# Patient Record
Sex: Male | Born: 1950
Health system: Southern US, Community
[De-identification: ages and names within clinical notes are randomized; demographics above are authoritative.]

## PROBLEM LIST (undated history)

## (undated) DIAGNOSIS — I10 Essential (primary) hypertension: Secondary | ICD-10-CM

## (undated) DIAGNOSIS — M19049 Primary osteoarthritis, unspecified hand: Secondary | ICD-10-CM

## (undated) DIAGNOSIS — E785 Hyperlipidemia, unspecified: Secondary | ICD-10-CM

## (undated) DIAGNOSIS — Z9889 Other specified postprocedural states: Secondary | ICD-10-CM

## (undated) DIAGNOSIS — T4145XA Adverse effect of unspecified anesthetic, initial encounter: Secondary | ICD-10-CM

## (undated) DIAGNOSIS — H9319 Tinnitus, unspecified ear: Secondary | ICD-10-CM

## (undated) DIAGNOSIS — T8859XA Other complications of anesthesia, initial encounter: Secondary | ICD-10-CM

## (undated) DIAGNOSIS — N529 Male erectile dysfunction, unspecified: Secondary | ICD-10-CM

## (undated) DIAGNOSIS — M6530 Trigger finger, unspecified finger: Secondary | ICD-10-CM

## (undated) DIAGNOSIS — Z87442 Personal history of urinary calculi: Secondary | ICD-10-CM

## (undated) DIAGNOSIS — F988 Other specified behavioral and emotional disorders with onset usually occurring in childhood and adolescence: Secondary | ICD-10-CM

## (undated) HISTORY — DX: Primary osteoarthritis, unspecified hand: M19.049

## (undated) HISTORY — DX: Other specified postprocedural states: Z98.890

## (undated) HISTORY — DX: Essential (primary) hypertension: I10

## (undated) HISTORY — PX: COLONOSCOPY: SHX5424

## (undated) HISTORY — DX: Other specified behavioral and emotional disorders with onset usually occurring in childhood and adolescence: F98.8

## (undated) HISTORY — DX: Tinnitus, unspecified ear: H93.19

## (undated) HISTORY — DX: Hyperlipidemia, unspecified: E78.5

## (undated) HISTORY — DX: Trigger finger, unspecified finger: M65.30

## (undated) HISTORY — DX: Male erectile dysfunction, unspecified: N52.9

---
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
## 1990-11-02 HISTORY — PX: VASECTOMY: SHX75

## 2005-04-25 ENCOUNTER — Emergency Department (HOSPITAL_COMMUNITY): Admission: EM | Admit: 2005-04-25 | Discharge: 2005-04-25 | Payer: Self-pay | Admitting: Emergency Medicine

## 2006-03-24 ENCOUNTER — Encounter: Payer: Self-pay | Admitting: Family Medicine

## 2009-01-18 ENCOUNTER — Ambulatory Visit: Payer: Self-pay | Admitting: Family Medicine

## 2009-01-18 DIAGNOSIS — I10 Essential (primary) hypertension: Secondary | ICD-10-CM

## 2009-01-18 DIAGNOSIS — E785 Hyperlipidemia, unspecified: Secondary | ICD-10-CM

## 2009-01-18 DIAGNOSIS — M6530 Trigger finger, unspecified finger: Secondary | ICD-10-CM

## 2009-01-18 HISTORY — DX: Hyperlipidemia, unspecified: E78.5

## 2009-01-18 HISTORY — DX: Essential (primary) hypertension: I10

## 2009-01-18 HISTORY — DX: Trigger finger, unspecified finger: M65.30

## 2009-01-22 ENCOUNTER — Telehealth: Payer: Self-pay | Admitting: Family Medicine

## 2009-05-23 ENCOUNTER — Telehealth: Payer: Self-pay | Admitting: Family Medicine

## 2009-06-26 ENCOUNTER — Ambulatory Visit: Payer: Self-pay | Admitting: Family Medicine

## 2009-06-26 DIAGNOSIS — H9319 Tinnitus, unspecified ear: Secondary | ICD-10-CM

## 2009-06-26 HISTORY — DX: Tinnitus, unspecified ear: H93.19

## 2010-01-17 ENCOUNTER — Ambulatory Visit: Payer: Self-pay | Admitting: Family Medicine

## 2010-01-17 DIAGNOSIS — F988 Other specified behavioral and emotional disorders with onset usually occurring in childhood and adolescence: Secondary | ICD-10-CM

## 2010-01-17 HISTORY — DX: Other specified behavioral and emotional disorders with onset usually occurring in childhood and adolescence: F98.8

## 2010-01-21 ENCOUNTER — Telehealth: Payer: Self-pay | Admitting: Family Medicine

## 2010-04-17 ENCOUNTER — Ambulatory Visit: Payer: Self-pay | Admitting: Family Medicine

## 2010-04-17 DIAGNOSIS — M19049 Primary osteoarthritis, unspecified hand: Secondary | ICD-10-CM

## 2010-04-17 HISTORY — DX: Primary osteoarthritis, unspecified hand: M19.049

## 2010-10-01 ENCOUNTER — Ambulatory Visit: Payer: Self-pay | Admitting: Family Medicine

## 2010-10-01 DIAGNOSIS — N529 Male erectile dysfunction, unspecified: Secondary | ICD-10-CM | POA: Insufficient documentation

## 2010-10-01 HISTORY — DX: Male erectile dysfunction, unspecified: N52.9

## 2010-11-04 ENCOUNTER — Ambulatory Visit: Admit: 2010-11-04 | Payer: Self-pay | Admitting: Family Medicine

## 2010-11-28 ENCOUNTER — Telehealth: Payer: Self-pay | Admitting: Family Medicine

## 2010-12-02 NOTE — Assessment & Plan Note (Signed)
Summary: consult re: ? ADHD/cjr   Vital Signs:  Patient profile:   60 year old male Temp:     98.3 degrees F oral BP sitting:   142 / 82  (left arm) Cuff size:   regular  Vitals Entered By: Sid Falcon LPN (January 17, 2010 1:31 PM) CC: Consultation ADHD   History of Present Illness: Patient here to discuss possible ADD issues. Since early childhood he recalls difficulty with schoolwork. He relates difficulty giving close attention to details, frequent careless mistakes, difficulty sustaining attention in tasks, difficulty listening, difficulty following through on instructions, difficulty organizing and completing tasks and frequent avoidance of tasks that require sustained mental effort. He frequently forgets things. No significant hyperactivity symptoms.  He has a job in Air[employer] and also with [employer] and increasingly having difficulty with work. He has noticed impairment in multiple settings. Hx of difficulties in school because of similar issues.  Does not does have history of high blood pressure and he has been taking losartan 50 mg though he had recent titrated on 100 mg. Most of his home blood pressures are 140 systolic. No headaches, chest pains, dizziness, or shortness of breath.  Allergies: 1)  ! Univasc (Moexipril Hcl) 2)  ! Tolectin  Past History:  Past Medical History: Last updated: 03/01/2009 Hyperlipidemia Hypertension Kidney Stones, 3 episodes Testosterone dificiency  Social History: Last updated: 01/18/2009 Occupation: Married Alcohol use-yes Smoker, not currently PMH reviewed for relevance, PSH reviewed for relevance  Review of Systems  The patient denies anorexia, fever, weight loss, weight gain, chest pain, syncope, dyspnea on exertion, peripheral edema, prolonged cough, headaches, hemoptysis, abdominal pain, melena, hematochezia, severe indigestion/heartburn, and muscle weakness.    Physical Exam  General:  Well-developed,well-nourished,in no  acute distress; alert,appropriate and cooperative throughout examination Mouth:  Oral mucosa and oropharynx without lesions or exudates.  Teeth in good repair. Neck:  No deformities, masses, or tenderness noted. Lungs:  Normal respiratory effort, chest expands symmetrically. Lungs are clear to auscultation, no crackles or wheezes. Heart:  Normal rate and regular rhythm. S1 and S2 normal without gallop, murmur, click, rub or other extra sounds. Extremities:  No clubbing, cyanosis, edema, or deformity noted with normal full range of motion of all joints.   Cervical Nodes:  No lymphadenopathy noted Psych:  normally interactive, good eye contact, not anxious appearing, and not depressed appearing.     Impression & Recommendations:  Problem # 1:  ADD (ICD-314.00) Assessment New Long discussion with patient regarding options. He meets DSM criteria for adult ADD. We discussed possible benefits as well as possible limitations and risk factors with treatment. We have to be extremely cautious given his prior history of elevated blood pressure though currently controlled. Agree to a trial of generic Adderall with very close followup in one month to reassess.  Problem # 2:  HYPERTENSION (ICD-401.9) Assessment: Unchanged  His updated medication list for this problem includes:    Losartan Potassium 100 Mg Tabs (Losartan potassium) ..... One by mouth once daily  Complete Medication List: 1)  Cialis 20 Mg Tabs (Tadalafil) .... One tab by mouth every other day 2)  Losartan Potassium 100 Mg Tabs (Losartan potassium) .... One by mouth once daily 3)  Adderall Xr 20 Mg Xr24h-cap (Amphetamine-dextroamphetamine) .... One by mouth once daily  Patient Instructions: 1)  Increase losartan to 100 mg daily 2)  Please schedule a follow-up appointment in 1 month.  Prescriptions: ADDERALL XR 20 MG XR24H-CAP (AMPHETAMINE-DEXTROAMPHETAMINE) one by mouth once daily  #30  x 0   Entered and Authorized by:   Evelena Peat MD   Signed by:   Evelena Peat MD on 01/17/2010   Method used:   Print then Give to Patient   RxID:   [id number] CIALIS 20 MG TABS (TADALAFIL) one tab by mouth every other day  #30 x 6   Entered and Authorized by:   Evelena Peat MD   Signed by:   Evelena Peat MD on 01/17/2010   Method used:   [facility]  [place] [address] [facility]       [address]       [place], [place]  [address]       Ph: [phone] or [phone]       Fax: [phone]   RxID:   [id number]

## 2010-12-02 NOTE — Letter (Signed)
Summary: Records from [facility] 2001 - 2007  Records from [facility] 2001 - 2007   Imported By: Maryln Gottron 03/13/2009 11:22:05  _____________________________________________________________________  External Attachment:    Type:   Image     Comment:   External Document

## 2010-12-02 NOTE — Progress Notes (Signed)
Summary: requesting cheaper Adderall  Phone Note Call from Patient Call back at [phone]   Caller: Patient---live call Summary of Call: Adderall is too expensive. Are there any cheaper alternatives? Initial call taken by: Warnell Forester,  January 21, 2010 12:36 PM  Follow-up for Phone Call        We need to make sure he got generic Adderall XR.  If generic Adderall XR is too expensive ANY of the other extended release meds for ADD would be similar cost.  The least expensive option is non- extended release Adderall but this would require at least twice daily dosing. Follow-up by: Evelena Peat MD,  January 21, 2010 3:15 PM  Additional Follow-up for Phone Call Additional follow up Details #1::        Pt informed of above on personally identified VM Additional Follow-up by: Sid Falcon LPN,  January 22, 2010 9:43 AM

## 2010-12-02 NOTE — Assessment & Plan Note (Signed)
Summary: hand pain//ccm   Vital Signs:  Patient profile:   60 year old male Weight:      200 pounds Temp:     98.2 degrees F oral BP sitting:   162 / 92  (left arm) Cuff size:   large  Vitals Entered By: Sid Falcon LPN (October 01, 2010 8:49 AM)  Serial Vital Signs/Assessments:  Time      Position  BP       Pulse  Resp  Temp     By                     148/88                         Evelena Peat MD   History of Present Illness: Here for the following issues.  Progressive right hand pain for several months. Pain mostly DIP, PIP and occasionally MCP joints ring and middle finger. No redness or warmth. Associated stiffness. Has taken Mobic but did not seem to be very effective. No history of generalized arthralgias.  Hypertension poorly controlled home readings with several systolics from 150. Diastolics in the 90s. Takes losartan regularly.  Erectile dysfunction. Takes Cialis but difficulty acquiring secondary to cost  Hypertension History:      He denies headache, chest pain, palpitations, dyspnea with exertion, orthopnea, PND, peripheral edema, visual symptoms, neurologic problems, syncope, and side effects from treatment.        Positive major cardiovascular risk factors include male age 29 years old or older, hyperlipidemia, and hypertension.     Allergies: 1)  ! Univasc (Moexipril Hcl) 2)  ! Tolectin 3)  Meloxicam (Meloxicam)  Past History:  Past Medical History: Last updated: 03/01/2009 Hyperlipidemia Hypertension Kidney Stones, 3 episodes Testosterone dificiency  Past Surgical History: Last updated: 03/01/2009 Appendectomy 1969 vasectomy 1992  Family History: Last updated: 01/18/2009 Family History of Arthritis  grandparent Family History High cholesterol  parent, grandparent Family History Hypertension  grandparent Family History of Sudden Death  grandparent  Social History: Last updated: 01/18/2009 Occupation: Married Alcohol  use-yes Smoker, not currently  Review of Systems  The patient denies anorexia, fever, weight loss, weight gain, chest pain, syncope, dyspnea on exertion, peripheral edema, prolonged cough, headaches, hemoptysis, abdominal pain, melena, hematochezia, severe indigestion/heartburn, and muscle weakness.    Physical Exam  General:  Well-developed,well-nourished,in no acute distress; alert,appropriate and cooperative throughout examination Mouth:  Oral mucosa and oropharynx without lesions or exudates.  Teeth in good repair. Neck:  No deformities, masses, or tenderness noted. Lungs:  Normal respiratory effort, chest expands symmetrically. Lungs are clear to auscultation, no crackles or wheezes. Heart:  Normal rate and regular rhythm. S1 and S2 normal without gallop, murmur, click, rub or other extra sounds. Extremities:  patient has some thickening around the joints DIP and PIP joint third and fourth digits right hand. No redness or warmth. Neurologic:  strength normal in all extremities.   Skin:  no suspicious lesions.   Cervical Nodes:  No lymphadenopathy noted   Impression & Recommendations:  Problem # 1:  HYPERTENSION (ICD-401.9) Assessment Deteriorated changed to losartan HCTZ and reassess one month His updated medication list for this problem includes:    Losartan Potassium-hctz 100-12.5 Mg Tabs (Losartan potassium-hctz) ..... One by mouth once daily  Problem # 2:  OSTEOARTHRITIS, HAND (ICD-715.94)  plain x-rays to assess further The following medications were removed from the medication list:    Meloxicam 15 Mg Tabs (  Meloxicam) ..... One by mouth once daily as needed pain His updated medication list for this problem includes:    Diclofenac Sodium 75 Mg Tbec (Diclofenac sodium) ..... One by mouth two times a day with food as needed pain  Orders: T-Hand Right 3 views (73130TC)  Problem # 3:  IMPOTENCE OF ORGANIC ORIGIN (ICD-607.84) samples Cialis 2.5 mg daily given His updated  medication list for this problem includes:    Cialis 20 Mg Tabs (Tadalafil) ..... One tab by mouth every other day  Complete Medication List: 1)  Cialis 20 Mg Tabs (Tadalafil) .... One tab by mouth every other day 2)  Losartan Potassium-hctz 100-12.5 Mg Tabs (Losartan potassium-hctz) .... One by mouth once daily 3)  Diclofenac Sodium 75 Mg Tbec (Diclofenac sodium) .... One by mouth two times a day with food as needed pain  Hypertension Assessment/Plan:      The patient's hypertensive risk group is category B: At least one risk factor (excluding diabetes) with no target organ damage.  Today's blood pressure is 162/92.    Patient Instructions: 1)  Please schedule a follow-up appointment in 1 month.  Prescriptions: DICLOFENAC SODIUM 75 MG TBEC (DICLOFENAC SODIUM) one by mouth two times a day with food as needed pain  #60 x 0   Entered and Authorized by:   Evelena Peat MD   Signed by:   Evelena Peat MD on 10/01/2010   Method used:   [facility]  [place] [address] [facility]       [address]       [place], [place]  [address]       Ph: [phone] or [phone]       Fax: [phone]   RxID:   [id number] LOSARTAN POTASSIUM-HCTZ 100-12.5 MG TABS (LOSARTAN POTASSIUM-HCTZ) one by mouth once daily  #30 x 5   Entered and Authorized by:   Evelena Peat MD   Signed by:   Evelena Peat MD on 10/01/2010   Method used:   [facility]  [place] [address] [facility]       [address]       [place], [place]  [address]       Ph: [phone] or [phone]       Fax: [phone]   RxID:   [id number]    Orders Added: 1)  T-Hand Right 3 views [73130TC] 2)  Est. Patient Level IV [[address]]

## 2010-12-02 NOTE — Assessment & Plan Note (Signed)
Summary: hand pain//ccm   Vital Signs:  Patient profile:   60 year old male Height:      69 inches Weight:      225 pounds BMI:     33.35 Temp:     98.3 degrees F oral BP sitting:   150 / 90  (left arm) Cuff size:   regular CC: Jon Spencer, to establish here, CPX   History of Present Illness: Patient is a pleasant 60 year old gentleman seen as a new patient.  He is right-hand-dominant and is seen with right hand pain. More specifically, he is having some pain involving the index and ring fingers. He has history of trigger finger and this pain is similar. Sometimes the index and ring fingers have to be manually opened.  No recent injury. He has responded favorably to corticosteroid injections in the past  and would like to proceed similarly at this time.   No wrist pain.    Past History:  Past Medical History:    Hyperlipidemia    Hypertension    Kidney Stones, 3 episodes  Past Surgical History:    Appendectomy 1969  Family History:    Family History of Arthritis  grandparent    Family History High cholesterol  parent, grandparent    Family History Hypertension  grandparent    Family History of Sudden Death  grandparent  Social History:    Occupation:    Married    Alcohol use-yes    Smoker, not currently    Occupation:  employed  Review of Systems       patient denies any numbness or weakness on involving the hand.  Physical Exam  General:  patient alert in no distress Heart:  regular rhythm and rate Msk:  right hand is examined. No visible edema or erythema. He has fairly fluid range of motion all joints of the hand. He does have some tenderness in the palm just proximal to the MCP joint of the ring and index finger. There some mild thickening compatible with trigger finger. No evidence for joint inflammation.   Impression & Recommendations:  Problem # 1:  TRIGGER FINGER (ICD-727.03) Trigger fingers involving ring and index fingers of right hand.  After  going over risks and benefits of corticosteroid injection of tendon sheath of involved digits, patient wished to proceed.  No complications with injection.  Consider orthopedic referral if no better in 2 weeks. Orders: Injection, Tendon / Ligament ([address]) Depo- Medrol 80mg  (J1040)  Patient Instructions: 1)  Be in touch if hand pain is not improved over the next week or 2 following injection. 2)  The medication list was reviewed and reconciled.  All changed / newly prescribed medications were explained.  A complete medication list was provided to the patient / caregiver.      Preventive Care Screening  Last Tetanus Booster:    Date:  11/03/2007    Results:  Td   Colonoscopy:    Date:  11/02/2005    Results:  normal

## 2010-12-02 NOTE — Assessment & Plan Note (Signed)
Summary: SEVERE PAIN IN RT HAND FOR 2-3 MONTHS/CJR   Vital Signs:  Patient profile:   60 year old male Temp:     98.7 degrees F oral BP sitting:   150 / 94  (left arm) Cuff size:   large  Vitals Entered By: Sid Falcon LPN (April 17, 2010 4:14 PM) CC: Right hand pain   History of Present Illness: Patient seen with right hand pain past 2-3 months. Pain especially right index and middle finger mostly PIP joint. No injury. He is ambidextrous. Occasional mild swelling. Ibuprofen helps. No injury.  Pain of moderate severity.  No other arthralgias.  Allergies: 1)  ! Univasc (Moexipril Hcl) 2)  ! Tolectin  Past History:  Past Medical History: Last updated: 03/01/2009 Hyperlipidemia Hypertension Kidney Stones, 3 episodes Testosterone dificiency  Review of Systems  The patient denies fever, weight loss, peripheral edema, and muscle weakness.    Physical Exam  General:  Well-developed,well-nourished,in no acute distress; alert,appropriate and cooperative throughout examination Lungs:  Normal respiratory effort, chest expands symmetrically. Lungs are clear to auscultation, no crackles or wheezes. Heart:  normal rate and regular rhythm.   Extremities:  no warmth erythema or visible swelling of the hand or wrist. He has some mild tenderness of the PIP joints of the second and third digits. Minimal tenderness second digit MCP joint. He has trigger finger involving the second digit.  Mild nodular changes DIP and PIP joints of both hands diffusely.   Impression & Recommendations:  Problem # 1:  OSTEOARTHRITIS, HAND (ICD-715.94) reported prior allergy to Tolectin but patient has been able to take Advil without difficulty His updated medication list for this problem includes:    Meloxicam 15 Mg Tabs (Meloxicam) ..... One by mouth once daily as needed pain  Complete Medication List: 1)  Cialis 20 Mg Tabs (Tadalafil) .... One tab by mouth every other day 2)  Losartan Potassium 100 Mg  Tabs (Losartan potassium) .... One by mouth once daily 3)  Adderall Xr 20 Mg Xr24h-cap (Amphetamine-dextroamphetamine) .... One by mouth once daily 4)  Meloxicam 15 Mg Tabs (Meloxicam) .... One by mouth once daily as needed pain  Patient Instructions: 1)  be in touch if pain is not improving over the next couple of weeks with medication Prescriptions: MELOXICAM 15 MG TABS (MELOXICAM) one by mouth once daily as needed pain  #30 x 3   Entered and Authorized by:   Evelena Peat MD   Signed by:   Evelena Peat MD on 04/17/2010   Method used:   [facility]        [facility]  [place] [address] [facility]       [address]       [place], [place]  [address]       Ph: [phone] or [phone]       Fax: [phone]   RxID:   [id number]

## 2010-12-02 NOTE — Progress Notes (Signed)
Summary: Cialis rx request  Phone Note From Pharmacy   Caller: [facility] Call For: Jon Spencer  Summary of Call: Faxed request for refill on Cialis 20mg , take one tab by mouth  [facility] every other day, #30  Initial call taken by: Sid Falcon LPN,  January 22, 2009 8:30 AM  Follow-up for Phone Call        OK to refill for 1 year Follow-up by: Evelena Peat MD,  January 22, 2009 8:34 AM  Additional Follow-up for Phone Call Additional follow up Details #1::        Rx sent electronically to [facility] Additional Follow-up by: Sid Falcon LPN,  January 22, 2009 12:41 PM    New/Updated Medications: CIALIS 20 MG TABS (TADALAFIL) one tab by mouth every other day   Prescriptions: CIALIS 20 MG TABS (TADALAFIL) one tab by mouth every other day  #30 x 6   Entered by:   Sid Falcon LPN   Authorized by:   Evelena Peat MD   Signed by:   Sid Falcon LPN on 16/08/9603   Method used:   [facility]        [facility]  [place] [address]       [address]       [place], [place]  [address]       Ph: [phone] or [phone]       Fax: [phone]   RxID:   [id number]

## 2010-12-04 NOTE — Progress Notes (Signed)
Summary: wants a zpack, denied  Phone Note Call from Patient Call back at [phone]   Caller: Patient Call For: burchette Summary of Call: pt was exposed to flu this morning, he had breakfast with a friend and gave her a hug.  He wants a zpack called in to [facility] so he doesn't get it Initial call taken by: Alfred Levins, CMA,  November 28, 2010 2:35 PM  Follow-up for Phone Call        If she had flu this (Z-max ) will not help to avoid.  If she had been tested and confirmed with Flu Tamiflu 75 mg two times a day for 5 days would help to prophylax. Follow-up by: Evelena Peat MD,  November 28, 2010 3:30 PM  Additional Follow-up for Phone Call Additional follow up Details #1::        LMTCB  Alfred Levins, CMA  November 28, 2010 4:52 PM    Additional Follow-up for Phone Call Additional follow up Details #2::    Pt informed on personally identified VM Follow-up by: Sid Falcon LPN,  November 28, 2010 5:12 PM

## 2011-01-06 ENCOUNTER — Other Ambulatory Visit: Payer: Self-pay | Admitting: *Deleted

## 2011-01-06 DIAGNOSIS — N529 Male erectile dysfunction, unspecified: Secondary | ICD-10-CM

## 2011-01-06 NOTE — Telephone Encounter (Signed)
VM from pt requesting a change from Cialis 20 mg, one tab every other day back to Levitra. He stated he had been on it before, however I do not see record of it in EMR.   Please send to [facility]

## 2011-01-06 NOTE — Telephone Encounter (Signed)
OK to d/c Cialis and start Levitra 20 mg po every other day prn and refill for 6 months, #6

## 2011-01-07 MED ORDER — VARDENAFIL HCL 20 MG PO TABS: 20.0000 mg | ORAL_TABLET | ORAL | Status: DC | PRN

## 2011-03-20 ENCOUNTER — Encounter: Payer: Self-pay | Admitting: Family Medicine

## 2011-03-20 ENCOUNTER — Ambulatory Visit (INDEPENDENT_AMBULATORY_CARE_PROVIDER_SITE_OTHER): Payer: BC Managed Care – PPO | Admitting: Family Medicine

## 2011-03-20 VITALS — BP 142/78 | Temp 98.7°F | Ht 69.0 in | Wt 200.0 lb

## 2011-03-20 DIAGNOSIS — E349 Endocrine disorder, unspecified: Secondary | ICD-10-CM

## 2011-03-20 DIAGNOSIS — E291 Testicular hypofunction: Secondary | ICD-10-CM

## 2011-03-20 DIAGNOSIS — R5383 Other fatigue: Secondary | ICD-10-CM

## 2011-03-20 DIAGNOSIS — R5381 Other malaise: Secondary | ICD-10-CM

## 2011-03-20 DIAGNOSIS — M65312 Trigger thumb, left thumb: Secondary | ICD-10-CM

## 2011-03-20 DIAGNOSIS — M6530 Trigger finger, unspecified finger: Secondary | ICD-10-CM

## 2011-03-20 NOTE — Progress Notes (Signed)
  Subjective:    Patient ID: Jon Spencer, male    DOB: 10-24-1951, 60 y.o.   MRN: [id number]  HPI Patient sent the following issues. Left thumb pain. 2 weeks ago was doing a lot of gardening. Pain started then. No injury. Pain is at the base of the thumb, volar surface.  Has noticed trigger finger. Similar problem with right hand past improved with corticosteroid injection. No weakness or numbness. No wrist or hand pain. Has taken Aleve without much improvement.  Hypertension treated with losartan Hctz.  Blood pressures been fairly well controlled by home readings. No dizziness. Compliant with therapy.  History of hypo-testosterone.  Previously treated with injection several years ago but lost insurance. He would like to consider going back on injection this time. Also family history of hypothyroidism. He request levels. No constipation. Weight is stable. No cold intolerance.   Review of Systems  Constitutional: Positive for fatigue. Negative for fever, chills, appetite change and unexpected weight change.  Respiratory: Negative for cough and shortness of breath.   Cardiovascular: Negative for chest pain, palpitations and leg swelling.  Gastrointestinal: Negative for abdominal pain.  Neurological: Negative for dizziness, syncope, weakness and headaches.  Hematological: Negative for adenopathy.       Objective:   Physical Exam  Constitutional: He appears well-developed and well-nourished.  HENT:  Head: Normocephalic and atraumatic.  Right Ear: External ear normal.  Left Ear: External ear normal.  Mouth/Throat: Oropharynx is clear and moist.  Cardiovascular: Normal rate, regular rhythm and normal heart sounds.   Musculoskeletal: He exhibits no edema.       Left thumb reveals tender nodule at the base of digit. He is tenderness in this region. No erythema or warmth. No skin rash. Full range of motion interphalangeal joint. Full range of motion wrists.          Assessment & Plan:   #1 left trigger thumb.  No relief with nonsteroidals. Discussed risks and benefits of corticosteroid injection patient consented. Using 25-gauge 5/8 inch needle injected approximately 20 mg Depo-Medrol into region of tender nodule (into tendon sheath parallel to nodule and tendon). Patient tolerated well. Touch base 2 weeks if no better #2 fatigue with history of low testosterone. He'll return for early morning testosterone and TSH levels

## 2011-03-24 ENCOUNTER — Other Ambulatory Visit: Payer: Self-pay | Admitting: Family Medicine

## 2011-03-24 ENCOUNTER — Other Ambulatory Visit (INDEPENDENT_AMBULATORY_CARE_PROVIDER_SITE_OTHER): Payer: BC Managed Care – PPO | Admitting: Family Medicine

## 2011-03-24 DIAGNOSIS — R5383 Other fatigue: Secondary | ICD-10-CM

## 2011-03-24 DIAGNOSIS — R5381 Other malaise: Secondary | ICD-10-CM

## 2011-03-24 NOTE — Telephone Encounter (Signed)
Needs status of a refill that was supposed to have been done last week. Wants to speak with Harriett Sine first.

## 2011-04-01 ENCOUNTER — Telehealth: Payer: Self-pay | Admitting: Family Medicine

## 2011-04-01 NOTE — Telephone Encounter (Signed)
Pt needs blood work results °

## 2011-04-02 LAB — TESTOSTERONE: Testosterone: 155.44 ng/dL — ABNORMAL LOW (ref 350.00–890.00)

## 2011-04-02 LAB — TSH: TSH: 1.1 u[IU]/mL (ref 0.35–5.50)

## 2011-04-02 NOTE — Telephone Encounter (Signed)
I called pt, let him know we are working on getting results, not sure what the lab issue is.  I will bring to [facility] and Nana's attention.  Pt very unhappy with the lab service the date of lab service.  Offered to have him discuss with office manager, he said no.

## 2011-04-02 NOTE — Telephone Encounter (Signed)
I do not see results.  Can we find out where his labs are?

## 2011-04-02 NOTE — Telephone Encounter (Signed)
Per Dr Caryl Never, please have Gwenn call pt and explain error in collection, in computer it appears to not have been collected, however pt states he was at lab and collection was done. Gwenn called pt, explained what happened and apologized for error, Pt is coming back in for stat re-draw today. FYI

## 2011-04-02 NOTE — Telephone Encounter (Signed)
Pt requested labs results.

## 2011-04-03 NOTE — Progress Notes (Signed)
Quick Note:  LMTCB on home VM. I also tried work number, he was not working today ______

## 2011-04-07 NOTE — Progress Notes (Signed)
Quick Note:  Pt informed and he would like to begin the testosterone inj again. ______

## 2011-04-08 ENCOUNTER — Telehealth: Payer: Self-pay | Admitting: *Deleted

## 2011-04-08 MED ORDER — TESTOSTERONE CYPIONATE 200 MG/ML IM SOLN: 200.0000 mg | INTRAMUSCULAR | Status: DC

## 2011-04-08 NOTE — Telephone Encounter (Signed)
Pt informed on work VM med will be called in, please call for nurse visit for injection

## 2011-04-08 NOTE — Progress Notes (Signed)
Quick Note:  Pt informed on work VM, will call in med ______

## 2011-04-10 ENCOUNTER — Encounter: Payer: Self-pay | Admitting: Family Medicine

## 2011-04-10 DIAGNOSIS — E291 Testicular hypofunction: Secondary | ICD-10-CM | POA: Insufficient documentation

## 2011-04-17 ENCOUNTER — Other Ambulatory Visit: Payer: Self-pay | Admitting: Family Medicine

## 2011-05-01 ENCOUNTER — Ambulatory Visit: Payer: BC Managed Care – PPO | Admitting: Family Medicine

## 2011-05-01 DIAGNOSIS — Z8639 Personal history of other endocrine, nutritional and metabolic disease: Secondary | ICD-10-CM

## 2011-05-01 MED ORDER — TESTOSTERONE CYPIONATE 200 MG/ML IM SOLN: 200.0000 mg | INTRAMUSCULAR | Status: DC

## 2011-06-02 ENCOUNTER — Ambulatory Visit: Payer: BC Managed Care – PPO | Admitting: Family Medicine

## 2011-06-05 ENCOUNTER — Ambulatory Visit (INDEPENDENT_AMBULATORY_CARE_PROVIDER_SITE_OTHER): Payer: BC Managed Care – PPO | Admitting: Family Medicine

## 2011-06-05 DIAGNOSIS — Z8639 Personal history of other endocrine, nutritional and metabolic disease: Secondary | ICD-10-CM

## 2011-06-05 DIAGNOSIS — Z862 Personal history of diseases of the blood and blood-forming organs and certain disorders involving the immune mechanism: Secondary | ICD-10-CM

## 2011-06-05 MED ORDER — TESTOSTERONE CYPIONATE 200 MG/ML IM SOLN
200.0000 mg | INTRAMUSCULAR | Status: DC
  Administered 2011-06-05: 200 mg via INTRAMUSCULAR

## 2011-07-07 ENCOUNTER — Ambulatory Visit: Payer: BC Managed Care – PPO | Admitting: Family Medicine

## 2011-07-08 ENCOUNTER — Ambulatory Visit (INDEPENDENT_AMBULATORY_CARE_PROVIDER_SITE_OTHER): Payer: BC Managed Care – PPO | Admitting: Family Medicine

## 2011-07-08 DIAGNOSIS — N529 Male erectile dysfunction, unspecified: Secondary | ICD-10-CM

## 2011-07-08 MED ORDER — TESTOSTERONE CYPIONATE 200 MG/ML IM SOLN
200.0000 mg | INTRAMUSCULAR | Status: DC
  Administered 2011-07-08: 200 mg via INTRAMUSCULAR

## 2011-08-05 ENCOUNTER — Ambulatory Visit (INDEPENDENT_AMBULATORY_CARE_PROVIDER_SITE_OTHER): Payer: BC Managed Care – PPO | Admitting: Family Medicine

## 2011-08-05 DIAGNOSIS — Z8639 Personal history of other endocrine, nutritional and metabolic disease: Secondary | ICD-10-CM

## 2011-08-05 DIAGNOSIS — Z862 Personal history of diseases of the blood and blood-forming organs and certain disorders involving the immune mechanism: Secondary | ICD-10-CM

## 2011-08-05 MED ORDER — TESTOSTERONE CYPIONATE 200 MG/ML IM SOLN
200.0000 mg | INTRAMUSCULAR | Status: DC
  Administered 2011-08-05: 200 mg via INTRAMUSCULAR

## 2011-09-11 ENCOUNTER — Ambulatory Visit: Payer: BC Managed Care – PPO | Admitting: Family Medicine

## 2011-09-15 ENCOUNTER — Ambulatory Visit: Payer: BC Managed Care – PPO | Admitting: *Deleted

## 2011-09-16 ENCOUNTER — Ambulatory Visit: Payer: BC Managed Care – PPO | Admitting: *Deleted

## 2011-09-17 ENCOUNTER — Ambulatory Visit (INDEPENDENT_AMBULATORY_CARE_PROVIDER_SITE_OTHER): Payer: BC Managed Care – PPO | Admitting: *Deleted

## 2011-09-17 DIAGNOSIS — Z8639 Personal history of other endocrine, nutritional and metabolic disease: Secondary | ICD-10-CM

## 2011-09-17 DIAGNOSIS — Z862 Personal history of diseases of the blood and blood-forming organs and certain disorders involving the immune mechanism: Secondary | ICD-10-CM

## 2011-09-17 MED ORDER — TESTOSTERONE CYPIONATE 200 MG/ML IM SOLN
200.0000 mg | INTRAMUSCULAR | Status: DC
  Administered 2011-09-17: 200 mg via INTRAMUSCULAR

## 2011-10-02 ENCOUNTER — Encounter: Payer: Self-pay | Admitting: Family Medicine

## 2011-10-02 ENCOUNTER — Ambulatory Visit (INDEPENDENT_AMBULATORY_CARE_PROVIDER_SITE_OTHER): Payer: BC Managed Care – PPO | Admitting: Family Medicine

## 2011-10-02 VITALS — BP 152/92 | Temp 98.0°F | Wt 198.0 lb

## 2011-10-02 DIAGNOSIS — I10 Essential (primary) hypertension: Secondary | ICD-10-CM

## 2011-10-02 DIAGNOSIS — M65319 Trigger thumb, unspecified thumb: Secondary | ICD-10-CM

## 2011-10-02 DIAGNOSIS — N400 Enlarged prostate without lower urinary tract symptoms: Secondary | ICD-10-CM

## 2011-10-02 DIAGNOSIS — M6530 Trigger finger, unspecified finger: Secondary | ICD-10-CM

## 2011-10-02 DIAGNOSIS — E291 Testicular hypofunction: Secondary | ICD-10-CM

## 2011-10-02 DIAGNOSIS — N529 Male erectile dysfunction, unspecified: Secondary | ICD-10-CM

## 2011-10-02 MED ORDER — TADALAFIL 5 MG PO TABS: 5.0000 mg | ORAL_TABLET | Freq: Every day | ORAL | Status: DC | PRN

## 2011-10-02 NOTE — Patient Instructions (Signed)
Trigger Finger Trigger finger (digital tendinitis and stenosing tenosynovitis) is a common disorder that causes an often painful catching of the fingers or thumb. It occurs as a clicking, snapping or locking of a finger in the palm of the hand. The reason for this is that there is a problem with the tendons which flex the fingers sliding smoothly through their sheaths. The cause of this may be inflammation of the tendon and sheath, or from a thickening or nodule in the tendon. The condition may occur in any finger or a couple fingers at the same time. The cause may be overuse while doing the same activity over and over again with your hands.  Tendons are the tough cords that connect the muscles to bones. Muscles and tendons are part of the system which allows your body to move. When muscles contract in the forearm on the palm side, they pull the tendons toward the elbow and cause the fingers and thumb to bend (flex) toward the palm. These are the flexor tendons. The tendons slide through a slippery smooth membrane (synovium) which is called the tendon sheath. The sheaths have areas of tough fibrous tissues surrounding them which hold the tendons close to the bone. These are called pulleys because they work like a pulley. The first pulley is in the palm of the hand near the crease which runs across your palm. If the area of the tendon thickening is near the pulley, the tendon cannot slide smoothly through the pulley and this causes the trigger finger. The finger may lock with the finger curled or suddenly straighten out with a snap. This is more common in patients with rheumatoid arthritis and diabetes. Left untreated, the condition may get worse to the point where the finger becomes locked in flexion, like making a fist, or less commonly locked with the finger straightened out. DIAGNOSIS  Your caregiver will easily make this diagnosis on examination. TREATMENT   Splinting for 6 to 8 weeks of time may be  helpful. Use the splints as your caregiver suggests.   Heat used for twenty minutes at least four times a day followed by ice packs for twenty minutes unless directed otherwise by your caregiver may be helpful. If you find either heat or cold seems to be making the problem worse, quit using them and ask your caregiver for directions.   Cortisone injections along with splinting may speed up recovery. Several injections may be required. Cortisone may give relief after one injection.   Only take over-the-counter or prescription medicines for pain, discomfort, or fever as directed by your caregiver.   Surgery is another treatment that may be used if conservative treatments using injection and splinting does not work. Surgery can be minor without incisions (a cut does not have to be made) and can be done with a needle through the skin. No stitches are needed and most patients may return to work the same day.   Other surgical choices involve an open procedure where the surgeon opens the hand through a small incision (cut) and cuts the pulley so the tendon can again slide smoothly. Your hand will still work fine. This small operation requires stitches and the recovery will be a little longer and the incisions will need to be protected until completely healed. You may have to limit your activities for up to 6 months.   Occupational or hand therapy may be required if there is stiffness remaining in the finger.  RISKS AND COMPLICATIONS Complications are uncommon but  some problems that may occur are:  Recurrence of the trigger finger. This does not mean that the surgery was not well done. It simply means that you may have formed scar tissue following surgery that causes the problem to reoccur.   Infection which could ruin the results of the surgery and can result in a finger which is frozen and can not move normally.   Nerve injury is possible which could result in permanent numbness of one or more fingers.   CARE AFTER SURGERY  Elevate your hand above your heart and use ice as instructed.   Follow instructions regarding finger motion/exercise.   Keep the surgical wound dry for at least 48 hrs or longer if instructed.   Keep your follow-up appointments.   Return to work and normal activities as instructed.  SEEK IMMEDIATE MEDICAL CARE IF:  Your problems are getting worse or you do not obtain relief from the treatment. Document Released: 08/08/2004 Document Revised: 07/01/2011 Document Reviewed: 04/02/2009 [facility] Patient Information 2012 [place], [place].

## 2011-10-02 NOTE — Progress Notes (Signed)
  Subjective:    Patient ID: Jon Spencer, male    DOB: 09/06/1951, 60 y.o.   MRN: [id number]  HPI  Here to discuss several issues as follows  History of hypogonadism. Recently started testosterone injections. Overall feels greatly improved. Improved libido, improved stamina, increased ability to exercise. He has lost considerable amount of body fat already is exercising more regularly. Overall improved sense of well-being. Feels somewhat less depressed. No side effects.  Needs repeat PSA  History of frequent nocturia and slow urinary stream occasionally.  Has previously taken Cialis 20 mg for erectile dysfunction and as noted less urinary frequency and less nocturia with that. He would like to consider daily Cialis.  History of hypertension treated with losartan HCTZ. Occasionally skips doses. Blood pressures consistently 130 systolic by home readings. No headaches or dizziness.  Left thumb pain.  Trigger thumb which catches frequently. Has some pain metacarpophalangeal joint. No erythema or warmth. Has had previous trigger finger other digits which have improved with steroid injection. Requesting the same today.   Review of Systems  Constitutional: Negative for fatigue and unexpected weight change.  Eyes: Negative for visual disturbance.  Respiratory: Negative for cough, chest tightness and shortness of breath.   Cardiovascular: Negative for chest pain, palpitations and leg swelling.  Gastrointestinal: Negative for abdominal pain.  Genitourinary: Positive for frequency. Negative for hematuria.  Neurological: Negative for dizziness, syncope, weakness, light-headedness and headaches.  Psychiatric/Behavioral: Negative for dysphoric mood.       Objective:   Physical Exam  Constitutional: He is oriented to person, place, and time. He appears well-developed and well-nourished. No distress.  Neck: Neck supple.  Cardiovascular: Normal rate and regular rhythm.   Pulmonary/Chest: Effort  normal and breath sounds normal. No respiratory distress. He has no wheezes. He has no rales.  Musculoskeletal: He exhibits no edema.       Left thumb reveals no visible swelling. He has trigger thumb with tender nodule ventral aspect base of thumb. Full range of motion all joints of thumb.  Lymphadenopathy:    He has no cervical adenopathy.  Neurological: He is alert and oriented to person, place, and time.  Psychiatric: He has a normal mood and affect. His behavior is normal.          Assessment & Plan:  #1 hypogonadism. Continue testosterone injections. Needs followup testosterone level which is ordered. Also check PSA. #2 history of erectile dysfunction and some BPH-type symptoms. Start daily Cialis 5 mg #3 hypertension. Elevated today. Well controlled by home readings. Continue close monitoring #4 trigger thumb, left.  Discussed options. Discussed risk and benefits of corticosteroid injection into the vicinity of nodule and patient consented. Prepped left thumb with Betadine. Using 25-gauge 5/8 inch needle injected 20 mg Depo-Medrol into the tendon sheath parallel to nodule region. Patient tolerated well.

## 2011-10-07 ENCOUNTER — Ambulatory Visit (INDEPENDENT_AMBULATORY_CARE_PROVIDER_SITE_OTHER): Payer: BC Managed Care – PPO | Admitting: Family Medicine

## 2011-10-07 DIAGNOSIS — Z8639 Personal history of other endocrine, nutritional and metabolic disease: Secondary | ICD-10-CM

## 2011-10-07 DIAGNOSIS — Z862 Personal history of diseases of the blood and blood-forming organs and certain disorders involving the immune mechanism: Secondary | ICD-10-CM

## 2011-10-07 MED ORDER — TESTOSTERONE CYPIONATE 200 MG/ML IM SOLN
200.0000 mg | INTRAMUSCULAR | Status: DC
  Administered 2011-10-07: 200 mg via INTRAMUSCULAR

## 2011-10-09 ENCOUNTER — Ambulatory Visit: Payer: BC Managed Care – PPO | Admitting: Family Medicine

## 2011-11-05 ENCOUNTER — Ambulatory Visit (INDEPENDENT_AMBULATORY_CARE_PROVIDER_SITE_OTHER): Payer: BC Managed Care – PPO | Admitting: Family Medicine

## 2011-11-05 DIAGNOSIS — Z862 Personal history of diseases of the blood and blood-forming organs and certain disorders involving the immune mechanism: Secondary | ICD-10-CM

## 2011-11-05 DIAGNOSIS — Z8639 Personal history of other endocrine, nutritional and metabolic disease: Secondary | ICD-10-CM

## 2011-11-05 MED ORDER — TESTOSTERONE CYPIONATE 200 MG/ML IM SOLN
200.0000 mg | INTRAMUSCULAR | Status: DC
  Administered 2011-11-05: 200 mg via INTRAMUSCULAR

## 2011-12-08 ENCOUNTER — Ambulatory Visit: Payer: BC Managed Care – PPO | Admitting: Family Medicine

## 2011-12-15 ENCOUNTER — Ambulatory Visit: Payer: BC Managed Care – PPO | Admitting: Family Medicine

## 2012-01-05 ENCOUNTER — Ambulatory Visit: Payer: BC Managed Care – PPO | Admitting: Family Medicine

## 2012-01-12 ENCOUNTER — Ambulatory Visit (INDEPENDENT_AMBULATORY_CARE_PROVIDER_SITE_OTHER): Payer: BC Managed Care – PPO | Admitting: Family Medicine

## 2012-01-12 DIAGNOSIS — Z862 Personal history of diseases of the blood and blood-forming organs and certain disorders involving the immune mechanism: Secondary | ICD-10-CM

## 2012-01-12 DIAGNOSIS — Z8639 Personal history of other endocrine, nutritional and metabolic disease: Secondary | ICD-10-CM

## 2012-01-12 MED ORDER — TESTOSTERONE CYPIONATE 200 MG/ML IM SOLN
200.0000 mg | INTRAMUSCULAR | Status: DC
  Administered 2012-01-12: 200 mg via INTRAMUSCULAR

## 2012-02-04 ENCOUNTER — Ambulatory Visit (INDEPENDENT_AMBULATORY_CARE_PROVIDER_SITE_OTHER): Payer: BC Managed Care – PPO | Admitting: Family Medicine

## 2012-02-04 DIAGNOSIS — E291 Testicular hypofunction: Secondary | ICD-10-CM

## 2012-02-04 DIAGNOSIS — E349 Endocrine disorder, unspecified: Secondary | ICD-10-CM

## 2012-02-04 MED ORDER — TESTOSTERONE CYPIONATE 200 MG/ML IM SOLN
200.0000 mg | INTRAMUSCULAR | Status: DC
  Administered 2012-02-04: 200 mg via INTRAMUSCULAR

## 2012-03-04 ENCOUNTER — Ambulatory Visit: Payer: BC Managed Care – PPO | Admitting: *Deleted

## 2012-03-10 ENCOUNTER — Ambulatory Visit (INDEPENDENT_AMBULATORY_CARE_PROVIDER_SITE_OTHER): Payer: BC Managed Care – PPO | Admitting: Family Medicine

## 2012-03-10 DIAGNOSIS — E291 Testicular hypofunction: Secondary | ICD-10-CM

## 2012-03-10 DIAGNOSIS — E349 Endocrine disorder, unspecified: Secondary | ICD-10-CM

## 2012-03-10 MED ORDER — TESTOSTERONE CYPIONATE 200 MG/ML IM SOLN
200.0000 mg | Freq: Once | INTRAMUSCULAR | Status: AC
  Administered 2012-03-10: 200 mg via INTRAMUSCULAR

## 2012-04-04 ENCOUNTER — Ambulatory Visit: Payer: BC Managed Care – PPO | Admitting: Family Medicine

## 2012-04-05 ENCOUNTER — Ambulatory Visit: Payer: BC Managed Care – PPO | Admitting: Family Medicine

## 2012-04-12 ENCOUNTER — Ambulatory Visit: Payer: BC Managed Care – PPO | Admitting: Family Medicine

## 2012-04-14 ENCOUNTER — Other Ambulatory Visit: Payer: Self-pay | Admitting: Family Medicine

## 2012-04-15 ENCOUNTER — Ambulatory Visit: Payer: BC Managed Care – PPO | Admitting: Family Medicine

## 2012-05-03 ENCOUNTER — Ambulatory Visit: Payer: BC Managed Care – PPO | Admitting: Family Medicine

## 2012-05-03 ENCOUNTER — Ambulatory Visit (INDEPENDENT_AMBULATORY_CARE_PROVIDER_SITE_OTHER): Payer: BC Managed Care – PPO | Admitting: Family Medicine

## 2012-05-03 ENCOUNTER — Encounter: Payer: Self-pay | Admitting: Family Medicine

## 2012-05-03 VITALS — BP 164/100 | Temp 98.0°F | Wt 198.0 lb

## 2012-05-03 DIAGNOSIS — I10 Essential (primary) hypertension: Secondary | ICD-10-CM

## 2012-05-03 MED ORDER — AMLODIPINE BESYLATE 5 MG PO TABS: 5.0000 mg | ORAL_TABLET | Freq: Every day | ORAL | Status: DC

## 2012-05-03 NOTE — Progress Notes (Signed)
  Subjective:    Patient ID: Jon Spencer, male    DOB: May 19, 1951, 61 y.o.   MRN: [id number]  HPI  Patient was here for testosterone injection. Relates blood pressures been running high. Consistently taking losartan HCTZ. Blood pressures around 152-156 systolic and 92 diastolic. Exercising 3 times a week. No regular alcohol use. No chest pains. No dizziness. Previous cough with ACE inhibitor  Past Medical History  Diagnosis Date  . HYPERLIPIDEMIA 01/18/2009  . ADD 01/17/2010  . TINNITUS 06/26/2009  . HYPERTENSION 01/18/2009  . Impotence of organic origin 10/01/2010  . OSTEOARTHRITIS, HAND 04/17/2010  . TRIGGER FINGER 01/18/2009   Past Surgical History  Procedure Date  . Appendectomy 1969  . Vasectomy 1992    reports that he has never smoked. He does not have any smokeless tobacco history on file. His alcohol and drug histories not on file. family history includes Arthritis in his other; Hyperlipidemia in his other; Hypertension in his other; and Sudden death in his other. Allergies  Allergen Reactions  . Meloxicam     REACTION: Flushed, high temp  . Tolectin (Tolmetin Sodium)     Fever 104, breathing problems  . Univasc (Moexipril Hydrochloride)     coughing      Review of Systems  Constitutional: Negative for fatigue.  Eyes: Negative for visual disturbance.  Respiratory: Negative for cough, chest tightness and shortness of breath.   Cardiovascular: Negative for chest pain, palpitations and leg swelling.  Neurological: Negative for dizziness, syncope, weakness, light-headedness and headaches.       Objective:   Physical Exam  Constitutional: He appears well-developed and well-nourished.  HENT:  Right Ear: External ear normal.  Left Ear: External ear normal.  Mouth/Throat: Oropharynx is clear and moist.  Neck: Neck supple. No thyromegaly present.  Cardiovascular: Normal rate and regular rhythm.   Pulmonary/Chest: Effort normal and breath sounds normal. No respiratory  distress. He has no wheezes. He has no rales.  Musculoskeletal: He exhibits no edema.          Assessment & Plan:  Hypertension. Suboptimally controlled. Add amlodipine 5 mg once daily and continue losartan HCTZ. Continue regular aerobic exercise. Reassess blood pressure one month

## 2012-05-08 ENCOUNTER — Other Ambulatory Visit: Payer: Self-pay | Admitting: Family Medicine

## 2012-06-03 ENCOUNTER — Ambulatory Visit: Payer: BC Managed Care – PPO | Admitting: Family Medicine

## 2012-06-09 ENCOUNTER — Ambulatory Visit: Payer: BC Managed Care – PPO | Admitting: Family Medicine

## 2012-06-17 ENCOUNTER — Ambulatory Visit (INDEPENDENT_AMBULATORY_CARE_PROVIDER_SITE_OTHER): Payer: BC Managed Care – PPO | Admitting: Family Medicine

## 2012-06-17 DIAGNOSIS — E291 Testicular hypofunction: Secondary | ICD-10-CM

## 2012-06-17 DIAGNOSIS — N529 Male erectile dysfunction, unspecified: Secondary | ICD-10-CM

## 2012-06-17 MED ORDER — TESTOSTERONE CYPIONATE 100 MG/ML IM SOLN
100.0000 mg | Freq: Once | INTRAMUSCULAR | Status: AC
  Administered 2012-06-17: 100 mg via INTRAMUSCULAR

## 2012-07-15 ENCOUNTER — Ambulatory Visit: Payer: BC Managed Care – PPO | Admitting: Family Medicine

## 2012-08-10 ENCOUNTER — Ambulatory Visit (INDEPENDENT_AMBULATORY_CARE_PROVIDER_SITE_OTHER): Payer: BC Managed Care – PPO | Admitting: Family Medicine

## 2012-08-10 ENCOUNTER — Telehealth: Payer: Self-pay | Admitting: *Deleted

## 2012-08-10 DIAGNOSIS — E349 Endocrine disorder, unspecified: Secondary | ICD-10-CM

## 2012-08-10 DIAGNOSIS — E291 Testicular hypofunction: Secondary | ICD-10-CM

## 2012-08-10 MED ORDER — TESTOSTERONE CYPIONATE 200 MG/ML IM SOLN
200.0000 mg | INTRAMUSCULAR | Status: DC
  Administered 2012-08-10: 200 mg via INTRAMUSCULAR

## 2012-08-10 MED ORDER — LOSARTAN POTASSIUM 100 MG PO TABS: 100.0000 mg | ORAL_TABLET | Freq: Every day | ORAL | Status: DC

## 2012-08-10 NOTE — Telephone Encounter (Signed)
Yes this problem is occuring mid day, afternoon.  He was on plain Losartan before and does not recall having the problem.  Should he go back on the plain without HCTZ?

## 2012-08-10 NOTE — Telephone Encounter (Signed)
Lets try going back on plain losartan 100 mg daily but will need close monitoring of blood pressure

## 2012-08-10 NOTE — Telephone Encounter (Signed)
This could be related to HCTZ partially but if symptoms occurring throughout the day (eg late day and night) probably not related.

## 2012-08-10 NOTE — Telephone Encounter (Signed)
Pt informed and voiced understanding

## 2012-08-10 NOTE — Telephone Encounter (Signed)
Pt here for Testosterone inj, has a concern re:  Losarten HCTZ.  His BP  Has been running in the 130's over low 80's, so he is happy with that.  He is having a problem with urinary urgency to the point of occasional incontinence.  He is wondering if this could be a result of the HCTZ or if he has another issue to deal with?  I offered OV with Dr Caryl Never and he stated he has a very high deductable and requested I send a note to the doctor.

## 2012-09-06 ENCOUNTER — Ambulatory Visit (INDEPENDENT_AMBULATORY_CARE_PROVIDER_SITE_OTHER): Payer: BC Managed Care – PPO | Admitting: Family Medicine

## 2012-09-06 DIAGNOSIS — E291 Testicular hypofunction: Secondary | ICD-10-CM

## 2012-09-06 DIAGNOSIS — E349 Endocrine disorder, unspecified: Secondary | ICD-10-CM

## 2012-09-06 MED ORDER — TESTOSTERONE CYPIONATE 200 MG/ML IM SOLN
200.0000 mg | INTRAMUSCULAR | Status: DC
  Administered 2012-09-06: 200 mg via INTRAMUSCULAR

## 2012-10-04 ENCOUNTER — Ambulatory Visit (INDEPENDENT_AMBULATORY_CARE_PROVIDER_SITE_OTHER): Payer: BC Managed Care – PPO | Admitting: Family Medicine

## 2012-10-04 DIAGNOSIS — E349 Endocrine disorder, unspecified: Secondary | ICD-10-CM

## 2012-10-04 DIAGNOSIS — E291 Testicular hypofunction: Secondary | ICD-10-CM

## 2012-10-04 MED ORDER — TESTOSTERONE CYPIONATE 200 MG/ML IM SOLN
200.0000 mg | INTRAMUSCULAR | Status: DC
  Administered 2012-10-04: 200 mg via INTRAMUSCULAR

## 2012-11-04 ENCOUNTER — Other Ambulatory Visit: Payer: BC Managed Care – PPO

## 2012-11-11 ENCOUNTER — Encounter: Payer: BC Managed Care – PPO | Admitting: Family Medicine

## 2012-11-30 ENCOUNTER — Encounter: Payer: Self-pay | Admitting: Family Medicine

## 2012-11-30 ENCOUNTER — Ambulatory Visit (INDEPENDENT_AMBULATORY_CARE_PROVIDER_SITE_OTHER): Payer: BC Managed Care – PPO | Admitting: Family Medicine

## 2012-11-30 VITALS — BP 140/74 | Temp 99.1°F

## 2012-11-30 DIAGNOSIS — R21 Rash and other nonspecific skin eruption: Secondary | ICD-10-CM

## 2012-11-30 MED ORDER — TRIAMCINOLONE ACETONIDE 0.1 % EX CREA: TOPICAL_CREAM | Freq: Two times a day (BID) | CUTANEOUS | Status: DC

## 2012-11-30 NOTE — Patient Instructions (Addendum)
Limit bathing time Consider nondrying soap such as Dove. Apply moisturizing lotion or triamcinolone after bathing.

## 2012-11-30 NOTE — Progress Notes (Signed)
  Subjective:    Patient ID: Jon Spencer, male    DOB: October 10, 1951, 62 y.o.   MRN: [id number]  HPI  Pruritic rash anterior chest. Started itching about 3-4 days ago and started scratching. Yesterday noticed some rash. Nonspecific excoriated areas. No vesicles. No pustules. Rash only on chest wall. Generally very dry skin. No alleviating factors.  Patient has history of erectile dysfunction. Recently his tried Cialis 20 mg and has not seen much benefit. He thinks he may have some BPH symptoms. He has urine frequency and sometimes urgency he has noted that when he takes Cialis 20 mg for about 2-3 days does not have as much urine urgency or urgency. He would like to consider 5 mg daily Cialis. Denies slow stream  Review of Systems  Constitutional: Negative for appetite change and unexpected weight change.  Respiratory: Negative for cough and shortness of breath.   Genitourinary: Positive for frequency.  Skin: Positive for rash.       Objective:   Physical Exam  Constitutional: He appears well-developed and well-nourished.  Cardiovascular: Normal rate and regular rhythm.   Pulmonary/Chest: Effort normal and breath sounds normal. No respiratory distress. He has no wheezes. He has no rales.  Skin: Rash noted.       Nonspecific excoriated rash anterior chest. No other areas of rash noted. Generally has very dry skin          Assessment & Plan:  Nonspecific rash. Question related to dry skin. Triamcinolone 0.1% cream twice a day. Consider moisturizing soap such as Dove. Avoid prolonged showers.

## 2012-12-01 ENCOUNTER — Other Ambulatory Visit: Payer: Self-pay | Admitting: Family Medicine

## 2012-12-17 ENCOUNTER — Other Ambulatory Visit: Payer: Self-pay

## 2013-01-02 ENCOUNTER — Other Ambulatory Visit: Payer: Self-pay | Admitting: Family Medicine

## 2013-02-07 ENCOUNTER — Ambulatory Visit (INDEPENDENT_AMBULATORY_CARE_PROVIDER_SITE_OTHER): Payer: BC Managed Care – PPO | Admitting: Family Medicine

## 2013-02-07 DIAGNOSIS — Z8639 Personal history of other endocrine, nutritional and metabolic disease: Secondary | ICD-10-CM

## 2013-02-07 DIAGNOSIS — Z862 Personal history of diseases of the blood and blood-forming organs and certain disorders involving the immune mechanism: Secondary | ICD-10-CM

## 2013-02-07 MED ORDER — TESTOSTERONE CYPIONATE 200 MG/ML IM SOLN
200.0000 mg | INTRAMUSCULAR | Status: DC
  Administered 2013-02-07: 200 mg via INTRAMUSCULAR

## 2013-03-03 ENCOUNTER — Ambulatory Visit (INDEPENDENT_AMBULATORY_CARE_PROVIDER_SITE_OTHER): Payer: BC Managed Care – PPO | Admitting: Family Medicine

## 2013-03-03 DIAGNOSIS — E291 Testicular hypofunction: Secondary | ICD-10-CM

## 2013-03-03 MED ORDER — TESTOSTERONE CYPIONATE 200 MG/ML IM SOLN
100.0000 mg | Freq: Once | INTRAMUSCULAR | Status: AC
  Administered 2013-03-03: 100 mg via INTRAMUSCULAR

## 2013-03-31 ENCOUNTER — Ambulatory Visit (INDEPENDENT_AMBULATORY_CARE_PROVIDER_SITE_OTHER): Payer: BC Managed Care – PPO | Admitting: Family Medicine

## 2013-03-31 DIAGNOSIS — E349 Endocrine disorder, unspecified: Secondary | ICD-10-CM

## 2013-03-31 DIAGNOSIS — E291 Testicular hypofunction: Secondary | ICD-10-CM

## 2013-03-31 MED ORDER — TESTOSTERONE CYPIONATE 200 MG/ML IM SOLN
200.0000 mg | INTRAMUSCULAR | Status: DC
  Administered 2013-03-31: 200 mg via INTRAMUSCULAR

## 2013-05-02 ENCOUNTER — Ambulatory Visit (INDEPENDENT_AMBULATORY_CARE_PROVIDER_SITE_OTHER): Payer: BC Managed Care – PPO | Admitting: Family Medicine

## 2013-05-02 DIAGNOSIS — E291 Testicular hypofunction: Secondary | ICD-10-CM

## 2013-05-02 DIAGNOSIS — E349 Endocrine disorder, unspecified: Secondary | ICD-10-CM

## 2013-05-02 MED ORDER — TESTOSTERONE CYPIONATE 200 MG/ML IM SOLN
200.0000 mg | Freq: Once | INTRAMUSCULAR | Status: AC
  Administered 2013-05-02: 200 mg via INTRAMUSCULAR

## 2013-07-14 ENCOUNTER — Ambulatory Visit (INDEPENDENT_AMBULATORY_CARE_PROVIDER_SITE_OTHER): Payer: BC Managed Care – PPO | Admitting: Family Medicine

## 2013-07-14 ENCOUNTER — Telehealth: Payer: Self-pay

## 2013-07-14 DIAGNOSIS — E291 Testicular hypofunction: Secondary | ICD-10-CM

## 2013-07-14 MED ORDER — TESTOSTERONE CYPIONATE 200 MG/ML IM SOLN
200.0000 mg | Freq: Once | INTRAMUSCULAR | Status: AC
  Administered 2013-07-14: 200 mg via INTRAMUSCULAR

## 2013-07-14 MED ORDER — TESTOSTERONE CYPIONATE 200 MG/ML IM SOLN: INTRAMUSCULAR | Status: DC

## 2013-07-14 NOTE — Telephone Encounter (Signed)
Pt wants a refill on his Testosterone 200mg /ml  Last visit 11/30/12 [facility]

## 2013-07-14 NOTE — Telephone Encounter (Signed)
Faxed rx to pharmacy  

## 2013-07-14 NOTE — Telephone Encounter (Signed)
Refill OK

## 2013-09-06 ENCOUNTER — Ambulatory Visit (INDEPENDENT_AMBULATORY_CARE_PROVIDER_SITE_OTHER): Payer: BC Managed Care – PPO | Admitting: Family Medicine

## 2013-09-06 ENCOUNTER — Encounter: Payer: Self-pay | Admitting: Family Medicine

## 2013-09-06 VITALS — BP 132/80 | HR 73 | Temp 98.1°F | Wt 202.0 lb

## 2013-09-06 DIAGNOSIS — Z0000 Encounter for general adult medical examination without abnormal findings: Secondary | ICD-10-CM

## 2013-09-06 DIAGNOSIS — L570 Actinic keratosis: Secondary | ICD-10-CM

## 2013-09-06 DIAGNOSIS — Z23 Encounter for immunization: Secondary | ICD-10-CM

## 2013-09-06 DIAGNOSIS — E291 Testicular hypofunction: Secondary | ICD-10-CM

## 2013-09-06 MED ORDER — TESTOSTERONE CYPIONATE 200 MG/ML IM SOLN
200.0000 mg | Freq: Once | INTRAMUSCULAR | Status: AC
  Administered 2013-09-06: 200 mg via INTRAMUSCULAR

## 2013-09-06 NOTE — Patient Instructions (Signed)
Check on coverage for shingles vaccine. Let me know if interested in scheduling colonoscopy.  Actinic Keratosis Actinic keratosis is a precancerous growth on the skin. This means it could develop into skin cancer if it is not treated. About 1% of actinic keratoses turn into skin cancer within a year. It is important to have all such growths removed to prevent them from developing into skin cancer. CAUSES  Actinic keratosis is caused by getting too much ultraviolet (UV) radiation from the sun or other UV light sources. RISK FACTORS Factors that increase your chances of getting actinic keratosis include  Having light-colored skin and blue eyes.  Having blonde or red hair.  Spending a lot of time in the sun.  Age. The risk of actinic keratosis increases with age. SYMPTOMS  Actinic keratosis growths look like scaly, rough spots of skin. They can be as small as a pinhead or as big as a quarter. They may itch, hurt, or feel sensitive. Sometimes there is a little tag of pink or gray skin growing off them. In some cases, actinic keratoses are easier felt than seen. They do not go away with the use of moisturizing lotions or creams. Actinic keratoses appear most often on areas of skin that get a lot of sun exposure. These areas include the:  Scalp.  Face.  Ears.  Lips.  Upper back.  Backs of the hands.  Forearms. DIAGNOSIS  Your caregiver can usually tell what is wrong by performing a physical exam. A tissue sample (biopsy) may also be taken and examined under a microscope. TREATMENT  Actinic keratosis can be treated several ways. Most treatments can be done in your caregiver's office. Treatment options may include:  Curettage. A tool is used to gently scrape off the growth.  Cryosurgery. Liquid nitrogen is applied to the growth to freeze it. The growth eventually falls off the skin.  Medicated creams, such as 5-fluorouracil or imiquimod. The medicine destroys the cells in the  growth.  Chemical peels. Chemicals are applied to the growth and the outer layers of skin are peeled off.  Photodynamic therapy. A drug that makes your skin more sensitive to light is applied to the skin. A strong, blue light is aimed at the skin and destroys the growth. PREVENTION  To prevent future sun damage:  Try to avoid the sun between 10:00 a.m. and 4:00 p.m. when it is the strongest.  Use a sunscreen or sunblock with SPF 30 or greater.  Apply sunscreen at least 30 minutes before exposure to the sun.  Always wear protective hats, clothing, and sunglasses with UV protection.  Avoid medicines, herbs, and foods that increase your sensitivity to sunlight.  Avoid tanning beds. HOME CARE INSTRUCTIONS   If your skin was covered with a bandage, change and remove the bandage as directed by your caregiver.  Keep the treated area dry as directed by your caregiver.  Apply any creams as prescribed by your caregiver. Follow the directions carefully.  Check your skin regularly for any changes.  Visit a skin doctor (dermatologist) every year for a skin exam. SEEK MEDICAL CARE IF:   Your skin does not heal and becomes irritated, red, or bleeds.  You notice any changes or new growths on your skin. Document Released: 01/15/2009 Document Revised: 01/11/2012 Document Reviewed: 11/30/2011 [facility] Patient Information 2014 [place], [place].

## 2013-09-06 NOTE — Progress Notes (Signed)
Subjective:    Patient ID: Jon Spencer, male    DOB: 17-Aug-1951, 62 y.o.   MRN: [id number]  HPI Patient here for complete physical. No lab work yet. Chronic problems include history of osteoarthritis, hypogonadism, hypertension, hyperlipidemia, and erectile dysfunction Medications reviewed. Blood pressure controlled with amlodipine and losartan. No recent dizziness. He remains on testosterone injections.  No history of shingles vaccine. Colonoscopy about 10 years ago. Tetanus up-to-date. Needs flu vaccine. Nonsmoker.  Past Medical History  Diagnosis Date  . HYPERLIPIDEMIA 01/18/2009  . ADD 01/17/2010  . TINNITUS 06/26/2009  . HYPERTENSION 01/18/2009  . Impotence of organic origin 10/01/2010  . OSTEOARTHRITIS, HAND 04/17/2010  . TRIGGER FINGER 01/18/2009   Past Surgical History  Procedure Laterality Date  . Appendectomy  1969  . Vasectomy  1992    reports that he has never smoked. He does not have any smokeless tobacco history on file. His alcohol and drug histories are not on file. family history includes Arthritis in his other; Hyperlipidemia in his other; Hypertension in his other; Sudden death in his other. Allergies  Allergen Reactions  . Meloxicam     REACTION: Flushed, high temp  . Tolectin [Tolmetin Sodium]     Fever 104, breathing problems  . Univasc [Moexipril Hydrochloride]     coughing      Review of Systems  Constitutional: Negative for fever, activity change, appetite change, fatigue and unexpected weight change.  HENT: Negative for congestion, ear pain and trouble swallowing.   Eyes: Negative for pain and visual disturbance.  Respiratory: Negative for cough, shortness of breath and wheezing.   Cardiovascular: Negative for chest pain and palpitations.  Gastrointestinal: Negative for nausea, vomiting, abdominal pain, diarrhea, constipation, blood in stool, abdominal distention and rectal pain.  Endocrine: Negative for polydipsia and polyuria.   Genitourinary: Negative for dysuria, hematuria and testicular pain.  Musculoskeletal: Negative for arthralgias and joint swelling.  Skin: Negative for rash.  Neurological: Negative for dizziness, syncope and headaches.  Hematological: Negative for adenopathy.  Psychiatric/Behavioral: Negative for confusion and dysphoric mood.       Objective:   Physical Exam  Constitutional: He is oriented to person, place, and time. He appears well-developed and well-nourished. No distress.  HENT:  Head: Normocephalic and atraumatic.  Right Ear: External ear normal.  Left Ear: External ear normal.  Mouth/Throat: Oropharynx is clear and moist.  Eyes: Conjunctivae and EOM are normal. Pupils are equal, round, and reactive to light.  Neck: Normal range of motion. Neck supple. No thyromegaly present.  Cardiovascular: Normal rate, regular rhythm and normal heart sounds.   No murmur heard. Pulmonary/Chest: No respiratory distress. He has no wheezes. He has no rales.  Abdominal: Soft. Bowel sounds are normal. He exhibits no distension and no mass. There is no tenderness. There is no rebound and no guarding.  Genitourinary:  Patient declined  Musculoskeletal: He exhibits no edema.  Lymphadenopathy:    He has no cervical adenopathy.  Neurological: He is alert and oriented to person, place, and time. He displays normal reflexes. No cranial nerve deficit.  Skin: No rash noted.  Dorsum right hand reveals elongated 1 cm x 0.5 cm slightly raised thick scaly area. No ulceration  Psychiatric: He has a normal mood and affect.          Assessment & Plan:  #1 health maintenance. Consider shingles vaccine. He will check on coverage. Recommend repeat colonoscopy is not yet interested. He declines Hemoccult cards. Flu vaccine given. #2 actinic keratosis dorsum right  hand. Discussed risk and benefits of treatment with liquid nitrogen and patient consents. Treated without difficulty. Reassess one month if this is  not smoothing out Michaelle Copas

## 2013-09-07 ENCOUNTER — Other Ambulatory Visit: Payer: BC Managed Care – PPO

## 2013-09-11 ENCOUNTER — Encounter: Payer: Self-pay | Admitting: Family Medicine

## 2013-09-20 ENCOUNTER — Other Ambulatory Visit: Payer: BC Managed Care – PPO

## 2013-09-26 ENCOUNTER — Other Ambulatory Visit (INDEPENDENT_AMBULATORY_CARE_PROVIDER_SITE_OTHER): Payer: BC Managed Care – PPO

## 2013-09-26 DIAGNOSIS — Z0000 Encounter for general adult medical examination without abnormal findings: Secondary | ICD-10-CM

## 2013-09-26 LAB — CBC WITH DIFFERENTIAL/PLATELET
Basophils Absolute: 0 10*3/uL (ref 0.0–0.1)
Basophils Relative: 0.5 % (ref 0.0–3.0)
Eosinophils Absolute: 0.2 10*3/uL (ref 0.0–0.7)
Eosinophils Relative: 2.1 % (ref 0.0–5.0)
HCT: 46.9 % (ref 39.0–52.0)
Hemoglobin: 15.9 g/dL (ref 13.0–17.0)
Lymphocytes Relative: 31.2 % (ref 12.0–46.0)
Lymphs Abs: 2.5 10*3/uL (ref 0.7–4.0)
MCHC: 33.9 g/dL (ref 30.0–36.0)
MCV: 97.5 fl (ref 78.0–100.0)
Monocytes Absolute: 0.5 10*3/uL (ref 0.1–1.0)
Monocytes Relative: 6.1 % (ref 3.0–12.0)
Neutro Abs: 4.9 10*3/uL (ref 1.4–7.7)
Neutrophils Relative %: 60.1 % (ref 43.0–77.0)
Platelets: 217 10*3/uL (ref 150.0–400.0)
RBC: 4.81 Mil/uL (ref 4.22–5.81)
RDW: 14.3 % (ref 11.5–14.6)
WBC: 8.1 10*3/uL (ref 4.5–10.5)

## 2013-09-26 LAB — POCT URINALYSIS DIPSTICK
Bilirubin, UA: NEGATIVE
Blood, UA: NEGATIVE
Glucose, UA: NEGATIVE
Ketones, UA: NEGATIVE
Leukocytes, UA: NEGATIVE
Nitrite, UA: NEGATIVE
Protein, UA: NEGATIVE
Spec Grav, UA: 1.02
Urobilinogen, UA: 0.2
pH, UA: 6.5

## 2013-09-26 LAB — HEPATIC FUNCTION PANEL
ALT: 42 U/L (ref 0–53)
AST: 24 U/L (ref 0–37)
Albumin: 3.9 g/dL (ref 3.5–5.2)
Alkaline Phosphatase: 54 U/L (ref 39–117)
Bilirubin, Direct: 0.1 mg/dL (ref 0.0–0.3)
Total Bilirubin: 0.6 mg/dL (ref 0.3–1.2)
Total Protein: 6.7 g/dL (ref 6.0–8.3)

## 2013-09-26 LAB — LIPID PANEL
Cholesterol: 212 mg/dL — ABNORMAL HIGH (ref 0–200)
HDL: 43 mg/dL (ref >39.00)
Total CHOL/HDL Ratio: 5
Triglycerides: 87 mg/dL (ref 0.0–149.0)
VLDL: 17.4 mg/dL (ref 0.0–40.0)

## 2013-09-26 LAB — BASIC METABOLIC PANEL
BUN: 19 mg/dL (ref 6–23)
CO2: 29 mEq/L (ref 19–32)
Calcium: 9.1 mg/dL (ref 8.4–10.5)
Chloride: 106 mEq/L (ref 96–112)
Creatinine, Ser: 1.3 mg/dL (ref 0.4–1.5)
GFR: 61.8 mL/min (ref >60.00)
Glucose, Bld: 95 mg/dL (ref 70–99)
Potassium: 4.4 mEq/L (ref 3.5–5.1)
Sodium: 139 mEq/L (ref 135–145)

## 2013-09-26 LAB — LDL CHOLESTEROL, DIRECT: Direct LDL: 162.6 mg/dL

## 2013-09-26 LAB — PSA: PSA: 1.66 ng/mL (ref 0.10–4.00)

## 2013-09-26 LAB — TSH: TSH: 1.16 u[IU]/mL (ref 0.35–5.50)

## 2013-10-04 ENCOUNTER — Ambulatory Visit (INDEPENDENT_AMBULATORY_CARE_PROVIDER_SITE_OTHER): Payer: BC Managed Care – PPO | Admitting: Family Medicine

## 2013-10-04 DIAGNOSIS — E291 Testicular hypofunction: Secondary | ICD-10-CM

## 2013-10-04 MED ORDER — TESTOSTERONE CYPIONATE 200 MG/ML IM SOLN
200.0000 mg | Freq: Once | INTRAMUSCULAR | Status: AC
  Administered 2013-10-04: 200 mg via INTRAMUSCULAR

## 2013-10-25 ENCOUNTER — Ambulatory Visit (INDEPENDENT_AMBULATORY_CARE_PROVIDER_SITE_OTHER): Payer: BC Managed Care – PPO | Admitting: Family Medicine

## 2013-10-25 ENCOUNTER — Encounter: Payer: Self-pay | Admitting: Family Medicine

## 2013-10-25 VITALS — BP 140/82 | HR 78 | Temp 98.3°F | Wt 206.0 lb

## 2013-10-25 DIAGNOSIS — R059 Cough, unspecified: Secondary | ICD-10-CM

## 2013-10-25 DIAGNOSIS — R05 Cough: Secondary | ICD-10-CM

## 2013-10-25 MED ORDER — AZITHROMYCIN 250 MG PO TABS
ORAL_TABLET | ORAL | Status: AC
Start: 2013-10-25 — End: 2013-10-30

## 2013-10-25 NOTE — Progress Notes (Signed)
   Subjective:    Patient ID: Jon Spencer, male    DOB: [birth year], 62 y.o.   MRN: [id number]  HPI Acute visit Onset about one half weeks ago cold-like symptoms. Now presents with mostly dry cough. No fever. He is taken cough drops without relief. No postnasal drip symptoms. Cough has worsened over the past few days. He has had some occasional productive sputum. No wheezing. No pleuritic pain. No hemoptysis. Patient nonsmoker.  Patient has history of BPH. He has recently taken Cialis every other day which seems to be helping those symptoms as well.  Past Medical History  Diagnosis Date  . HYPERLIPIDEMIA 01/18/2009  . ADD 01/17/2010  . TINNITUS 06/26/2009  . HYPERTENSION 01/18/2009  . Impotence of organic origin 10/01/2010  . OSTEOARTHRITIS, HAND 04/17/2010  . TRIGGER FINGER 01/18/2009   Past Surgical History  Procedure Laterality Date  . Appendectomy  1969  . Vasectomy  1992    reports that he has never smoked. He does not have any smokeless tobacco history on file. His alcohol and drug histories are not on file. family history includes Arthritis in his other; Hyperlipidemia in his other; Hypertension in his other; Sudden death in his other. Allergies  Allergen Reactions  . Meloxicam     REACTION: Flushed, high temp  . Tolectin [Tolmetin Sodium]     Fever 104, breathing problems  . Univasc [Moexipril Hydrochloride]     coughing      Review of Systems  Constitutional: Negative for fever and chills.  Respiratory: Positive for cough. Negative for shortness of breath and wheezing.   Gastrointestinal: Negative for nausea and vomiting.  Neurological: Negative for dizziness and syncope.       Objective:   Physical Exam  Constitutional: He appears well-developed and well-nourished.  HENT:  Right Ear: External ear normal.  Left Ear: External ear normal.  Mouth/Throat: Oropharynx is clear and moist.  Neck: Neck supple.  Cardiovascular: Normal rate.   Pulmonary/Chest: Effort  normal and breath sounds normal.  Patient has some faint rales right base. No retractions. Normal respiratory rate. Pulse oximetry 96%          Assessment & Plan:  Cough. Patient has no fever but does have some faint rales right base. Start Zithromax. Followup promptly for any increasing fever or dyspnea. Chest x-ray if unimproved over the next week

## 2013-10-25 NOTE — Progress Notes (Signed)
Pre visit review using our clinic review tool, if applicable. No additional management support is needed unless otherwise documented below in the visit note. 

## 2013-10-25 NOTE — Patient Instructions (Signed)
Follow up promptly for any fever or increased shortness of breath. 

## 2013-11-07 ENCOUNTER — Ambulatory Visit (INDEPENDENT_AMBULATORY_CARE_PROVIDER_SITE_OTHER): Payer: BC Managed Care – PPO | Admitting: Family Medicine

## 2013-11-07 DIAGNOSIS — E291 Testicular hypofunction: Secondary | ICD-10-CM

## 2013-11-07 MED ORDER — TESTOSTERONE CYPIONATE 200 MG/ML IM SOLN
200.0000 mg | Freq: Once | INTRAMUSCULAR | Status: AC
  Administered 2013-11-07: 200 mg via INTRAMUSCULAR

## 2013-11-29 ENCOUNTER — Ambulatory Visit (INDEPENDENT_AMBULATORY_CARE_PROVIDER_SITE_OTHER): Payer: BC Managed Care – PPO | Admitting: Family Medicine

## 2013-11-29 DIAGNOSIS — E291 Testicular hypofunction: Secondary | ICD-10-CM

## 2013-11-29 MED ORDER — TESTOSTERONE CYPIONATE 200 MG/ML IM SOLN
200.0000 mg | Freq: Once | INTRAMUSCULAR | Status: AC
  Administered 2013-11-29: 200 mg via INTRAMUSCULAR

## 2013-12-05 ENCOUNTER — Other Ambulatory Visit: Payer: Self-pay | Admitting: Family Medicine

## 2014-01-04 ENCOUNTER — Other Ambulatory Visit: Payer: Self-pay | Admitting: Family Medicine

## 2014-01-04 ENCOUNTER — Ambulatory Visit (INDEPENDENT_AMBULATORY_CARE_PROVIDER_SITE_OTHER): Payer: BC Managed Care – PPO | Admitting: Family Medicine

## 2014-01-04 DIAGNOSIS — E291 Testicular hypofunction: Secondary | ICD-10-CM

## 2014-01-04 MED ORDER — TESTOSTERONE CYPIONATE 200 MG/ML IM SOLN
200.0000 mg | Freq: Once | INTRAMUSCULAR | Status: AC
  Administered 2014-01-04: 200 mg via INTRAMUSCULAR

## 2014-02-14 ENCOUNTER — Ambulatory Visit (INDEPENDENT_AMBULATORY_CARE_PROVIDER_SITE_OTHER): Payer: BC Managed Care – PPO | Admitting: Family Medicine

## 2014-02-14 DIAGNOSIS — E349 Endocrine disorder, unspecified: Secondary | ICD-10-CM

## 2014-02-14 DIAGNOSIS — E291 Testicular hypofunction: Secondary | ICD-10-CM

## 2014-02-14 MED ORDER — SILDENAFIL CITRATE 50 MG PO TABS: 50.0000 mg | ORAL_TABLET | Freq: Every day | ORAL | Status: DC | PRN

## 2014-02-14 MED ORDER — TESTOSTERONE CYPIONATE 200 MG/ML IM SOLN
200.0000 mg | Freq: Once | INTRAMUSCULAR | Status: AC
  Administered 2014-02-14: 200 mg via INTRAMUSCULAR

## 2014-03-06 ENCOUNTER — Telehealth: Payer: Self-pay

## 2014-03-06 ENCOUNTER — Ambulatory Visit (INDEPENDENT_AMBULATORY_CARE_PROVIDER_SITE_OTHER): Payer: BC Managed Care – PPO | Admitting: Family Medicine

## 2014-03-06 DIAGNOSIS — Z862 Personal history of diseases of the blood and blood-forming organs and certain disorders involving the immune mechanism: Secondary | ICD-10-CM

## 2014-03-06 DIAGNOSIS — Z8639 Personal history of other endocrine, nutritional and metabolic disease: Secondary | ICD-10-CM

## 2014-03-06 MED ORDER — TESTOSTERONE CYPIONATE 200 MG/ML IM SOLN
200.0000 mg | Freq: Once | INTRAMUSCULAR | Status: AC
  Administered 2014-03-06: 200 mg via INTRAMUSCULAR

## 2014-03-06 MED ORDER — SILDENAFIL CITRATE 100 MG PO TABS: 100.0000 mg | ORAL_TABLET | Freq: Every day | ORAL | Status: DC | PRN

## 2014-03-06 NOTE — Telephone Encounter (Signed)
Pt wants to know if he can change the dosage of his viagra from 50 mg to 100 mg?

## 2014-03-06 NOTE — Telephone Encounter (Signed)
Pt aware that RX is ready for pick up. Pt did drop off viagra 50 mg.

## 2014-03-06 NOTE — Telephone Encounter (Signed)
yes

## 2014-03-28 ENCOUNTER — Telehealth: Payer: Self-pay

## 2014-03-28 ENCOUNTER — Ambulatory Visit (INDEPENDENT_AMBULATORY_CARE_PROVIDER_SITE_OTHER): Payer: BC Managed Care – PPO | Admitting: Family Medicine

## 2014-03-28 DIAGNOSIS — E291 Testicular hypofunction: Secondary | ICD-10-CM

## 2014-03-28 DIAGNOSIS — E349 Endocrine disorder, unspecified: Secondary | ICD-10-CM

## 2014-03-28 MED ORDER — TESTOSTERONE CYPIONATE 200 MG/ML IM SOLN
200.0000 mg | Freq: Once | INTRAMUSCULAR | Status: AC
  Administered 2014-03-28: 200 mg via INTRAMUSCULAR

## 2014-03-28 MED ORDER — TESTOSTERONE CYPIONATE 200 MG/ML IM SOLN: INTRAMUSCULAR | Status: DC

## 2014-03-28 NOTE — Telephone Encounter (Signed)
Refill OK

## 2014-03-28 NOTE — Telephone Encounter (Signed)
Refill on Testosterone  Last visit 10/25/13 Last refill 07/14/13 53ml 0 refill  [facility]

## 2014-03-28 NOTE — Telephone Encounter (Signed)
Faxed Rx to pharmacy  

## 2014-03-29 ENCOUNTER — Ambulatory Visit: Payer: BC Managed Care – PPO | Admitting: Family Medicine

## 2014-04-20 ENCOUNTER — Ambulatory Visit (INDEPENDENT_AMBULATORY_CARE_PROVIDER_SITE_OTHER): Payer: BC Managed Care – PPO | Admitting: Family Medicine

## 2014-04-20 DIAGNOSIS — E291 Testicular hypofunction: Secondary | ICD-10-CM

## 2014-04-20 DIAGNOSIS — E349 Endocrine disorder, unspecified: Secondary | ICD-10-CM

## 2014-04-20 MED ORDER — TESTOSTERONE CYPIONATE 200 MG/ML IM SOLN
200.0000 mg | Freq: Once | INTRAMUSCULAR | Status: AC
  Administered 2014-04-20: 200 mg via INTRAMUSCULAR

## 2014-06-08 ENCOUNTER — Ambulatory Visit (INDEPENDENT_AMBULATORY_CARE_PROVIDER_SITE_OTHER): Payer: BC Managed Care – PPO | Admitting: Family Medicine

## 2014-06-08 DIAGNOSIS — E291 Testicular hypofunction: Secondary | ICD-10-CM

## 2014-06-08 MED ORDER — TESTOSTERONE CYPIONATE 200 MG/ML IM SOLN
200.0000 mg | Freq: Once | INTRAMUSCULAR | Status: AC
  Administered 2014-06-08: 200 mg via INTRAMUSCULAR

## 2014-07-04 ENCOUNTER — Ambulatory Visit (INDEPENDENT_AMBULATORY_CARE_PROVIDER_SITE_OTHER): Payer: BC Managed Care – PPO | Admitting: Family Medicine

## 2014-07-04 DIAGNOSIS — E291 Testicular hypofunction: Secondary | ICD-10-CM

## 2014-07-04 MED ORDER — TESTOSTERONE CYPIONATE 200 MG/ML IM SOLN
200.0000 mg | Freq: Once | INTRAMUSCULAR | Status: AC
  Administered 2014-07-04: 200 mg via INTRAMUSCULAR

## 2014-07-30 ENCOUNTER — Telehealth: Payer: Self-pay | Admitting: Family Medicine

## 2014-07-30 NOTE — Telephone Encounter (Signed)
Yes

## 2014-07-30 NOTE — Telephone Encounter (Signed)
Pt would like to come on 07/2914 at 0830 for testosterone injection, please advise if it is okay to schedule.

## 2014-07-30 NOTE — Telephone Encounter (Signed)
done

## 2014-07-31 ENCOUNTER — Ambulatory Visit: Payer: BC Managed Care – PPO | Admitting: Family Medicine

## 2014-07-31 ENCOUNTER — Ambulatory Visit (INDEPENDENT_AMBULATORY_CARE_PROVIDER_SITE_OTHER): Payer: BC Managed Care – PPO | Admitting: Family Medicine

## 2014-07-31 DIAGNOSIS — E291 Testicular hypofunction: Secondary | ICD-10-CM

## 2014-07-31 MED ORDER — TESTOSTERONE CYPIONATE 200 MG/ML IM SOLN
200.0000 mg | Freq: Once | INTRAMUSCULAR | Status: AC
  Administered 2014-07-31: 200 mg via INTRAMUSCULAR

## 2014-08-16 ENCOUNTER — Telehealth: Payer: Self-pay | Admitting: Family Medicine

## 2014-08-16 NOTE — Telephone Encounter (Signed)
Pt states Dr Elease Hashimoto prescribed him mobic in the past. He has hurt his lower back would like the mobic refilled again. Pt visiting in [place] [employer]. [facility]

## 2014-08-16 NOTE — Telephone Encounter (Signed)
Pt informed that Dr. B is not here and that i dont see that the patient has taking the medication in the past.

## 2014-08-17 ENCOUNTER — Other Ambulatory Visit: Payer: Self-pay

## 2014-08-28 ENCOUNTER — Ambulatory Visit (INDEPENDENT_AMBULATORY_CARE_PROVIDER_SITE_OTHER): Payer: BC Managed Care – PPO | Admitting: Family Medicine

## 2014-08-28 DIAGNOSIS — E291 Testicular hypofunction: Secondary | ICD-10-CM

## 2014-08-28 MED ORDER — TESTOSTERONE CYPIONATE 200 MG/ML IM SOLN
200.0000 mg | Freq: Once | INTRAMUSCULAR | Status: AC
Start: 2014-08-28 — End: 2014-08-28
  Administered 2014-08-28: 200 mg via INTRAMUSCULAR

## 2014-09-24 ENCOUNTER — Ambulatory Visit: Payer: BC Managed Care – PPO | Admitting: Family Medicine

## 2014-09-25 ENCOUNTER — Ambulatory Visit (INDEPENDENT_AMBULATORY_CARE_PROVIDER_SITE_OTHER): Payer: BC Managed Care – PPO

## 2014-09-25 ENCOUNTER — Ambulatory Visit (INDEPENDENT_AMBULATORY_CARE_PROVIDER_SITE_OTHER): Payer: BC Managed Care – PPO | Admitting: Family Medicine

## 2014-09-25 DIAGNOSIS — E349 Endocrine disorder, unspecified: Secondary | ICD-10-CM

## 2014-09-25 DIAGNOSIS — Z23 Encounter for immunization: Secondary | ICD-10-CM

## 2014-09-25 DIAGNOSIS — E291 Testicular hypofunction: Secondary | ICD-10-CM

## 2014-09-25 MED ORDER — TADALAFIL 20 MG PO TABS: 20.0000 mg | ORAL_TABLET | ORAL | Status: DC

## 2014-09-25 MED ORDER — SILDENAFIL CITRATE 100 MG PO TABS: 100.0000 mg | ORAL_TABLET | Freq: Every day | ORAL | Status: DC | PRN

## 2014-09-25 MED ORDER — TESTOSTERONE CYPIONATE 200 MG/ML IM SOLN
200.0000 mg | Freq: Once | INTRAMUSCULAR | Status: AC
  Administered 2014-09-25: 200 mg via INTRAMUSCULAR

## 2014-10-15 ENCOUNTER — Other Ambulatory Visit: Payer: Self-pay | Admitting: Family Medicine

## 2014-10-22 ENCOUNTER — Ambulatory Visit (INDEPENDENT_AMBULATORY_CARE_PROVIDER_SITE_OTHER): Payer: BC Managed Care – PPO | Admitting: Family Medicine

## 2014-10-22 DIAGNOSIS — E349 Endocrine disorder, unspecified: Secondary | ICD-10-CM

## 2014-10-22 DIAGNOSIS — E291 Testicular hypofunction: Secondary | ICD-10-CM

## 2014-10-22 MED ORDER — TESTOSTERONE CYPIONATE 200 MG/ML IM SOLN
200.0000 mg | Freq: Once | INTRAMUSCULAR | Status: AC
  Administered 2014-10-22: 200 mg via INTRAMUSCULAR

## 2014-11-16 ENCOUNTER — Ambulatory Visit (INDEPENDENT_AMBULATORY_CARE_PROVIDER_SITE_OTHER): Payer: BLUE CROSS/BLUE SHIELD | Admitting: Family Medicine

## 2014-11-16 DIAGNOSIS — E291 Testicular hypofunction: Secondary | ICD-10-CM

## 2014-11-16 DIAGNOSIS — E349 Endocrine disorder, unspecified: Secondary | ICD-10-CM

## 2014-11-16 MED ORDER — TESTOSTERONE CYPIONATE 200 MG/ML IM SOLN
200.0000 mg | Freq: Once | INTRAMUSCULAR | Status: AC
  Administered 2014-11-16: 200 mg via INTRAMUSCULAR

## 2014-11-28 ENCOUNTER — Encounter: Payer: Self-pay | Admitting: Family Medicine

## 2014-11-28 ENCOUNTER — Ambulatory Visit (INDEPENDENT_AMBULATORY_CARE_PROVIDER_SITE_OTHER): Payer: BLUE CROSS/BLUE SHIELD | Admitting: Family Medicine

## 2014-11-28 VITALS — BP 140/80 | HR 87 | Temp 98.4°F | Wt 204.0 lb

## 2014-11-28 DIAGNOSIS — N528 Other male erectile dysfunction: Secondary | ICD-10-CM

## 2014-11-28 DIAGNOSIS — Z79899 Other long term (current) drug therapy: Secondary | ICD-10-CM

## 2014-11-28 DIAGNOSIS — R350 Frequency of micturition: Secondary | ICD-10-CM

## 2014-11-28 DIAGNOSIS — I10 Essential (primary) hypertension: Secondary | ICD-10-CM

## 2014-11-28 DIAGNOSIS — N529 Male erectile dysfunction, unspecified: Secondary | ICD-10-CM

## 2014-11-28 DIAGNOSIS — E291 Testicular hypofunction: Secondary | ICD-10-CM

## 2014-11-28 LAB — POCT URINALYSIS DIPSTICK
Bilirubin, UA: NEGATIVE
Blood, UA: NEGATIVE
Leukocytes, UA: NEGATIVE
Nitrite, UA: NEGATIVE
Spec Grav, UA: 1.02
Urobilinogen, UA: 0.2
pH, UA: 5.5

## 2014-11-28 MED ORDER — TAMSULOSIN HCL 0.4 MG PO CAPS: 0.4000 mg | ORAL_CAPSULE | Freq: Every day | ORAL | Status: DC

## 2014-11-28 NOTE — Progress Notes (Signed)
Pre visit review using our clinic review tool, if applicable. No additional management support is needed unless otherwise documented below in the visit note. 

## 2014-11-28 NOTE — Progress Notes (Signed)
Subjective:    Patient ID: Jon Spencer, male    DOB: 1951-01-23, 64 y.o.   MRN: [id number]  HPI Patient is seen for multiple issues as follows  Urine frequency especially at night over several months. Occasional slow stream. He does have occasional sense of urgency. Usually gets up about 2 times per night. No burning with urination. No gross hematuria. He has scaled back caffeine use. Previously took Cialis 5 mg daily and noticed symptoms were improved with that though he did not get relief of ED symptoms. Only rarely has slow stream and no significant urine dribbling. He feels that he is able to empty his bladder usually  Erectile dysfunction. Currently takes sildenafil generic. He's tried Cialis previously without success. He does have hypogonadism. He refuses further PSAs stating that he does not want to go through workup for possible false positives. He does feel symptomatically improved following his testosterone injections. He has not had levels in over one year.  Hypertension which is treated with amlodipine and losartan. Blood pressure generally well-controlled.  Past Medical History  Diagnosis Date  . HYPERLIPIDEMIA 01/18/2009  . ADD 01/17/2010  . TINNITUS 06/26/2009  . HYPERTENSION 01/18/2009  . Impotence of organic origin 10/01/2010  . OSTEOARTHRITIS, HAND 04/17/2010  . TRIGGER FINGER 01/18/2009   Past Surgical History  Procedure Laterality Date  . Appendectomy  1969  . Vasectomy  1992    reports that he has never smoked. He does not have any smokeless tobacco history on file. His alcohol and drug histories are not on file. family history includes Arthritis in his other; Hyperlipidemia in his other; Hypertension in his other; Sudden death in his other. Allergies  Allergen Reactions  . Meloxicam     REACTION: Flushed, high temp  . Tolectin [Tolmetin Sodium]     Fever 104, breathing problems  . Univasc [Moexipril Hydrochloride]     coughing      Review of Systems    Constitutional: Positive for fatigue.  Eyes: Negative for visual disturbance.  Respiratory: Negative for cough, chest tightness and shortness of breath.   Cardiovascular: Negative for chest pain, palpitations and leg swelling.  Gastrointestinal: Negative for abdominal pain.  Genitourinary: Positive for urgency and frequency. Negative for hematuria and flank pain.  Neurological: Negative for dizziness, syncope, weakness, light-headedness and headaches.       Objective:   Physical Exam  Constitutional: He appears well-developed and well-nourished.  Cardiovascular: Normal rate and regular rhythm.   Pulmonary/Chest: Effort normal and breath sounds normal. No respiratory distress. He has no wheezes. He has no rales.  Musculoskeletal: He exhibits no edema.  Neurological: He is alert.          Assessment & Plan:  #1 urine frequency. He does have occasional obstructive symptoms and has previously benefited from Cialis 5 mg daily. However, he does not wish to take that because this did not help his erectile dysfunction. He may have component of urine urgency but apparently has some BPH symptoms as well. Trial of Flomax 0.4 mg once daily. Touch base in 2 weeks if not seeing substantial improvement.   urine dipstick unremarkable except for 1+ glucose.. Sending basic metabolic panel for further assessment #2 erectile dysfunction. Continue generic sildenafil #3 hypogonadism. Check CBC and testosterone level. We have strongly advise repeat PSA and patient refuses.  He states that he is concerned about possible false positives. He is aware that but not screening this there is risk of underlying prostate cancer being fueled by  ongoing testosterone therapy

## 2014-11-29 ENCOUNTER — Telehealth: Payer: Self-pay | Admitting: Family Medicine

## 2014-11-29 ENCOUNTER — Ambulatory Visit: Payer: BLUE CROSS/BLUE SHIELD | Admitting: Family Medicine

## 2014-11-29 LAB — BASIC METABOLIC PANEL
BUN: 20 mg/dL (ref 6–23)
CO2: 24 mEq/L (ref 19–32)
Calcium: 9.4 mg/dL (ref 8.4–10.5)
Chloride: 103 mEq/L (ref 96–112)
Creatinine, Ser: 1.17 mg/dL (ref 0.40–1.50)
GFR: 66.45 mL/min (ref >60.00)
Glucose, Bld: 160 mg/dL — ABNORMAL HIGH (ref 70–99)
Potassium: 3.7 mEq/L (ref 3.5–5.1)
Sodium: 139 mEq/L (ref 135–145)

## 2014-11-29 LAB — CBC WITH DIFFERENTIAL/PLATELET
Basophils Absolute: 0 10*3/uL (ref 0.0–0.1)
Basophils Relative: 0.4 % (ref 0.0–3.0)
Eosinophils Absolute: 0.3 10*3/uL (ref 0.0–0.7)
Eosinophils Relative: 2.9 % (ref 0.0–5.0)
HCT: 52.1 % — ABNORMAL HIGH (ref 39.0–52.0)
Hemoglobin: 17.8 g/dL — ABNORMAL HIGH (ref 13.0–17.0)
Lymphocytes Relative: 25.8 % (ref 12.0–46.0)
Lymphs Abs: 2.9 10*3/uL (ref 0.7–4.0)
MCHC: 34.2 g/dL (ref 30.0–36.0)
MCV: 95.6 fl (ref 78.0–100.0)
Monocytes Absolute: 0.7 10*3/uL (ref 0.1–1.0)
Monocytes Relative: 6.4 % (ref 3.0–12.0)
Neutro Abs: 7.2 10*3/uL (ref 1.4–7.7)
Neutrophils Relative %: 64.5 % (ref 43.0–77.0)
Platelets: 240 10*3/uL (ref 150.0–400.0)
RBC: 5.45 Mil/uL (ref 4.22–5.81)
RDW: 14.6 % (ref 11.5–15.5)
WBC: 11.2 10*3/uL — ABNORMAL HIGH (ref 4.0–10.5)

## 2014-11-29 LAB — TESTOSTERONE: Testosterone: 325.38 ng/dL (ref 300.00–890.00)

## 2014-11-29 NOTE — Telephone Encounter (Signed)
emmi emailed °

## 2014-12-03 ENCOUNTER — Telehealth: Payer: Self-pay | Admitting: *Deleted

## 2014-12-03 NOTE — Telephone Encounter (Signed)
Pt is taking losartan, flomax, and sildenafil (REVATIO) 20 MG tablet and he was wondering if that decreases blood pressure.  He admits that he does not check his BP at home but he is asymptomatic.  He just wanted to make it sure it was okay to take all three at the same time.  Also, he would like for Dr Elease Hashimoto to call him back himself.  He has a personal "man" question to ask him and he would not elaborate what it was

## 2014-12-04 NOTE — Telephone Encounter (Signed)
i left message that these meds can be taken together.

## 2014-12-20 ENCOUNTER — Ambulatory Visit (INDEPENDENT_AMBULATORY_CARE_PROVIDER_SITE_OTHER): Payer: BLUE CROSS/BLUE SHIELD | Admitting: Family Medicine

## 2014-12-20 ENCOUNTER — Telehealth: Payer: Self-pay

## 2014-12-20 DIAGNOSIS — E291 Testicular hypofunction: Secondary | ICD-10-CM

## 2014-12-20 MED ORDER — TESTOSTERONE CYPIONATE 200 MG/ML IM SOLN
200.0000 mg | Freq: Once | INTRAMUSCULAR | Status: AC
  Administered 2014-12-20: 200 mg via INTRAMUSCULAR

## 2014-12-20 NOTE — Telephone Encounter (Signed)
Patient wanted to let you know that he will not be taking Flomax he did not like the side effects. Pt would not go into detail about the side effects.

## 2015-01-30 ENCOUNTER — Ambulatory Visit (INDEPENDENT_AMBULATORY_CARE_PROVIDER_SITE_OTHER): Payer: BLUE CROSS/BLUE SHIELD | Admitting: Family Medicine

## 2015-01-30 DIAGNOSIS — E291 Testicular hypofunction: Secondary | ICD-10-CM

## 2015-01-30 MED ORDER — TESTOSTERONE CYPIONATE 200 MG/ML IM SOLN
200.0000 mg | Freq: Once | INTRAMUSCULAR | Status: AC
  Administered 2015-01-30: 200 mg via INTRAMUSCULAR

## 2015-02-04 ENCOUNTER — Telehealth: Payer: Self-pay

## 2015-02-04 MED ORDER — TESTOSTERONE CYPIONATE 200 MG/ML IM SOLN: INTRAMUSCULAR | Status: DC

## 2015-02-04 NOTE — Telephone Encounter (Signed)
Rx sent to pharmacy   

## 2015-02-04 NOTE — Telephone Encounter (Signed)
Testosterone  Last visit 11/28/14 Last refill 03/28/14 26ml 0 refill

## 2015-02-04 NOTE — Addendum Note (Signed)
Addended by: Marcina Millard on: 02/04/2015 01:36 PM   Modules accepted: Orders

## 2015-02-04 NOTE — Telephone Encounter (Signed)
Refill OK

## 2015-02-22 ENCOUNTER — Ambulatory Visit (INDEPENDENT_AMBULATORY_CARE_PROVIDER_SITE_OTHER): Payer: BLUE CROSS/BLUE SHIELD | Admitting: Family Medicine

## 2015-02-22 DIAGNOSIS — E291 Testicular hypofunction: Secondary | ICD-10-CM | POA: Diagnosis not present

## 2015-02-22 MED ORDER — TESTOSTERONE CYPIONATE 100 MG/ML IM SOLN
200.0000 mg | Freq: Once | INTRAMUSCULAR | Status: AC
  Administered 2015-02-22: 200 mg via INTRAMUSCULAR

## 2015-03-18 ENCOUNTER — Other Ambulatory Visit: Payer: Self-pay | Admitting: Family Medicine

## 2015-03-18 ENCOUNTER — Other Ambulatory Visit: Payer: BLUE CROSS/BLUE SHIELD

## 2015-03-25 ENCOUNTER — Ambulatory Visit (INDEPENDENT_AMBULATORY_CARE_PROVIDER_SITE_OTHER): Payer: BLUE CROSS/BLUE SHIELD | Admitting: Family Medicine

## 2015-03-25 DIAGNOSIS — E291 Testicular hypofunction: Secondary | ICD-10-CM

## 2015-03-25 MED ORDER — TESTOSTERONE CYPIONATE 200 MG/ML IM SOLN
200.0000 mg | Freq: Once | INTRAMUSCULAR | Status: AC
  Administered 2015-03-25: 200 mg via INTRAMUSCULAR

## 2015-04-10 ENCOUNTER — Ambulatory Visit: Payer: BLUE CROSS/BLUE SHIELD | Admitting: Family Medicine

## 2015-04-19 ENCOUNTER — Ambulatory Visit (INDEPENDENT_AMBULATORY_CARE_PROVIDER_SITE_OTHER): Payer: BLUE CROSS/BLUE SHIELD | Admitting: Family Medicine

## 2015-04-19 DIAGNOSIS — E291 Testicular hypofunction: Secondary | ICD-10-CM | POA: Diagnosis not present

## 2015-04-19 MED ORDER — TESTOSTERONE CYPIONATE 200 MG/ML IM SOLN
200.0000 mg | Freq: Once | INTRAMUSCULAR | Status: AC
  Administered 2015-04-19: 200 mg via INTRAMUSCULAR

## 2015-04-23 ENCOUNTER — Telehealth: Payer: Self-pay | Admitting: Family Medicine

## 2015-04-23 NOTE — Telephone Encounter (Signed)
Patient Name: Jon Spencer DOB: 08/29/1951 Initial Comment Caller states he got a Tick bite on Saturday. Yesterday the bump got bigger and red. Nurse Assessment Nurse: Vallery Sa, RN, Cathy Date/Time (Eastern Time): 04/23/2015 8:12:35 AM Confirm and document reason for call. If symptomatic, describe symptoms. ---Caller states he had a tick bite to his lower abdomen about 3 days ago and the area has become more inflamed 2 nights ago. No fever. Has the patient traveled out of the country within the last 30 days? ---No Does the patient require triage? ---Yes Related visit to physician within the last 2 weeks? ---No Does the PT have any chronic conditions? (i.e. diabetes, asthma, etc.) ---Yes List chronic conditions. ---High Blood Pressure Guidelines Guideline Title Affirmed Question Affirmed Notes Tick Bite [1] Red or very tender (to touch) area AND [2] started over 24 hours after the bite Final Disposition User See Physician within [place], RN, Tye [place] Comments Attempted to schedule an appointment. Caller states that he will call back later to check on appointments.

## 2015-04-23 NOTE — Telephone Encounter (Signed)
Pt called back spoke with Juliann Pulse. I informed Juliann Pulse the reason of why the patient was calling back. Juliann Pulse informed patient to set appointment. Pt states that he will do what Triage recommended him to do and watch the location of were the tick bite was at, if the area gets worse patient will make appointment.

## 2015-04-23 NOTE — Telephone Encounter (Signed)
Left message for patient to return call.

## 2015-04-24 ENCOUNTER — Encounter: Payer: Self-pay | Admitting: Family Medicine

## 2015-04-24 ENCOUNTER — Ambulatory Visit (INDEPENDENT_AMBULATORY_CARE_PROVIDER_SITE_OTHER): Payer: BLUE CROSS/BLUE SHIELD | Admitting: Family Medicine

## 2015-04-24 VITALS — BP 132/80 | HR 72 | Temp 97.8°F | Wt 201.0 lb

## 2015-04-24 DIAGNOSIS — S30861A Insect bite (nonvenomous) of abdominal wall, initial encounter: Secondary | ICD-10-CM

## 2015-04-24 DIAGNOSIS — W57XXXA Bitten or stung by nonvenomous insect and other nonvenomous arthropods, initial encounter: Secondary | ICD-10-CM

## 2015-04-24 NOTE — Progress Notes (Signed)
Pre visit review using our clinic review tool, if applicable. No additional management support is needed unless otherwise documented below in the visit note. 

## 2015-04-24 NOTE — Patient Instructions (Signed)
Tick Bite Information Ticks are insects that attach themselves to the skin and draw blood for food. There are various types of ticks. Common types include wood ticks and deer ticks. Most ticks live in shrubs and grassy areas. Ticks can climb onto your body when you make contact with leaves or grass where the tick is waiting. The most common places on the body for ticks to attach themselves are the scalp, neck, armpits, waist, and groin. Most tick bites are harmless, but sometimes ticks carry germs that cause diseases. These germs can be spread to a person during the tick's feeding process. The chance of a disease spreading through a tick bite depends on:   The type of tick.  Time of year.   How long the tick is attached.   Geographic location.  HOW CAN YOU PREVENT TICK BITES? Take these steps to help prevent tick bites when you are outdoors:  Wear protective clothing. Long sleeves and long pants are best.   Wear white clothes so you can see ticks more easily.  Tuck your pant legs into your socks.   If walking on a trail, stay in the middle of the trail to avoid brushing against bushes.  Avoid walking through areas with long grass.  Put insect repellent on all exposed skin and along boot tops, pant legs, and sleeve cuffs.   Check clothing, hair, and skin repeatedly and before going inside.   Brush off any ticks that are not attached.  Take a shower or bath as soon as possible after being outdoors.  WHAT IS THE PROPER WAY TO REMOVE A TICK? Ticks should be removed as soon as possible to help prevent diseases caused by tick bites. 1. If latex gloves are available, put them on before trying to remove a tick.  2. Using fine-point tweezers, grasp the tick as close to the skin as possible. You may also use curved forceps or a tick removal tool. Grasp the tick as close to its head as possible. Avoid grasping the tick on its body. 3. Pull gently with steady upward pressure until  the tick lets go. Do not twist the tick or jerk it suddenly. This may break off the tick's head or mouth parts. 4. Do not squeeze or crush the tick's body. This could force disease-carrying fluids from the tick into your body.  5. After the tick is removed, wash the bite area and your hands with soap and water or other disinfectant such as alcohol. 6. Apply a small amount of antiseptic cream or ointment to the bite site.  7. Wash and disinfect any instruments that were used.  Do not try to remove a tick by applying a hot match, petroleum jelly, or fingernail polish to the tick. These methods do not work and may increase the chances of disease being spread from the tick bite.  WHEN SHOULD YOU SEEK MEDICAL CARE? Contact your health care provider if you are unable to remove a tick from your skin or if a part of the tick breaks off and is stuck in the skin.  After a tick bite, you need to be aware of signs and symptoms that could be related to diseases spread by ticks. Contact your health care provider if you develop any of the following in the days or weeks after the tick bite:  Unexplained fever.  Rash. A circular rash that appears days or weeks after the tick bite may indicate the possibility of Lyme disease. The rash may resemble   a target with a bull's-eye and may occur at a different part of your body than the tick bite.  Redness and swelling in the area of the tick bite.   Tender, swollen lymph glands.   Diarrhea.   Weight loss.   Cough.   Fatigue.   Muscle, joint, or bone pain.   Abdominal pain.   Headache.   Lethargy or a change in your level of consciousness.  Difficulty walking or moving your legs.   Numbness in the legs.   Paralysis.  Shortness of breath.   Confusion.   Repeated vomiting.  Document Released: 10/16/2000 Document Revised: 08/09/2013 Document Reviewed: 03/29/2013 Exit[employer] Patient Information 2015 [employer]. This information is  not intended to replace advice given to you by your health care provider. Make sure you discuss any questions you have with your health care provider.  

## 2015-04-24 NOTE — Progress Notes (Signed)
   Subjective:    Patient ID: Jon Spencer, male    DOB: [birth year], 64 y.o.   MRN: [id number]  HPI Tick bite left lower abdomen. He pulled off a small tick- probably a deer tick about 5 days ago. Some nonspecific malaise yesterday, otherwise no symptoms. No definite fever. No headaches. No other rash. No arthralgias other than his chronic joint pains.  Past Medical History  Diagnosis Date  . HYPERLIPIDEMIA 01/18/2009  . ADD 01/17/2010  . TINNITUS 06/26/2009  . HYPERTENSION 01/18/2009  . Impotence of organic origin 10/01/2010  . OSTEOARTHRITIS, HAND 04/17/2010  . TRIGGER FINGER 01/18/2009   Past Surgical History  Procedure Laterality Date  . Appendectomy  1969  . Vasectomy  1992    reports that he has never smoked. He does not have any smokeless tobacco history on file. His alcohol and drug histories are not on file. family history includes Arthritis in his other; Hyperlipidemia in his other; Hypertension in his other; Sudden death in his other. Allergies  Allergen Reactions  . Meloxicam     REACTION: Flushed, high temp  . Tolectin [Tolmetin Sodium]     Fever 104, breathing problems  . Univasc [Moexipril Hydrochloride]     coughing      Review of Systems  Constitutional: Negative for fever and chills.  Neurological: Negative for headaches.  Hematological: Negative for adenopathy.       Objective:   Physical Exam  Constitutional: He appears well-developed and well-nourished.  Cardiovascular: Normal rate and regular rhythm.   Pulmonary/Chest: Effort normal and breath sounds normal. No respiratory distress. He has no wheezes. He has no rales.  Skin:  Left lower abdomen oval shaped area of erythema approximately 1 x 2 cm. No visible retained tick parts. Nonfluctuant. Nontender. Urticarial slightly raised          Assessment & Plan:  Recent tick bite with local allergic reaction. No visible retained tick parts. Continue anti-histamine and topical hydrocortisone. We  reviewed signs and symptoms of tick fevers including Lyme disease and [place] spotted fever. Follow-up promptly for any concerning symptoms

## 2015-04-29 ENCOUNTER — Other Ambulatory Visit: Payer: Self-pay

## 2015-05-17 ENCOUNTER — Ambulatory Visit (INDEPENDENT_AMBULATORY_CARE_PROVIDER_SITE_OTHER): Payer: BLUE CROSS/BLUE SHIELD | Admitting: Family Medicine

## 2015-05-17 DIAGNOSIS — E291 Testicular hypofunction: Secondary | ICD-10-CM

## 2015-05-17 MED ORDER — TESTOSTERONE CYPIONATE 100 MG/ML IM SOLN
200.0000 mg | Freq: Once | INTRAMUSCULAR | Status: AC
  Administered 2015-05-17: 200 mg via INTRAMUSCULAR

## 2015-06-05 ENCOUNTER — Telehealth: Payer: Self-pay

## 2015-06-05 ENCOUNTER — Other Ambulatory Visit: Payer: Self-pay | Admitting: Family Medicine

## 2015-06-05 ENCOUNTER — Ambulatory Visit (INDEPENDENT_AMBULATORY_CARE_PROVIDER_SITE_OTHER): Payer: BLUE CROSS/BLUE SHIELD | Admitting: Family Medicine

## 2015-06-05 DIAGNOSIS — E291 Testicular hypofunction: Secondary | ICD-10-CM

## 2015-06-05 DIAGNOSIS — R5383 Other fatigue: Secondary | ICD-10-CM

## 2015-06-05 MED ORDER — TESTOSTERONE CYPIONATE 200 MG/ML IM SOLN
200.0000 mg | Freq: Once | INTRAMUSCULAR | Status: AC
  Administered 2015-06-05: 200 mg via INTRAMUSCULAR

## 2015-06-05 NOTE — Telephone Encounter (Signed)
Pt aware. Lab is ordered.

## 2015-06-05 NOTE — Telephone Encounter (Signed)
Okay to repeat TSH

## 2015-06-05 NOTE — Telephone Encounter (Signed)
Pt states that he has no energy and he would like to have his TSH checked.

## 2015-06-10 ENCOUNTER — Other Ambulatory Visit (INDEPENDENT_AMBULATORY_CARE_PROVIDER_SITE_OTHER): Payer: BLUE CROSS/BLUE SHIELD

## 2015-06-10 DIAGNOSIS — R5383 Other fatigue: Secondary | ICD-10-CM

## 2015-06-10 LAB — TSH: TSH: 1.14 u[IU]/mL (ref 0.35–4.50)

## 2015-07-10 ENCOUNTER — Ambulatory Visit (INDEPENDENT_AMBULATORY_CARE_PROVIDER_SITE_OTHER): Payer: BLUE CROSS/BLUE SHIELD | Admitting: Family Medicine

## 2015-07-10 DIAGNOSIS — E291 Testicular hypofunction: Secondary | ICD-10-CM

## 2015-07-10 MED ORDER — TESTOSTERONE CYPIONATE 200 MG/ML IM SOLN
200.0000 mg | Freq: Once | INTRAMUSCULAR | Status: AC
  Administered 2015-07-10: 200 mg via INTRAMUSCULAR

## 2015-07-31 ENCOUNTER — Ambulatory Visit (INDEPENDENT_AMBULATORY_CARE_PROVIDER_SITE_OTHER): Payer: BLUE CROSS/BLUE SHIELD | Admitting: Family Medicine

## 2015-07-31 DIAGNOSIS — E291 Testicular hypofunction: Secondary | ICD-10-CM | POA: Diagnosis not present

## 2015-07-31 DIAGNOSIS — E349 Endocrine disorder, unspecified: Secondary | ICD-10-CM

## 2015-07-31 MED ORDER — TESTOSTERONE CYPIONATE 200 MG/ML IM SOLN
100.0000 mg | Freq: Once | INTRAMUSCULAR | Status: AC
  Administered 2015-07-31: 100 mg via INTRAMUSCULAR

## 2015-08-30 ENCOUNTER — Ambulatory Visit (INDEPENDENT_AMBULATORY_CARE_PROVIDER_SITE_OTHER): Payer: BLUE CROSS/BLUE SHIELD | Admitting: Family Medicine

## 2015-08-30 DIAGNOSIS — Z23 Encounter for immunization: Secondary | ICD-10-CM

## 2015-08-30 DIAGNOSIS — E291 Testicular hypofunction: Secondary | ICD-10-CM | POA: Diagnosis not present

## 2015-08-30 DIAGNOSIS — E349 Endocrine disorder, unspecified: Secondary | ICD-10-CM

## 2015-08-30 MED ORDER — TESTOSTERONE CYPIONATE 200 MG/ML IM SOLN
100.0000 mg | Freq: Once | INTRAMUSCULAR | Status: AC
  Administered 2015-08-30: 100 mg via INTRAMUSCULAR

## 2015-09-23 ENCOUNTER — Ambulatory Visit: Payer: BLUE CROSS/BLUE SHIELD | Admitting: *Deleted

## 2015-09-24 ENCOUNTER — Ambulatory Visit (INDEPENDENT_AMBULATORY_CARE_PROVIDER_SITE_OTHER): Payer: BLUE CROSS/BLUE SHIELD | Admitting: *Deleted

## 2015-09-24 DIAGNOSIS — E291 Testicular hypofunction: Secondary | ICD-10-CM

## 2015-09-24 DIAGNOSIS — E349 Endocrine disorder, unspecified: Secondary | ICD-10-CM

## 2015-09-24 MED ORDER — TESTOSTERONE CYPIONATE 200 MG/ML IM SOLN
100.0000 mg | Freq: Once | INTRAMUSCULAR | Status: AC
  Administered 2015-09-24: 100 mg via INTRAMUSCULAR

## 2015-09-24 MED ORDER — TESTOSTERONE CYPIONATE 100 MG/ML IM SOLN
100.0000 mg | Freq: Once | INTRAMUSCULAR | Status: DC
Start: 2015-09-24 — End: 2015-09-24

## 2015-10-25 ENCOUNTER — Ambulatory Visit (INDEPENDENT_AMBULATORY_CARE_PROVIDER_SITE_OTHER): Payer: BLUE CROSS/BLUE SHIELD | Admitting: Family Medicine

## 2015-10-25 DIAGNOSIS — E291 Testicular hypofunction: Secondary | ICD-10-CM

## 2015-10-25 MED ORDER — TESTOSTERONE CYPIONATE 100 MG/ML IM SOLN
100.0000 mg | Freq: Once | INTRAMUSCULAR | Status: AC
  Administered 2015-10-25: 100 mg via INTRAMUSCULAR

## 2015-12-11 ENCOUNTER — Ambulatory Visit (INDEPENDENT_AMBULATORY_CARE_PROVIDER_SITE_OTHER): Payer: BLUE CROSS/BLUE SHIELD | Admitting: Family Medicine

## 2015-12-11 DIAGNOSIS — E291 Testicular hypofunction: Secondary | ICD-10-CM | POA: Diagnosis not present

## 2015-12-11 DIAGNOSIS — E349 Endocrine disorder, unspecified: Secondary | ICD-10-CM

## 2015-12-11 MED ORDER — TESTOSTERONE CYPIONATE 200 MG/ML IM SOLN
100.0000 mg | Freq: Once | INTRAMUSCULAR | Status: AC
  Administered 2015-12-11: 100 mg via INTRAMUSCULAR

## 2015-12-16 ENCOUNTER — Other Ambulatory Visit: Payer: Self-pay | Admitting: Family Medicine

## 2016-01-17 ENCOUNTER — Ambulatory Visit (INDEPENDENT_AMBULATORY_CARE_PROVIDER_SITE_OTHER): Payer: BLUE CROSS/BLUE SHIELD | Admitting: Family Medicine

## 2016-01-17 DIAGNOSIS — E291 Testicular hypofunction: Secondary | ICD-10-CM | POA: Diagnosis not present

## 2016-01-17 MED ORDER — TESTOSTERONE CYPIONATE 200 MG/ML IM SOLN
200.0000 mg | INTRAMUSCULAR | Status: DC
  Administered 2016-01-17: 200 mg via INTRAMUSCULAR

## 2016-02-03 ENCOUNTER — Telehealth: Payer: Self-pay | Admitting: Family Medicine

## 2016-02-03 NOTE — Telephone Encounter (Signed)
Pt would like Dr Elease Hashimoto to call him this afternoon. Refused to elaborate, except it will be a real quick call. Thank you.

## 2016-02-04 ENCOUNTER — Telehealth: Payer: Self-pay

## 2016-02-04 ENCOUNTER — Ambulatory Visit (INDEPENDENT_AMBULATORY_CARE_PROVIDER_SITE_OTHER): Payer: BLUE CROSS/BLUE SHIELD | Admitting: Family Medicine

## 2016-02-04 DIAGNOSIS — E291 Testicular hypofunction: Secondary | ICD-10-CM | POA: Diagnosis not present

## 2016-02-04 MED ORDER — TESTOSTERONE CYPIONATE 200 MG/ML IM SOLN
200.0000 mg | INTRAMUSCULAR | Status: DC
  Administered 2016-02-04: 200 mg via INTRAMUSCULAR

## 2016-02-04 NOTE — Telephone Encounter (Signed)
Spoke with patient. Strained his back lifting his mother who had a stroke recently. He initially was requesting meloxicam. However, in looking over his records he's had previous intolerance to meloxicam. He has tolerated Aleve without difficulty. He is instructed to take Aleve 1-2 tablets every 12 hours as needed.

## 2016-02-04 NOTE — Telephone Encounter (Signed)
Pt came in this morning for his testosterone injection. He would like you to personal call him on his cell phone at [phone]. I tried to get a message to give to you but he was not comfortable relaying me the message.

## 2016-02-07 ENCOUNTER — Telehealth: Payer: Self-pay | Admitting: Family Medicine

## 2016-02-07 DIAGNOSIS — N529 Male erectile dysfunction, unspecified: Secondary | ICD-10-CM

## 2016-02-07 NOTE — Telephone Encounter (Signed)
Pt was seen on 02-04-16 and would like to proceed with going to specialist. Pt did not mention who the specialist is

## 2016-02-07 NOTE — Telephone Encounter (Signed)
Urology referral.  I have put this in. Let pt know.  Make sure this is what specialist he was referring to but I'm fairly certain this was it.

## 2016-02-07 NOTE — Telephone Encounter (Signed)
Pt is aware that referral has been placed.  

## 2016-02-07 NOTE — Telephone Encounter (Signed)
Do you know more information about this referral?

## 2016-02-26 ENCOUNTER — Ambulatory Visit: Payer: BLUE CROSS/BLUE SHIELD | Admitting: Family Medicine

## 2016-03-02 ENCOUNTER — Other Ambulatory Visit: Payer: Self-pay | Admitting: Family Medicine

## 2016-03-02 ENCOUNTER — Ambulatory Visit (INDEPENDENT_AMBULATORY_CARE_PROVIDER_SITE_OTHER): Payer: BLUE CROSS/BLUE SHIELD | Admitting: Family Medicine

## 2016-03-02 DIAGNOSIS — E291 Testicular hypofunction: Secondary | ICD-10-CM

## 2016-03-02 MED ORDER — TESTOSTERONE CYPIONATE 200 MG/ML IM SOLN: INTRAMUSCULAR | Status: DC

## 2016-03-02 MED ORDER — TESTOSTERONE CYPIONATE 200 MG/ML IM SOLN
200.0000 mg | INTRAMUSCULAR | Status: DC
  Administered 2016-03-02: 200 mg via INTRAMUSCULAR

## 2016-03-23 ENCOUNTER — Telehealth: Payer: Self-pay | Admitting: Family Medicine

## 2016-03-23 DIAGNOSIS — N5201 Erectile dysfunction due to arterial insufficiency: Secondary | ICD-10-CM | POA: Diagnosis not present

## 2016-03-23 DIAGNOSIS — N401 Enlarged prostate with lower urinary tract symptoms: Secondary | ICD-10-CM | POA: Diagnosis not present

## 2016-03-23 DIAGNOSIS — Z0000 Encounter for general adult medical examination without abnormal findings: Secondary | ICD-10-CM | POA: Diagnosis not present

## 2016-03-23 DIAGNOSIS — N138 Other obstructive and reflux uropathy: Secondary | ICD-10-CM | POA: Diagnosis not present

## 2016-03-23 DIAGNOSIS — R351 Nocturia: Secondary | ICD-10-CM | POA: Diagnosis not present

## 2016-03-23 NOTE — Telephone Encounter (Signed)
Do you want to see the pt or have him call back with bp readings?

## 2016-03-23 NOTE — Telephone Encounter (Signed)
Set up follow up.  Bring in his BP readings.

## 2016-03-23 NOTE — Telephone Encounter (Signed)
Please schedule pt a follow up appt for blood pressure and make sure he brings in his readings. Thank you.

## 2016-03-23 NOTE — Telephone Encounter (Signed)
Patient Name: Jon Spencer  DOB: 1951-04-10    Initial Comment Caller states he was on 2 bp meds for 2-3 years, and has been working. Quit one a couple of months ago because of how it made him feel. At urologist appt, bp was 200/102. Went home and took the secondary med he had not been taking. Has not taken BP since, feels fine.   Nurse Assessment  Nurse: Raphael Gibney, RN, Vanita Ingles Date/Time (Eastern Time): 03/23/2016 12:51:33 PM  Confirm and document reason for call. If symptomatic, describe symptoms. You must click the next button to save text entered. ---Caller states he was on 2 different BP medication 2-3 years. Stopped taking one of his medication as it made him sleepy. BP 140/80 since then. BP at urologist 200/103 today. Had already taken his losartan. Then he took his other medication. he has no pain, no SOB, dizziness.  Has the patient traveled out of the country within the last 30 days? ---Not Applicable  Does the patient have any new or worsening symptoms? ---Yes  Will a triage be completed? ---Yes  Related visit to physician within the last 2 weeks? ---No  Does the PT have any chronic conditions? (i.e. diabetes, asthma, etc.) ---Yes  List chronic conditions. ---HTN  Is this a behavioral health or substance abuse call? ---No     Guidelines    Guideline Title Affirmed Question Affirmed Notes  High Blood Pressure BP ? 160/100    Final Disposition User   See PCP When Office is Open (within 3 days) Raphael Gibney, RN, Vanita Ingles    Comments  Pt states he does not want to [facility] appt at this time. Pt states he will restart both of his BP medications and monitor his BP and call back in 3-4 days with his reading.   Disagree/Comply: Disagree  Disagree/Comply Reason: Wait and see

## 2016-03-24 NOTE — Telephone Encounter (Signed)
Pt has been scheduled. Will bring in bp readings as well.

## 2016-03-25 ENCOUNTER — Other Ambulatory Visit: Payer: Self-pay | Admitting: Family Medicine

## 2016-03-26 DIAGNOSIS — N5201 Erectile dysfunction due to arterial insufficiency: Secondary | ICD-10-CM | POA: Diagnosis not present

## 2016-04-01 ENCOUNTER — Ambulatory Visit: Payer: BLUE CROSS/BLUE SHIELD | Admitting: Family Medicine

## 2016-04-03 ENCOUNTER — Ambulatory Visit: Payer: BLUE CROSS/BLUE SHIELD | Admitting: Family Medicine

## 2016-04-07 ENCOUNTER — Ambulatory Visit (INDEPENDENT_AMBULATORY_CARE_PROVIDER_SITE_OTHER): Payer: BLUE CROSS/BLUE SHIELD | Admitting: Family Medicine

## 2016-04-07 VITALS — BP 160/100 | HR 77 | Temp 98.5°F | Ht 69.0 in | Wt 193.0 lb

## 2016-04-07 DIAGNOSIS — E291 Testicular hypofunction: Secondary | ICD-10-CM

## 2016-04-07 DIAGNOSIS — I10 Essential (primary) hypertension: Secondary | ICD-10-CM | POA: Diagnosis not present

## 2016-04-07 DIAGNOSIS — R7989 Other specified abnormal findings of blood chemistry: Secondary | ICD-10-CM

## 2016-04-07 LAB — BASIC METABOLIC PANEL
BUN: 21 mg/dL (ref 6–23)
CO2: 28 mEq/L (ref 19–32)
Calcium: 9.7 mg/dL (ref 8.4–10.5)
Chloride: 104 mEq/L (ref 96–112)
Creatinine, Ser: 1.33 mg/dL (ref 0.40–1.50)
GFR: 57.08 mL/min — ABNORMAL LOW (ref >60.00)
Glucose, Bld: 124 mg/dL — ABNORMAL HIGH (ref 70–99)
Potassium: 4.2 mEq/L (ref 3.5–5.1)
Sodium: 141 mEq/L (ref 135–145)

## 2016-04-07 LAB — CBC WITH DIFFERENTIAL/PLATELET
Basophils Absolute: 0 10*3/uL (ref 0.0–0.1)
Basophils Relative: 0.6 % (ref 0.0–3.0)
Eosinophils Absolute: 0.2 10*3/uL (ref 0.0–0.7)
Eosinophils Relative: 2.5 % (ref 0.0–5.0)
HCT: 51.2 % (ref 39.0–52.0)
Hemoglobin: 17.4 g/dL — ABNORMAL HIGH (ref 13.0–17.0)
Lymphocytes Relative: 27 % (ref 12.0–46.0)
Lymphs Abs: 2.1 10*3/uL (ref 0.7–4.0)
MCHC: 34 g/dL (ref 30.0–36.0)
MCV: 96.3 fl (ref 78.0–100.0)
Monocytes Absolute: 0.4 10*3/uL (ref 0.1–1.0)
Monocytes Relative: 5 % (ref 3.0–12.0)
Neutro Abs: 5.2 10*3/uL (ref 1.4–7.7)
Neutrophils Relative %: 64.9 % (ref 43.0–77.0)
Platelets: 216 10*3/uL (ref 150.0–400.0)
RBC: 5.32 Mil/uL (ref 4.22–5.81)
RDW: 13 % (ref 11.5–15.5)
WBC: 7.9 10*3/uL (ref 4.0–10.5)

## 2016-04-07 LAB — PSA: PSA: 1.52 ng/mL (ref 0.10–4.00)

## 2016-04-07 LAB — TESTOSTERONE: Testosterone: 148.17 ng/dL — ABNORMAL LOW (ref 300.00–890.00)

## 2016-04-07 MED ORDER — AMLODIPINE BESYLATE 10 MG PO TABS: 10.0000 mg | ORAL_TABLET | Freq: Every day | ORAL | Status: DC

## 2016-04-07 NOTE — Progress Notes (Signed)
   Subjective:    Patient ID: Jon Spencer, male    DOB: [birth year], 65 y.o.   MRN: [id number]  HPI  Patient here for medical follow-up  Hypertension. Recent poor control. Several readings between 123XX123 and 99991111 systolic with diastolics between 80 and 123XX123. He been taking losartan consistently but had stopped taking his amlodipine until 2 weeks ago. Blood pressures have improved slightly since then. No headaches. No chest pains.  Erectile dysfunction. Recently saw a urologist and had blood pressure 200/102 in their office. Recently started injection therapy for erectile dysfunction.  Low testosterone. Has been taking injections monthly but somewhat inconsistent. Needs follow-up lab with PSA and CBC. Digital exam per urologist couple weeks ago. Reportedly no nodules.  Past Medical History  Diagnosis Date  . HYPERLIPIDEMIA 01/18/2009  . ADD 01/17/2010  . TINNITUS 06/26/2009  . HYPERTENSION 01/18/2009  . Impotence of organic origin 10/01/2010  . OSTEOARTHRITIS, HAND 04/17/2010  . TRIGGER FINGER 01/18/2009   Past Surgical History  Procedure Laterality Date  . Appendectomy  1969  . Vasectomy  1992    reports that he has never smoked. He does not have any smokeless tobacco history on file. His alcohol and drug histories are not on file. family history includes Arthritis in his other; Hyperlipidemia in his other; Hypertension in his other; Sudden death in his other. Allergies  Allergen Reactions  . Meloxicam     REACTION: Flushed, high temp  . Tolectin [Tolmetin Sodium]     Fever 104, breathing problems  . Univasc [Moexipril Hydrochloride]     coughing       Review of Systems  Constitutional: Negative for fatigue.  Eyes: Negative for visual disturbance.  Respiratory: Negative for cough, chest tightness and shortness of breath.   Cardiovascular: Negative for chest pain, palpitations and leg swelling.  Endocrine: Negative for polydipsia and polyuria.  Neurological: Negative for  dizziness, syncope, weakness, light-headedness and headaches.       Objective:   Physical Exam  Constitutional: He appears well-developed and well-nourished.  Neck: Neck supple. No thyromegaly present.  Cardiovascular: Normal rate and regular rhythm.  Exam reveals no gallop.   Pulmonary/Chest: Effort normal and breath sounds normal. No respiratory distress. He has no wheezes. He has no rales.  Musculoskeletal: He exhibits no edema.          Assessment & Plan:  #1 hypertension. Suboptimal control. Increase amlodipine to 10 mg daily. Continue losartan. Reassess in one month. Continue weight loss efforts  #2 hypergonadism. Recheck testosterone level, CBC, and PSA. May need to consider going to every 2 week testosterone injection.  Eulas Post MD [facility]

## 2016-04-07 NOTE — Progress Notes (Signed)
Pre visit review using our clinic review tool, if applicable. No additional management support is needed unless otherwise documented below in the visit note. 

## 2016-04-10 NOTE — Addendum Note (Signed)
Addended by: Elio Forget on: 04/10/2016 01:27 PM   Modules accepted: Orders

## 2016-04-13 ENCOUNTER — Other Ambulatory Visit: Payer: Self-pay | Admitting: Family Medicine

## 2016-04-17 ENCOUNTER — Ambulatory Visit (INDEPENDENT_AMBULATORY_CARE_PROVIDER_SITE_OTHER): Payer: BLUE CROSS/BLUE SHIELD | Admitting: Family Medicine

## 2016-04-17 DIAGNOSIS — E291 Testicular hypofunction: Secondary | ICD-10-CM | POA: Diagnosis not present

## 2016-04-17 MED ORDER — TESTOSTERONE CYPIONATE 200 MG/ML IM SOLN
200.0000 mg | Freq: Once | INTRAMUSCULAR | Status: AC
  Administered 2016-04-17: 200 mg via INTRAMUSCULAR

## 2016-05-06 ENCOUNTER — Encounter: Payer: Self-pay | Admitting: Family Medicine

## 2016-05-06 ENCOUNTER — Ambulatory Visit (INDEPENDENT_AMBULATORY_CARE_PROVIDER_SITE_OTHER): Payer: BLUE CROSS/BLUE SHIELD | Admitting: Family Medicine

## 2016-05-06 VITALS — BP 130/70 | HR 76 | Temp 98.1°F | Ht 69.0 in | Wt 192.4 lb

## 2016-05-06 DIAGNOSIS — E291 Testicular hypofunction: Secondary | ICD-10-CM | POA: Diagnosis not present

## 2016-05-06 DIAGNOSIS — I10 Essential (primary) hypertension: Secondary | ICD-10-CM

## 2016-05-06 DIAGNOSIS — D751 Secondary polycythemia: Secondary | ICD-10-CM

## 2016-05-06 MED ORDER — TESTOSTERONE CYPIONATE 200 MG/ML IM SOLN
200.0000 mg | Freq: Once | INTRAMUSCULAR | Status: AC
  Administered 2016-05-06: 200 mg via INTRAMUSCULAR

## 2016-05-06 MED ORDER — TESTOSTERONE CYPIONATE 200 MG/ML IM SOLN: INTRAMUSCULAR | Status: DC

## 2016-05-06 NOTE — Progress Notes (Signed)
   Subjective:    Patient ID: Jon Spencer, male    DOB: 01/09/1951, 65 y.o.   MRN: [id number]  HPI Medical follow-up  Hypertension. Recent poor control. Increased amlodipine to 10 mg. Patient also remains on losartan. Compliant with therapy. No side effects since increasing dose Patient has had consistent blood pressure readings [id number] systolic and 0000000 diastolic. Only rare lightheadedness when standing up suddenly No headaches. No peripheral edema.  Low testosterone. Recent low levels. We increased his frequency of injections to every 2 weeks. He needs repeat injection today. He's had some problems with fatigue. TSH last August was normal. He states that both of his grandmothers presumably had hypothyroidism.  Mild polycythemia with recent hemoglobin 17 but this was stable compared with last year. Hematocrit was 51. Recent blood sugar 124 but this was nonfasting  Past Medical History  Diagnosis Date  . HYPERLIPIDEMIA 01/18/2009  . ADD 01/17/2010  . TINNITUS 06/26/2009  . HYPERTENSION 01/18/2009  . Impotence of organic origin 10/01/2010  . OSTEOARTHRITIS, HAND 04/17/2010  . TRIGGER FINGER 01/18/2009   Past Surgical History  Procedure Laterality Date  . Appendectomy  1969  . Vasectomy  1992    reports that he has never smoked. He does not have any smokeless tobacco history on file. His alcohol and drug histories are not on file. family history includes Arthritis in his other; Hyperlipidemia in his other; Hypertension in his other; Sudden death in his other. Allergies  Allergen Reactions  . Meloxicam     REACTION: Flushed, high temp  . Tolectin [Tolmetin Sodium]     Fever 104, breathing problems  . Univasc [Moexipril Hydrochloride]     coughing      Review of Systems  Constitutional: Positive for fatigue. Negative for appetite change and unexpected weight change.  Eyes: Negative for visual disturbance.  Respiratory: Negative for cough, chest tightness and shortness of  breath.   Cardiovascular: Negative for chest pain, palpitations and leg swelling.  Endocrine: Negative for polydipsia and polyuria.  Neurological: Negative for dizziness, syncope, weakness, light-headedness and headaches.       Objective:   Physical Exam  Constitutional: He appears well-developed and well-nourished.  Neck: Neck supple. No thyromegaly present.  Cardiovascular: Normal rate and regular rhythm.  Exam reveals no gallop.   No murmur heard. Pulmonary/Chest: Effort normal and breath sounds normal. No respiratory distress. He has no wheezes. He has no rales.  Musculoskeletal: He exhibits no edema.  Psychiatric: He has a normal mood and affect. His behavior is normal.          Assessment & Plan:  #1 hypertension. Improved and now at goal. Continue current medications.   Watch sodium intake. Alcohol and moderation. Regular aerobic exercise   #2 low testosterone. Recent low level. We increased frequency of injections to every 2 weeks. Repeat testosterone level in about one month. Injection given today  #3 mild polycythemia.  Possibly related to testosterone therapy. Nonsmoker. No family history of polycythemia vera. We explained we need to follow his CBC at least once yearly and would have to discontinue testosterone for hematocrit over [facility] MD [facility]

## 2016-05-06 NOTE — Progress Notes (Signed)
Pre visit review using our clinic review tool, if applicable. No additional management support is needed unless otherwise documented below in the visit note. 

## 2016-07-08 ENCOUNTER — Ambulatory Visit (INDEPENDENT_AMBULATORY_CARE_PROVIDER_SITE_OTHER): Payer: BLUE CROSS/BLUE SHIELD | Admitting: Family Medicine

## 2016-07-08 DIAGNOSIS — E291 Testicular hypofunction: Secondary | ICD-10-CM

## 2016-07-08 DIAGNOSIS — E349 Endocrine disorder, unspecified: Secondary | ICD-10-CM

## 2016-07-08 MED ORDER — TESTOSTERONE CYPIONATE 200 MG/ML IM SOLN
200.0000 mg | Freq: Once | INTRAMUSCULAR | Status: AC
  Administered 2016-07-08: 200 mg via INTRAMUSCULAR

## 2016-07-08 NOTE — Progress Notes (Signed)
Pre visit review using our clinic review tool, if applicable. No additional management support is needed unless otherwise documented below in the visit note. 

## 2016-07-10 MED ORDER — SILDENAFIL CITRATE 20 MG PO TABS: ORAL_TABLET | ORAL | 1 refills | Status: DC

## 2016-07-10 NOTE — Addendum Note (Signed)
Addended by: Elio Forget on: 07/10/2016 10:00 AM   Modules accepted: Orders

## 2016-07-13 ENCOUNTER — Telehealth: Payer: Self-pay | Admitting: Family Medicine

## 2016-07-13 MED ORDER — SILDENAFIL CITRATE 20 MG PO TABS: ORAL_TABLET | ORAL | 1 refills | Status: DC

## 2016-07-13 NOTE — Telephone Encounter (Signed)
° °  Pt said the following medicine was sent to the wrong pharmacy    sildenafil (REVATIO) 20 MG tablet    It should be sent to [facility] in [facility]

## 2016-07-13 NOTE — Telephone Encounter (Signed)
Medication sent to pharmacy  

## 2016-07-13 NOTE — Telephone Encounter (Signed)
..  lb

## 2016-07-13 NOTE — Telephone Encounter (Signed)
Medication sent in for patient. 

## 2016-08-07 ENCOUNTER — Ambulatory Visit (INDEPENDENT_AMBULATORY_CARE_PROVIDER_SITE_OTHER): Payer: BLUE CROSS/BLUE SHIELD | Admitting: *Deleted

## 2016-08-07 DIAGNOSIS — E349 Endocrine disorder, unspecified: Secondary | ICD-10-CM

## 2016-08-07 MED ORDER — TESTOSTERONE CYPIONATE 200 MG/ML IM SOLN
200.0000 mg | INTRAMUSCULAR | Status: DC
  Administered 2016-08-07: 200 mg via INTRAMUSCULAR

## 2016-09-01 ENCOUNTER — Ambulatory Visit (INDEPENDENT_AMBULATORY_CARE_PROVIDER_SITE_OTHER): Payer: BLUE CROSS/BLUE SHIELD | Admitting: Family Medicine

## 2016-09-01 DIAGNOSIS — E349 Endocrine disorder, unspecified: Secondary | ICD-10-CM | POA: Diagnosis not present

## 2016-09-01 DIAGNOSIS — R7989 Other specified abnormal findings of blood chemistry: Secondary | ICD-10-CM

## 2016-09-01 MED ORDER — TESTOSTERONE CYPIONATE 200 MG/ML IM SOLN: 200.0000 mg | INTRAMUSCULAR | Status: DC

## 2016-09-15 DIAGNOSIS — N5201 Erectile dysfunction due to arterial insufficiency: Secondary | ICD-10-CM | POA: Diagnosis not present

## 2016-09-21 DIAGNOSIS — N5201 Erectile dysfunction due to arterial insufficiency: Secondary | ICD-10-CM | POA: Diagnosis not present

## 2016-10-08 ENCOUNTER — Ambulatory Visit (INDEPENDENT_AMBULATORY_CARE_PROVIDER_SITE_OTHER): Payer: BLUE CROSS/BLUE SHIELD

## 2016-10-08 DIAGNOSIS — N529 Male erectile dysfunction, unspecified: Secondary | ICD-10-CM | POA: Diagnosis not present

## 2016-10-08 MED ORDER — TESTOSTERONE CYPIONATE 200 MG/ML IM SOLN
200.0000 mg | Freq: Once | INTRAMUSCULAR | Status: AC
  Administered 2016-10-08: 200 mg via INTRAMUSCULAR

## 2016-11-12 ENCOUNTER — Ambulatory Visit (INDEPENDENT_AMBULATORY_CARE_PROVIDER_SITE_OTHER): Payer: BLUE CROSS/BLUE SHIELD

## 2016-11-12 DIAGNOSIS — E349 Endocrine disorder, unspecified: Secondary | ICD-10-CM

## 2016-11-12 DIAGNOSIS — R7989 Other specified abnormal findings of blood chemistry: Secondary | ICD-10-CM

## 2016-11-12 MED ORDER — TESTOSTERONE CYPIONATE 200 MG/ML IM SOLN
200.0000 mg | INTRAMUSCULAR | Status: DC
  Administered 2016-11-12: 200 mg via INTRAMUSCULAR

## 2016-11-12 NOTE — Progress Notes (Signed)
Patient had his Testosterone injection on 11/12/2016 at 8.15aam for low testosterone. Given by Rosealee Albee CMA.

## 2016-12-04 ENCOUNTER — Ambulatory Visit (INDEPENDENT_AMBULATORY_CARE_PROVIDER_SITE_OTHER): Payer: BLUE CROSS/BLUE SHIELD

## 2016-12-04 DIAGNOSIS — N529 Male erectile dysfunction, unspecified: Secondary | ICD-10-CM | POA: Diagnosis not present

## 2016-12-04 MED ORDER — TESTOSTERONE CYPIONATE 200 MG/ML IM SOLN
200.0000 mg | INTRAMUSCULAR | Status: DC
  Administered 2016-12-04 – 2017-02-26 (×2): 200 mg via INTRAMUSCULAR

## 2017-01-06 ENCOUNTER — Ambulatory Visit (INDEPENDENT_AMBULATORY_CARE_PROVIDER_SITE_OTHER): Payer: BLUE CROSS/BLUE SHIELD | Admitting: *Deleted

## 2017-01-06 DIAGNOSIS — N529 Male erectile dysfunction, unspecified: Secondary | ICD-10-CM

## 2017-01-06 DIAGNOSIS — E291 Testicular hypofunction: Secondary | ICD-10-CM | POA: Diagnosis not present

## 2017-01-06 MED ORDER — TESTOSTERONE CYPIONATE 200 MG/ML IM SOLN
200.0000 mg | Freq: Once | INTRAMUSCULAR | Status: AC
  Administered 2017-01-06: 200 mg via INTRAMUSCULAR

## 2017-01-28 ENCOUNTER — Ambulatory Visit (INDEPENDENT_AMBULATORY_CARE_PROVIDER_SITE_OTHER): Payer: BLUE CROSS/BLUE SHIELD

## 2017-01-28 DIAGNOSIS — E291 Testicular hypofunction: Secondary | ICD-10-CM

## 2017-01-28 MED ORDER — TESTOSTERONE CYPIONATE 200 MG/ML IM SOLN
200.0000 mg | INTRAMUSCULAR | Status: DC
  Administered 2017-01-28: 200 mg via INTRAMUSCULAR

## 2017-02-26 ENCOUNTER — Ambulatory Visit (INDEPENDENT_AMBULATORY_CARE_PROVIDER_SITE_OTHER): Payer: BLUE CROSS/BLUE SHIELD | Admitting: *Deleted

## 2017-02-26 DIAGNOSIS — R7989 Other specified abnormal findings of blood chemistry: Secondary | ICD-10-CM | POA: Diagnosis not present

## 2017-02-26 DIAGNOSIS — E291 Testicular hypofunction: Secondary | ICD-10-CM

## 2017-02-26 MED ORDER — TESTOSTERONE CYPIONATE 200 MG/ML IM SOLN: INTRAMUSCULAR | 1 refills | Status: DC

## 2017-02-26 MED ORDER — TESTOSTERONE CYPIONATE 200 MG/ML IM SOLN: 200.0000 mg | Freq: Once | INTRAMUSCULAR | Status: DC

## 2017-02-26 NOTE — Progress Notes (Signed)
Patient seen today for testosterone injection; injection administered to left ventrogluteal IM; patient tolerated well; no s/s of reactions.

## 2017-03-22 ENCOUNTER — Ambulatory Visit (INDEPENDENT_AMBULATORY_CARE_PROVIDER_SITE_OTHER): Payer: BLUE CROSS/BLUE SHIELD | Admitting: *Deleted

## 2017-03-22 DIAGNOSIS — R7989 Other specified abnormal findings of blood chemistry: Secondary | ICD-10-CM | POA: Diagnosis not present

## 2017-03-22 MED ORDER — TESTOSTERONE CYPIONATE 200 MG/ML IM SOLN
200.0000 mg | Freq: Once | INTRAMUSCULAR | Status: AC
  Administered 2017-03-22: 200 mg via INTRAMUSCULAR

## 2017-03-22 NOTE — Progress Notes (Signed)
Patient here for testosterone injection; administered IM to right ventrogluteal; patient tolerated well; no s/s of reactions.

## 2017-04-09 ENCOUNTER — Ambulatory Visit (INDEPENDENT_AMBULATORY_CARE_PROVIDER_SITE_OTHER): Payer: BLUE CROSS/BLUE SHIELD

## 2017-04-09 DIAGNOSIS — R7989 Other specified abnormal findings of blood chemistry: Secondary | ICD-10-CM | POA: Diagnosis not present

## 2017-04-09 MED ORDER — TESTOSTERONE CYPIONATE 200 MG/ML IM SOLN
200.0000 mg | INTRAMUSCULAR | Status: DC
  Administered 2017-04-09 – 2017-06-02 (×2): 200 mg via INTRAMUSCULAR

## 2017-04-11 ENCOUNTER — Other Ambulatory Visit: Payer: Self-pay | Admitting: Family Medicine

## 2017-04-27 ENCOUNTER — Ambulatory Visit (INDEPENDENT_AMBULATORY_CARE_PROVIDER_SITE_OTHER): Payer: BLUE CROSS/BLUE SHIELD | Admitting: Family Medicine

## 2017-04-27 DIAGNOSIS — E349 Endocrine disorder, unspecified: Secondary | ICD-10-CM

## 2017-04-27 MED ORDER — TESTOSTERONE CYPIONATE 200 MG/ML IM SOLN
200.0000 mg | Freq: Once | INTRAMUSCULAR | Status: AC
  Administered 2017-04-27: 200 mg via INTRAMUSCULAR

## 2017-04-28 ENCOUNTER — Telehealth: Payer: Self-pay | Admitting: Family Medicine

## 2017-04-28 NOTE — Telephone Encounter (Signed)
noted 

## 2017-04-28 NOTE — Telephone Encounter (Signed)
FYI  Advised pt he should schedule a cpe since he has not been seen in a while and needs testosterone as well.  Pt did not understand why, and states this is first time anyone has ever called him to make an appointment. Pt states he is very busy.  Will have to check his schedule and call me back later, possibly tomorrow

## 2017-05-04 ENCOUNTER — Ambulatory Visit: Payer: BLUE CROSS/BLUE SHIELD

## 2017-05-07 ENCOUNTER — Telehealth: Payer: Self-pay | Admitting: *Deleted

## 2017-05-07 NOTE — Telephone Encounter (Signed)
Patient is aware 

## 2017-05-07 NOTE — Telephone Encounter (Signed)
Tapering off Methadone is very risky and takes someone who has expertise with that.  Pain management would be optimal but may be difficult because of lack of coverage. I would check first with methadone clinic to see if they would be willing to work with him on tapering

## 2017-05-07 NOTE — Telephone Encounter (Signed)
Left message on machine for patient to return our call 

## 2017-05-07 NOTE — Telephone Encounter (Signed)
Patient is calling because his son is 68 years old and is going to a methadone clinic.  He has been going for a year and a half.  His son does not have any insurance.  Mr Landgrebe has spoken to a pharmacist who has told him that his son should be off of methadone ,because he has been taking it for too long, and on different medication.  Mr Kowalski is looking for guidance as to who he can call for his son to receive the medication he might need.  He would like to know if you know where he can take his son?

## 2017-05-12 ENCOUNTER — Other Ambulatory Visit: Payer: BLUE CROSS/BLUE SHIELD

## 2017-05-14 NOTE — Telephone Encounter (Signed)
Spoke with patient and appointment has been rescheduled

## 2017-05-14 NOTE — Telephone Encounter (Signed)
Pt is not able to come in for CPE and would like to come in on 6/19 and was advised that Dr. Elease Hashimoto will not be in the office so he insist on having it done on Thursday 6/19 he would like to speak with the nurse.  Very nasty.

## 2017-05-18 ENCOUNTER — Ambulatory Visit (INDEPENDENT_AMBULATORY_CARE_PROVIDER_SITE_OTHER): Payer: BLUE CROSS/BLUE SHIELD | Admitting: Family Medicine

## 2017-05-18 ENCOUNTER — Encounter: Payer: Self-pay | Admitting: Family Medicine

## 2017-05-18 VITALS — BP 128/78 | HR 65 | Temp 98.3°F | Ht 69.0 in | Wt 198.0 lb

## 2017-05-18 DIAGNOSIS — Z0000 Encounter for general adult medical examination without abnormal findings: Secondary | ICD-10-CM

## 2017-05-18 DIAGNOSIS — E291 Testicular hypofunction: Secondary | ICD-10-CM

## 2017-05-18 DIAGNOSIS — Z125 Encounter for screening for malignant neoplasm of prostate: Secondary | ICD-10-CM

## 2017-05-18 LAB — BASIC METABOLIC PANEL
BUN: 21 mg/dL (ref 6–23)
CO2: 29 mEq/L (ref 19–32)
Calcium: 9.5 mg/dL (ref 8.4–10.5)
Chloride: 104 mEq/L (ref 96–112)
Creatinine, Ser: 1.2 mg/dL (ref 0.40–1.50)
GFR: 64.06 mL/min (ref >60.00)
Glucose, Bld: 94 mg/dL (ref 70–99)
Potassium: 4.2 mEq/L (ref 3.5–5.1)
Sodium: 141 mEq/L (ref 135–145)

## 2017-05-18 LAB — CBC WITH DIFFERENTIAL/PLATELET
Basophils Absolute: 0.1 10*3/uL (ref 0.0–0.1)
Basophils Relative: 0.7 % (ref 0.0–3.0)
Eosinophils Absolute: 0.2 10*3/uL (ref 0.0–0.7)
Eosinophils Relative: 2.1 % (ref 0.0–5.0)
HCT: 50.2 % (ref 39.0–52.0)
Hemoglobin: 17 g/dL (ref 13.0–17.0)
Lymphocytes Relative: 29.2 % (ref 12.0–46.0)
Lymphs Abs: 2.5 10*3/uL (ref 0.7–4.0)
MCHC: 33.9 g/dL (ref 30.0–36.0)
MCV: 98.5 fl (ref 78.0–100.0)
Monocytes Absolute: 0.5 10*3/uL (ref 0.1–1.0)
Monocytes Relative: 6 % (ref 3.0–12.0)
Neutro Abs: 5.2 10*3/uL (ref 1.4–7.7)
Neutrophils Relative %: 62 % (ref 43.0–77.0)
Platelets: 236 10*3/uL (ref 150.0–400.0)
RBC: 5.09 Mil/uL (ref 4.22–5.81)
RDW: 14.1 % (ref 11.5–15.5)
WBC: 8.4 10*3/uL (ref 4.0–10.5)

## 2017-05-18 LAB — LIPID PANEL
Cholesterol: 226 mg/dL — ABNORMAL HIGH (ref 0–200)
HDL: 44.5 mg/dL (ref >39.00)
LDL Cholesterol: 155 mg/dL — ABNORMAL HIGH (ref 0–99)
NonHDL: 181.92
Total CHOL/HDL Ratio: 5
Triglycerides: 134 mg/dL (ref 0.0–149.0)
VLDL: 26.8 mg/dL (ref 0.0–40.0)

## 2017-05-18 LAB — HEPATIC FUNCTION PANEL
ALT: 22 U/L (ref 0–53)
AST: 15 U/L (ref 0–37)
Albumin: 4.4 g/dL (ref 3.5–5.2)
Alkaline Phosphatase: 65 U/L (ref 39–117)
Bilirubin, Direct: 0.2 mg/dL (ref 0.0–0.3)
Total Bilirubin: 0.8 mg/dL (ref 0.2–1.2)
Total Protein: 6.7 g/dL (ref 6.0–8.3)

## 2017-05-18 LAB — TSH: TSH: 1.29 u[IU]/mL (ref 0.35–4.50)

## 2017-05-18 LAB — TESTOSTERONE: Testosterone: 177.85 ng/dL — ABNORMAL LOW (ref 300.00–890.00)

## 2017-05-18 LAB — PSA: PSA: 2.33 ng/mL (ref 0.10–4.00)

## 2017-05-18 MED ORDER — TESTOSTERONE CYPIONATE 200 MG/ML IM SOLN: INTRAMUSCULAR | 2 refills | Status: DC

## 2017-05-18 NOTE — Progress Notes (Signed)
Subjective:     Patient ID: Jon Spencer, male   DOB: 10/23/1951, 66 y.o.   MRN: [id number]  HPI   Patient seen for physical exam. His chronic problems include history of hypertension, low testosterone, dyslipidemia. Medications reviewed and compliant with all. He remains on amlodipine and losartan for hypertension. Blood pressures have been stable by home readings. No consistent exercise. Has been very involved with taking care of his 69 year old mother during the past year. He declines pneumonia vaccinations and also any further colon cancer screening. We discussed both colonoscopy and Cologuard and he declines both  Past Medical History:  Diagnosis Date  . ADD 01/17/2010  . HYPERLIPIDEMIA 01/18/2009  . HYPERTENSION 01/18/2009  . Impotence of organic origin 10/01/2010  . OSTEOARTHRITIS, HAND 04/17/2010  . TINNITUS 06/26/2009  . TRIGGER FINGER 01/18/2009   Past Surgical History:  Procedure Laterality Date  . APPENDECTOMY  1969  . VASECTOMY  1992    reports that he has never smoked. He does not have any smokeless tobacco history on file. His alcohol and drug histories are not on file. family history includes Arthritis in his other; Hyperlipidemia in his other; Hypertension in his other; Sudden death in his other. Allergies  Allergen Reactions  . Meloxicam     REACTION: Flushed, high temp  . Tolectin [Tolmetin Sodium]     Fever 104, breathing problems  . Univasc [Moexipril Hydrochloride]     coughing     Review of Systems  Constitutional: Negative for activity change, appetite change, fatigue and fever.  HENT: Negative for congestion, ear pain and trouble swallowing.   Eyes: Negative for pain and visual disturbance.  Respiratory: Negative for cough, shortness of breath and wheezing.   Cardiovascular: Negative for chest pain and palpitations.  Gastrointestinal: Negative for abdominal distention, abdominal pain, blood in stool, constipation, diarrhea, nausea, rectal pain and  vomiting.  Genitourinary: Negative for dysuria, hematuria and testicular pain.  Musculoskeletal: Negative for arthralgias and joint swelling.  Skin: Negative for rash.  Neurological: Negative for dizziness, syncope and headaches.  Hematological: Negative for adenopathy.  Psychiatric/Behavioral: Negative for confusion and dysphoric mood.       Objective:   Physical Exam  Constitutional: He is oriented to person, place, and time. He appears well-developed and well-nourished. No distress.  HENT:  Head: Normocephalic and atraumatic.  Right Ear: External ear normal.  Left Ear: External ear normal.  Mouth/Throat: Oropharynx is clear and moist.  Eyes: Pupils are equal, round, and reactive to light. Conjunctivae and EOM are normal.  Neck: Normal range of motion. Neck supple. No thyromegaly present.  Cardiovascular: Normal rate, regular rhythm and normal heart sounds.   No murmur heard. Pulmonary/Chest: No respiratory distress. He has no wheezes. He has no rales.  Abdominal: Soft. Bowel sounds are normal. He exhibits no distension and no mass. There is no tenderness. There is no rebound and no guarding.  Musculoskeletal: He exhibits no edema.  Lymphadenopathy:    He has no cervical adenopathy.  Neurological: He is alert and oriented to person, place, and time. He displays normal reflexes. No cranial nerve deficit.  Skin:  Thickened hyperkeratotic area dorsum right hand. This is been evaluated by dermatology in the past  Psychiatric: He has a normal mood and affect.       Assessment:     Physical exam.  Pt declines several health maintenance items- Prevnar 13, colonoscopy, Hep C screening.    Plan:     -Obtain screening lab work. He needs PSA,  testosterone level, and CBC because of ongoing testosterone therapy -We discussed further colon cancer screening and he declines -Is encouraged to follow-up with dermatology regarding his right hand lesion  Eulas Post MD [place]  [facility] at [facility]

## 2017-05-19 ENCOUNTER — Encounter: Payer: BLUE CROSS/BLUE SHIELD | Admitting: Family Medicine

## 2017-05-24 ENCOUNTER — Telehealth: Payer: Self-pay | Admitting: Family Medicine

## 2017-05-24 DIAGNOSIS — E291 Testicular hypofunction: Secondary | ICD-10-CM

## 2017-05-24 NOTE — Telephone Encounter (Signed)
Pt needs testosterone to go to [facility] not [facility] in [place]

## 2017-05-25 MED ORDER — TESTOSTERONE CYPIONATE 200 MG/ML IM SOLN: INTRAMUSCULAR | 2 refills | Status: DC

## 2017-05-25 NOTE — Telephone Encounter (Signed)
Rx called in 

## 2017-05-28 ENCOUNTER — Telehealth: Payer: Self-pay | Admitting: Family Medicine

## 2017-05-28 NOTE — Telephone Encounter (Signed)
° ° ° ° ° °  Pt call [facility] and they do not have the below med   testosterone cypionate (DEPOTESTOSTERONE CYPIONATE) 200 MG/ML injection    [facility]

## 2017-05-28 NOTE — Telephone Encounter (Signed)
Spoke with pharmacist and prescription has been called in.  It is too early to fill and so the prescription has not been filled.  Patient is aware.

## 2017-06-02 ENCOUNTER — Ambulatory Visit (INDEPENDENT_AMBULATORY_CARE_PROVIDER_SITE_OTHER): Payer: BLUE CROSS/BLUE SHIELD

## 2017-06-02 DIAGNOSIS — R7989 Other specified abnormal findings of blood chemistry: Secondary | ICD-10-CM

## 2017-06-02 NOTE — Progress Notes (Signed)
After obtaining consent, and per orders of Dr. Elease Hashimoto, injection of Testosteron 200mg  given by Melburn Hake Cox. Patient instructed to remain in clinic for 20 minutes afterwards, and to report any adverse reaction to me immediately.

## 2017-06-20 ENCOUNTER — Other Ambulatory Visit: Payer: Self-pay | Admitting: Family Medicine

## 2017-06-25 ENCOUNTER — Ambulatory Visit (INDEPENDENT_AMBULATORY_CARE_PROVIDER_SITE_OTHER): Payer: BLUE CROSS/BLUE SHIELD | Admitting: *Deleted

## 2017-06-25 DIAGNOSIS — R7989 Other specified abnormal findings of blood chemistry: Secondary | ICD-10-CM

## 2017-06-25 MED ORDER — TESTOSTERONE CYPIONATE 200 MG/ML IM SOLN
200.0000 mg | Freq: Once | INTRAMUSCULAR | Status: AC
  Administered 2017-06-25: 200 mg via INTRAMUSCULAR

## 2017-06-25 NOTE — Progress Notes (Signed)
Pt here for testosterone injection. Patient tolerated injection well. He c/o R lower back pain x2 months. Encouraged him to schedule appt w/ PCP for evaluation. He plans to check his calendar and call to schedule.   Dorrene German, RN

## 2017-07-22 ENCOUNTER — Encounter: Payer: Self-pay | Admitting: Family Medicine

## 2017-07-23 ENCOUNTER — Ambulatory Visit (INDEPENDENT_AMBULATORY_CARE_PROVIDER_SITE_OTHER): Payer: BLUE CROSS/BLUE SHIELD

## 2017-07-23 DIAGNOSIS — R7989 Other specified abnormal findings of blood chemistry: Secondary | ICD-10-CM | POA: Diagnosis not present

## 2017-07-23 MED ORDER — TESTOSTERONE CYPIONATE 200 MG/ML IM SOLN
200.0000 mg | INTRAMUSCULAR | Status: DC
  Administered 2017-07-23: 200 mg via INTRAMUSCULAR

## 2017-08-20 ENCOUNTER — Ambulatory Visit (INDEPENDENT_AMBULATORY_CARE_PROVIDER_SITE_OTHER): Payer: BLUE CROSS/BLUE SHIELD

## 2017-08-20 DIAGNOSIS — R7989 Other specified abnormal findings of blood chemistry: Secondary | ICD-10-CM | POA: Diagnosis not present

## 2017-08-20 MED ORDER — TESTOSTERONE CYPIONATE 200 MG/ML IM SOLN
200.0000 mg | Freq: Once | INTRAMUSCULAR | Status: AC
  Administered 2017-08-20: 200 mg via INTRAMUSCULAR

## 2017-09-17 ENCOUNTER — Ambulatory Visit (INDEPENDENT_AMBULATORY_CARE_PROVIDER_SITE_OTHER): Payer: BLUE CROSS/BLUE SHIELD

## 2017-09-17 DIAGNOSIS — E291 Testicular hypofunction: Secondary | ICD-10-CM | POA: Diagnosis not present

## 2017-09-17 MED ORDER — TESTOSTERONE CYPIONATE 200 MG/ML IM SOLN
200.0000 mg | INTRAMUSCULAR | Status: DC
Start: 2017-09-17 — End: 2017-12-31
  Administered 2017-09-17: 200 mg via INTRAMUSCULAR

## 2017-09-17 NOTE — Progress Notes (Signed)
Agree with injection, as given.  Eulas Post MD [place] [facility]

## 2017-09-24 ENCOUNTER — Other Ambulatory Visit: Payer: Self-pay | Admitting: Family Medicine

## 2017-10-08 ENCOUNTER — Ambulatory Visit (INDEPENDENT_AMBULATORY_CARE_PROVIDER_SITE_OTHER): Payer: BLUE CROSS/BLUE SHIELD

## 2017-10-08 DIAGNOSIS — E291 Testicular hypofunction: Secondary | ICD-10-CM | POA: Diagnosis not present

## 2017-10-08 MED ORDER — TESTOSTERONE CYPIONATE 200 MG/ML IM SOLN
200.0000 mg | INTRAMUSCULAR | Status: DC
  Administered 2017-10-08: 200 mg via INTRAMUSCULAR

## 2017-10-10 ENCOUNTER — Other Ambulatory Visit: Payer: Self-pay | Admitting: Family Medicine

## 2017-10-13 NOTE — Progress Notes (Signed)
Agree with injection as given.    Eulas Post MD [place] [facility] at [facility]

## 2017-10-21 ENCOUNTER — Other Ambulatory Visit: Payer: Self-pay

## 2017-10-21 ENCOUNTER — Ambulatory Visit (INDEPENDENT_AMBULATORY_CARE_PROVIDER_SITE_OTHER): Payer: BLUE CROSS/BLUE SHIELD

## 2017-10-21 DIAGNOSIS — E291 Testicular hypofunction: Secondary | ICD-10-CM

## 2017-10-21 MED ORDER — TESTOSTERONE CYPIONATE 200 MG/ML IM SOLN
200.0000 mg | INTRAMUSCULAR | Status: DC
  Administered 2017-10-21: 200 mg via INTRAMUSCULAR

## 2017-10-21 MED ORDER — TESTOSTERONE CYPIONATE 200 MG/ML IM SOLN: INTRAMUSCULAR | 2 refills | Status: DC

## 2017-12-21 ENCOUNTER — Ambulatory Visit (INDEPENDENT_AMBULATORY_CARE_PROVIDER_SITE_OTHER): Payer: BLUE CROSS/BLUE SHIELD | Admitting: Family Medicine

## 2017-12-21 ENCOUNTER — Encounter: Payer: Self-pay | Admitting: Family Medicine

## 2017-12-21 VITALS — BP 130/70 | HR 60 | Temp 98.3°F | Wt 197.3 lb

## 2017-12-21 DIAGNOSIS — N529 Male erectile dysfunction, unspecified: Secondary | ICD-10-CM

## 2017-12-21 DIAGNOSIS — M79644 Pain in right finger(s): Secondary | ICD-10-CM | POA: Diagnosis not present

## 2017-12-21 DIAGNOSIS — M7918 Myalgia, other site: Secondary | ICD-10-CM | POA: Diagnosis not present

## 2017-12-21 DIAGNOSIS — E291 Testicular hypofunction: Secondary | ICD-10-CM

## 2017-12-21 MED ORDER — TESTOSTERONE CYPIONATE 200 MG/ML IM SOLN: INTRAMUSCULAR | 2 refills | Status: DC

## 2017-12-21 MED ORDER — SILDENAFIL CITRATE 20 MG PO TABS: ORAL_TABLET | ORAL | 1 refills | Status: DC

## 2017-12-21 NOTE — Patient Instructions (Signed)
Piriformis Syndrome Rehab Ask your health care provider which exercises are safe for you. Do exercises exactly as told by your health care provider and adjust them as directed. It is normal to feel mild stretching, pulling, tightness, or discomfort as you do these exercises, but you should stop right away if you feel sudden pain or your pain gets worse.Do not begin these exercises until told by your health care provider. Stretching and range of motion exercises These exercises warm up your muscles and joints and improve the movement and flexibility of your hip and pelvis. These exercises also help to relieve pain, numbness, and tingling. Exercise A: Hip rotators  1. Lie on your back on a firm surface. 2. Pull your left / right knee toward your same shoulder with your left / right hand until your knee is pointing toward the ceiling. Hold your left / right ankle with your other hand. 3. Keeping your knee steady, gently pull your left / right ankle toward your other shoulder until you feel a stretch in your buttocks. 4. Hold this position for __________ seconds. Repeat __________ times. Complete this stretch __________ times a day. Exercise B: Hip extensors 1. Lie on your back on a firm surface. Both of your legs should be straight. 2. Pull your left / right knee to your chest. Hold your leg in this position by holding onto the back of your thigh or the front of your knee. 3. Hold this position for __________ seconds. 4. Slowly return to the starting position. Repeat __________ times. Complete this stretch __________ times a day. Strengthening exercises These exercises build strength and endurance in your hip and thigh muscles. Endurance is the ability to use your muscles for a long time, even after they get tired. Exercise C: Straight leg raises ( hip abductors) 1. Lie on your side with your left / right leg in the top position. Lie so your head, shoulder, knee, and hip line up. Bend your bottom  knee to help you balance. 2. Lift your top leg up 4-6 inches (10-15 cm), keeping your toes pointed straight ahead. 3. Hold this position for __________ seconds. 4. Slowly lower your leg to the starting position. Let your muscles relax completely. Repeat __________ times. Complete this exercise__________ times a day. Exercise D: Hip abductors and rotators, quadruped  1. Get on your hands and knees on a firm, lightly padded surface. Your hands should be directly below your shoulders, and your knees should be directly below your hips. 2. Lift your left / right knee out to the side. Keep your knee bent. Do not twist your body. 3. Hold this position for __________ seconds. 4. Slowly lower your leg. Repeat __________ times. Complete this exercise__________ times a day. Exercise E: Straight leg raises ( hip extensors) 1. Lie on your abdomen on a bed or a firm surface with a pillow under your hips. 2. Squeeze your buttock muscles and lift your left / right thigh off the bed. Do not let your back arch. 3. Hold this position for __________ seconds. 4. Slowly return to the starting position. Let your muscles relax completely before doing another repetition. Repeat __________ times. Complete this exercise__________ times a day. This information is not intended to replace advice given to you by your health care provider. Make sure you discuss any questions you have with your health care provider. Document Released: 10/19/2005 Document Revised: 06/23/2016 Document Reviewed: 10/01/2015 Elsevier Interactive Patient Education  [employer].  Let me know in one  week if right ring finger pain no better.

## 2017-12-21 NOTE — Progress Notes (Signed)
Subjective:     Patient ID: Jon Spencer, male   DOB: [birth year], 67 y.o.   MRN: [id number]  HPI Patient seen for the following issues  Right buttock pain since just before Thanksgiving. He is on testosterone replacement and wondered if his pain may be related to previous injection though he does not recall any specific onset of pain following injection. His pain is very localized right buttock without radiation. This is a location similar to where his wallet would be. He wears his wallet in the left side. Denies any lower extremity numbness or weakness. No urine or stool incontinence. Pain is moderate at times. Sometimes noted with prolonged periods of sitting  He relates remote history of coccyx fracture 1974 while in the [employer]. This pain is different. Taken some Aleve with mild relief  Second issue is acute right ring finger pain which he describes a sharp pain which radiates from the wrist out to the finger. No other finger involvement. Denies trigger finger. No known injury. Onset on Saturday. No color changes. No numbness. No weakness.  History of erectile dysfunction. Requesting refills of sildenafil  Past Medical History:  Diagnosis Date  . ADD 01/17/2010  . HYPERLIPIDEMIA 01/18/2009  . HYPERTENSION 01/18/2009  . Impotence of organic origin 10/01/2010  . OSTEOARTHRITIS, HAND 04/17/2010  . TINNITUS 06/26/2009  . TRIGGER FINGER 01/18/2009   Past Surgical History:  Procedure Laterality Date  . APPENDECTOMY  1969  . VASECTOMY  1992    reports that  has never smoked. he has never used smokeless tobacco. His alcohol and drug histories are not on file. family history includes Arthritis in his other; Hyperlipidemia in his other; Hypertension in his other; Sudden death in his other. Allergies  Allergen Reactions  . Meloxicam     REACTION: Flushed, high temp  . Tolectin [Tolmetin Sodium]     Fever 104, breathing problems  . Univasc [Moexipril Hydrochloride]     coughing      Review of Systems  Constitutional: Negative for appetite change, chills, fever and unexpected weight change.  Respiratory: Negative for shortness of breath.   Cardiovascular: Negative for chest pain.  Gastrointestinal: Negative for abdominal pain.  Genitourinary: Negative for dysuria.  Musculoskeletal: Positive for back pain.  Skin: Negative for rash.  Neurological: Negative for weakness and numbness.       Objective:   Physical Exam  Constitutional: He is oriented to person, place, and time. He appears well-developed and well-nourished.  HENT:  Right Ear: External ear normal.  Left Ear: External ear normal.  Mouth/Throat: Oropharynx is clear and moist.  Eyes: Pupils are equal, round, and reactive to light.  Neck: Neck supple. No thyromegaly present.  Cardiovascular: Normal rate and regular rhythm.  Pulmonary/Chest: Effort normal and breath sounds normal. No respiratory distress. He has no wheezes. He has no rales.  Musculoskeletal: He exhibits no edema.  Straight leg raise negative bilaterally. He has no edema lower extremity. Good distal pulses throughout  Neurological: He is alert and oriented to person, place, and time.  Full strength upper extremities and lower extremities. Symmetric reflexes throughout. Normal sensory function to touch       Assessment:     #1 right buttock pain. Question piriformis syndrome. Nonfocal exam neurologically  #2 acute right ring finger pain. No evidence for trigger finger. Strength is intact. No sensory impairment. Sounds more neuropathic.  #3 erectile dysfunction    Plan:     -Refill sildenafil for as needed use -Handout given on  exercises and stretches for piriformis syndrome and touch base in a few weeks if not improving -Consider nighttime wrist splint for right hand and wrist and touch base in a couple weeks if not improving  Eulas Post MD [place] [facility] at [facility]

## 2017-12-24 ENCOUNTER — Ambulatory Visit: Payer: BLUE CROSS/BLUE SHIELD

## 2017-12-27 ENCOUNTER — Ambulatory Visit: Payer: BLUE CROSS/BLUE SHIELD

## 2017-12-31 ENCOUNTER — Ambulatory Visit (INDEPENDENT_AMBULATORY_CARE_PROVIDER_SITE_OTHER): Payer: BLUE CROSS/BLUE SHIELD | Admitting: *Deleted

## 2017-12-31 DIAGNOSIS — E291 Testicular hypofunction: Secondary | ICD-10-CM | POA: Diagnosis not present

## 2017-12-31 MED ORDER — TESTOSTERONE CYPIONATE 200 MG/ML IM SOLN
200.0000 mg | Freq: Once | INTRAMUSCULAR | Status: AC
  Administered 2017-12-31: 200 mg via INTRAMUSCULAR

## 2017-12-31 NOTE — Progress Notes (Signed)
Per orders of Dr. Burchette, injection of testosterone 200 mg given by Myla Mauriello J Gino Garrabrant. Patient tolerated injection well. 

## 2018-01-11 ENCOUNTER — Telehealth: Payer: Self-pay | Admitting: Family Medicine

## 2018-01-11 ENCOUNTER — Encounter: Payer: Self-pay | Admitting: Family Medicine

## 2018-01-11 MED ORDER — GABAPENTIN 300 MG PO CAPS: 300.0000 mg | ORAL_CAPSULE | Freq: Two times a day (BID) | ORAL | 1 refills | Status: DC

## 2018-01-11 NOTE — Telephone Encounter (Signed)
Left message on machine for patient to return our call.  CRM created 

## 2018-01-11 NOTE — Telephone Encounter (Signed)
Copied from [place] [id number]. Topic: Quick Communication - See Telephone Encounter >> Jan 11, 2018  8:25 AM Lolita Rieger, RMA wrote: CRM for notification. See Telephone encounter for:   01/11/18.pt called and stated that during his visit last week that he was told that a nerve medication could be prescribed if his pain got any worse and pt would like something to be sent in because his pain has gotten so bad that he can not sleep in a bed  Pt pharmacy [facility] pt # [phone]

## 2018-01-11 NOTE — Telephone Encounter (Signed)
Let's start Gabapentin 300 mg qhs.  After 3 days may increase to 300 mg bid if no relief with one at night.   Give some feedback in one week.  This medication may cause some drowsiness.  Gabapentin 300 mg #60 with 1 refill.

## 2018-01-11 NOTE — Telephone Encounter (Signed)
Rx sent 

## 2018-01-11 NOTE — Telephone Encounter (Signed)
Pt verbalizes understanding. Would like Gabapentin called in so he can pick up at 1630 if at all possible.  [id number] [place] [place] - [address] [place]

## 2018-01-11 NOTE — Telephone Encounter (Signed)
Copied from [place] [id number]. Topic: Quick Communication - See Telephone Encounter >> Jan 11, 2018  8:25 AM Lolita Rieger, RMA wrote: CRM for notification. See Telephone encounter for:   01/11/18.pt called and stated that during his visit last week that he was told that a nerve medication could be prescribed if his pain got any worse and pt would like something to be sent in because his pain has gotten so bad that he can not sleep in a bed  Pt pharmacy [facility] pt # [phone]

## 2018-01-11 NOTE — Telephone Encounter (Signed)
This encounter was created in error - please disregard.

## 2018-01-18 ENCOUNTER — Ambulatory Visit: Payer: BLUE CROSS/BLUE SHIELD

## 2018-01-18 ENCOUNTER — Telehealth: Payer: Self-pay | Admitting: Family Medicine

## 2018-01-18 NOTE — Telephone Encounter (Signed)
Noted  

## 2018-01-18 NOTE — Telephone Encounter (Signed)
Copied from [place]. Topic: Quick Communication - See Telephone Encounter >> Jan 18, 2018  8:27 AM Aurelio Brash B wrote: CRM for notification. See Telephone encounter for:   PT called to give Dr Elease Hashimoto an update on his back and hip -  pain knocked down  still has a  lot of tightness in  butt and top legs  and some in hip,  tight muscle feeling,  it gets  painful,  just not as severe as it was   01/18/18.

## 2018-01-19 ENCOUNTER — Ambulatory Visit (INDEPENDENT_AMBULATORY_CARE_PROVIDER_SITE_OTHER): Payer: BLUE CROSS/BLUE SHIELD | Admitting: Family Medicine

## 2018-01-19 DIAGNOSIS — E349 Endocrine disorder, unspecified: Secondary | ICD-10-CM | POA: Diagnosis not present

## 2018-01-19 MED ORDER — TESTOSTERONE CYPIONATE 200 MG/ML IM SOLN
200.0000 mg | Freq: Once | INTRAMUSCULAR | Status: AC
  Administered 2018-01-19: 200 mg via INTRAMUSCULAR

## 2018-01-19 NOTE — Progress Notes (Signed)
Per orders of Dr. Burchette, injection of Testosterone 200 mg given by Merline Perkin ANN. Patient tolerated injection well. 

## 2018-01-24 ENCOUNTER — Other Ambulatory Visit: Payer: Self-pay | Admitting: *Deleted

## 2018-01-24 MED ORDER — AMLODIPINE BESYLATE 10 MG PO TABS: 10.0000 mg | ORAL_TABLET | Freq: Every day | ORAL | 1 refills | Status: DC

## 2018-02-04 ENCOUNTER — Telehealth: Payer: Self-pay | Admitting: *Deleted

## 2018-02-04 MED ORDER — GABAPENTIN 300 MG PO CAPS: 300.0000 mg | ORAL_CAPSULE | Freq: Two times a day (BID) | ORAL | 0 refills | Status: DC

## 2018-02-04 NOTE — Telephone Encounter (Signed)
Ok to refill for 30 days  

## 2018-02-04 NOTE — Telephone Encounter (Signed)
[facility] Rx refill  gabapentin (NEURONTIN) 300 MG capsule  Last refill 01/11/18  Last office visit 12/21/17   Okay to fill?

## 2018-02-17 ENCOUNTER — Ambulatory Visit (INDEPENDENT_AMBULATORY_CARE_PROVIDER_SITE_OTHER): Payer: BLUE CROSS/BLUE SHIELD

## 2018-02-17 DIAGNOSIS — R7989 Other specified abnormal findings of blood chemistry: Secondary | ICD-10-CM | POA: Diagnosis not present

## 2018-02-17 MED ORDER — TESTOSTERONE CYPIONATE 200 MG/ML IM SOLN
200.0000 mg | INTRAMUSCULAR | Status: DC
  Administered 2018-02-17 – 2018-03-11 (×2): 200 mg via INTRAMUSCULAR

## 2018-02-17 NOTE — Progress Notes (Signed)
Per orders of Dr. Elease Hashimoto, injection of Testosterone 200 mg/mL given by Wyvonne Lenz. Patient tolerated injection well. Pt stated has some pain on the right Upper Quad, injection given on the Left Upper Quad.

## 2018-03-11 ENCOUNTER — Ambulatory Visit (INDEPENDENT_AMBULATORY_CARE_PROVIDER_SITE_OTHER): Payer: BLUE CROSS/BLUE SHIELD | Admitting: Family Medicine

## 2018-03-11 DIAGNOSIS — R7989 Other specified abnormal findings of blood chemistry: Secondary | ICD-10-CM

## 2018-03-11 DIAGNOSIS — E349 Endocrine disorder, unspecified: Secondary | ICD-10-CM

## 2018-03-11 MED ORDER — TESTOSTERONE CYPIONATE 200 MG/ML IM SOLN: 200.0000 mg | Freq: Once | INTRAMUSCULAR | Status: DC

## 2018-03-11 NOTE — Progress Notes (Signed)
Per orders of Dr. Burchette, injection of Testosterone 200 mg given by Pearl Berlinger ANN. Patient tolerated injection well. 

## 2018-03-14 ENCOUNTER — Ambulatory Visit (INDEPENDENT_AMBULATORY_CARE_PROVIDER_SITE_OTHER): Payer: BLUE CROSS/BLUE SHIELD | Admitting: Family Medicine

## 2018-03-14 ENCOUNTER — Encounter: Payer: Self-pay | Admitting: Family Medicine

## 2018-03-14 VITALS — BP 160/80 | HR 78 | Temp 98.2°F | Wt 204.6 lb

## 2018-03-14 DIAGNOSIS — M545 Low back pain, unspecified: Secondary | ICD-10-CM

## 2018-03-14 NOTE — Progress Notes (Signed)
  Subjective:     Patient ID: Jon Spencer, male   DOB: Mar 09, 1951, 67 y.o.   MRN: [id number]  HPI Patient seen with some persistent low back pain. Refer to prior note. Previous pain was radiating into the right buttock but current pain is more midline lower sacral and coccyx area. He relates coccyx fracture back in the [employer]. His current pain is worse with prolonged periods of sitting such as driving. Heat helps. He also has pain was slightly worse with bending over. He's not had any pain with bowel movements. No fevers or chills. He's taken some gabapentin recently and states this is helping his sleep. He has been limited in daytime use because of daytime sedation.  Denies any lower extremity weakness or numbness. He had episode Friday night where he was having intercourse with wife when he had sharp pain in his sacrum and coccyx area. This excluded further activities that day  Past Medical History:  Diagnosis Date  . ADD 01/17/2010  . HYPERLIPIDEMIA 01/18/2009  . HYPERTENSION 01/18/2009  . Impotence of organic origin 10/01/2010  . OSTEOARTHRITIS, HAND 04/17/2010  . TINNITUS 06/26/2009  . TRIGGER FINGER 01/18/2009   Past Surgical History:  Procedure Laterality Date  . APPENDECTOMY  1969  . VASECTOMY  1992    reports that he has never smoked. He has never used smokeless tobacco. His alcohol and drug histories are not on file. family history includes Arthritis in his other; Hyperlipidemia in his other; Hypertension in his other; Sudden death in his other. Allergies  Allergen Reactions  . Meloxicam     REACTION: Flushed, high temp  . Tolectin [Tolmetin Sodium]     Fever 104, breathing problems  . Univasc [Moexipril Hydrochloride]     coughing     Review of Systems  Constitutional: Negative for appetite change, chills, fever and unexpected weight change.  Respiratory: Negative for shortness of breath.   Gastrointestinal: Negative for abdominal pain, blood in stool and  constipation.  Genitourinary: Negative for dysuria.  Musculoskeletal: Positive for back pain.  Skin: Negative for rash.       Objective:   Physical Exam  Constitutional: He appears well-developed and well-nourished.  Cardiovascular: Normal rate and regular rhythm.  Pulmonary/Chest: Effort normal and breath sounds normal.  Musculoskeletal: He exhibits no edema.  Lower lumbar and sacral area is examined. No evidence for any pilonidal inflammation or abscess       Assessment:     Patient resents with several month history of some intermittent possibly somewhat progressive sacral and coccygeal pain with reported remote history of coccyx fracture 1974    Plan:     -Given duration of symptoms we've recommended films lumbosacral spine and coccyx and patient agrees. This will be set up for outpatient imaging -consider donut cushion for driving. -continue Gabapentin for night use.  Eulas Post MD [place] [facility] at [facility]

## 2018-03-15 ENCOUNTER — Ambulatory Visit (INDEPENDENT_AMBULATORY_CARE_PROVIDER_SITE_OTHER)
Admission: RE | Admit: 2018-03-15 | Discharge: 2018-03-15 | Disposition: A | Discharge status: Home / Self Care | Payer: BLUE CROSS/BLUE SHIELD | Source: Ambulatory Visit | Attending: Family Medicine | Admitting: Family Medicine

## 2018-03-15 DIAGNOSIS — M545 Low back pain, unspecified: Secondary | ICD-10-CM

## 2018-03-15 DIAGNOSIS — M48061 Spinal stenosis, lumbar region without neurogenic claudication: Secondary | ICD-10-CM | POA: Diagnosis not present

## 2018-03-15 DIAGNOSIS — M533 Sacrococcygeal disorders, not elsewhere classified: Secondary | ICD-10-CM | POA: Diagnosis not present

## 2018-03-16 ENCOUNTER — Telehealth: Payer: Self-pay | Admitting: Family Medicine

## 2018-03-16 DIAGNOSIS — M545 Low back pain, unspecified: Secondary | ICD-10-CM

## 2018-03-16 MED ORDER — HYDROCODONE-ACETAMINOPHEN 5-325 MG PO TABS: 1.0000 | ORAL_TABLET | Freq: Four times a day (QID) | ORAL | 0 refills | Status: DC | PRN

## 2018-03-16 NOTE — Telephone Encounter (Signed)
Patient is aware.  Patient has agreed to referral to sports med.  Referral placed.  Patient would like a prescription for pain.  Patient states that gabapentin "is not touching the pain".   [facility]

## 2018-03-16 NOTE — Telephone Encounter (Signed)
I sent in rx for some Vicodin to take as needed for severe pain.

## 2018-03-16 NOTE — Telephone Encounter (Signed)
Copied from [place] [id number]. Topic: Quick Communication - Other Results >> Mar 16, 2018  7:40 AM Scherrie Gerlach wrote: Pt calling back for results of xray yesterday. Pt wants Dr Elease Hashimoto to know the pain in his buttocks last night when he went to bed was severe and debilitating.

## 2018-03-16 NOTE — Telephone Encounter (Signed)
Sent in rx by computer for some Vicodin

## 2018-03-16 NOTE — Telephone Encounter (Signed)
Pt has fairly prominent scoliosis changes of lumbar spine.  No acute changes involving the coccyx.  Make sure he is still taking the Gabapentin at night.  I would go ahead and put in referral to sports med (if he is willing) to get their input.   Let me know if we need to send in additional pain medication for short term use only.

## 2018-03-21 ENCOUNTER — Other Ambulatory Visit: Payer: Self-pay | Admitting: Family Medicine

## 2018-03-22 ENCOUNTER — Encounter (INDEPENDENT_AMBULATORY_CARE_PROVIDER_SITE_OTHER): Payer: Self-pay | Admitting: Orthopedic Surgery

## 2018-03-22 ENCOUNTER — Ambulatory Visit (INDEPENDENT_AMBULATORY_CARE_PROVIDER_SITE_OTHER): Payer: BLUE CROSS/BLUE SHIELD | Admitting: Orthopedic Surgery

## 2018-03-22 VITALS — Ht 69.0 in | Wt 204.0 lb

## 2018-03-22 DIAGNOSIS — M5416 Radiculopathy, lumbar region: Secondary | ICD-10-CM

## 2018-03-22 MED ORDER — PREDNISONE 10 MG PO TABS: 20.0000 mg | ORAL_TABLET | Freq: Every day | ORAL | 0 refills | Status: DC

## 2018-03-22 NOTE — Progress Notes (Signed)
Office Visit Note   Patient: Jon Spencer           Date of Birth: 06/06/1951           MRN: [id number] Visit Date: 03/22/2018              Requested by: Eulas Post, MD [place], [place] [address] PCP: Eulas Post, MD  Chief Complaint  Patient presents with  . Lower Back - Pain      HPI: Patient is a 67 year old gentleman who has had lower back pain for several months that radiates into the right buttock.  The pain is worse when he tries to lay down at night and he sleeps in a reclining chair.  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  Review of his radiographs on 03/15/2018 shows some mild scoliosis with degenerative disc disease and facet arthritis.  Review of systems positive for hypertension.  Assessment & Plan: Visit Diagnoses:  1. Lumbar radicular pain     Plan: We will start him on prednisone 20 mg with breakfast he will use the Vicodin for breakthrough pain.  We will set him up for an MRI scan to see if a epidural injection would be beneficial.  Follow-Up Instructions: Return in about 3 weeks (around 04/12/2018).   Ortho Exam  Patient is alert, oriented, no adenopathy, well-dressed, normal affect, normal respiratory effort. Examination patient has a normal gait.  He has a negative straight leg raise bilaterally no pain with range of motion of the hip knee or ankle.  He has good motor strength in the right lower extremity no focal defect.  Imaging: No results found. No images are attached to the encounter.  Labs: No results found for: HGBA1C, ESRSEDRATE, CRP, LABURIC, REPTSTATUS, GRAMSTAIN, CULT, LABORGA   Lab Results  Component Value Date   ALBUMIN 4.4 05/18/2017   ALBUMIN 3.9 09/26/2013    Body mass index is 30.13 kg/m.  Orders:  No orders of the defined types were placed in this encounter.  No orders of the defined types were placed in this  encounter.    Procedures: No procedures performed  Clinical Data: No additional findings.  ROS:  All other systems negative, except as noted in the HPI. Review of Systems  Objective: Vital Signs: Ht 5\' 9"  (1.753 m)   Wt 204 lb (92.5 kg)   BMI 30.13 kg/m   Specialty Comments:  No specialty comments available.  PMFS History: Patient Active Problem List   Diagnosis Date Noted  . Polycythemia 05/06/2016  . Hypogonadism male 04/10/2011  . IMPOTENCE OF ORGANIC ORIGIN 10/01/2010  . OSTEOARTHRITIS, HAND 04/17/2010  . ADD 01/17/2010  . TINNITUS 06/26/2009  . HYPERLIPIDEMIA 01/18/2009  . Essential hypertension 01/18/2009  . TRIGGER FINGER 01/18/2009   Past Medical History:  Diagnosis Date  . ADD 01/17/2010  . HYPERLIPIDEMIA 01/18/2009  . HYPERTENSION 01/18/2009  . Impotence of organic origin 10/01/2010  . OSTEOARTHRITIS, HAND 04/17/2010  . TINNITUS 06/26/2009  . TRIGGER FINGER 01/18/2009    Family History  Problem Relation Age of Onset  . Arthritis Other   . Hyperlipidemia Other   . Hypertension Other   . Sudden death Other     Past Surgical History:  Procedure Laterality Date  . APPENDECTOMY  1969  . VASECTOMY  1992   Social History   Occupational History  . Not on file  Tobacco  Use  . Smoking status: Never Smoker  . Smokeless tobacco: Never Used  Substance and Sexual Activity  . Alcohol use: Not on file  . Drug use: Not on file  . Sexual activity: Not on file

## 2018-03-22 NOTE — Addendum Note (Signed)
Addended by: Meridee Score on: 03/22/2018 12:01 PM   Modules accepted: Orders

## 2018-04-07 ENCOUNTER — Other Ambulatory Visit: Payer: BLUE CROSS/BLUE SHIELD

## 2018-04-08 ENCOUNTER — Telehealth (INDEPENDENT_AMBULATORY_CARE_PROVIDER_SITE_OTHER): Payer: Self-pay

## 2018-04-08 ENCOUNTER — Telehealth (INDEPENDENT_AMBULATORY_CARE_PROVIDER_SITE_OTHER): Payer: Self-pay | Admitting: *Deleted

## 2018-04-08 ENCOUNTER — Ambulatory Visit
Admission: RE | Admit: 2018-04-08 | Discharge: 2018-04-08 | Disposition: A | Discharge status: Home / Self Care | Payer: BLUE CROSS/BLUE SHIELD | Source: Ambulatory Visit | Attending: Orthopedic Surgery | Admitting: Orthopedic Surgery

## 2018-04-08 ENCOUNTER — Other Ambulatory Visit (INDEPENDENT_AMBULATORY_CARE_PROVIDER_SITE_OTHER): Payer: Self-pay

## 2018-04-08 DIAGNOSIS — G8929 Other chronic pain: Secondary | ICD-10-CM

## 2018-04-08 DIAGNOSIS — M545 Low back pain: Principal | ICD-10-CM

## 2018-04-08 DIAGNOSIS — M5416 Radiculopathy, lumbar region: Secondary | ICD-10-CM

## 2018-04-08 DIAGNOSIS — M48061 Spinal stenosis, lumbar region without neurogenic claudication: Secondary | ICD-10-CM | POA: Diagnosis not present

## 2018-04-08 NOTE — Telephone Encounter (Signed)
Order in chart per Dr. Sharol Given for urgent neurosurgical eval.

## 2018-04-08 NOTE — Telephone Encounter (Signed)
Received call from [facility] with [facility] advising me has received referral as urgent and Dr. Kathyrn Sheriff is the on call provider today and has scheduled pt for Monday June 10 at 345p but wanted to inform us that pt is irritated because when she called pt he did not know why he was being referred and no one called him about the MRI results, pt is wanting to know why is he being referred and Rollene Fare did not have medical credentials to tell him what was results and asked the pt to call our office to get from [place]. Pt will be calling office to get resultts.

## 2018-04-08 NOTE — Progress Notes (Signed)
I tried call [facility] and was on hold for awhile sent referral as URGENT to [facility] for urgent MD on call to review and call pt to schedule appt. Pending appt

## 2018-04-08 NOTE — Telephone Encounter (Signed)
Call report for pt's MRI L spine. information has been replayed to Dr. Sharol Given who is in the OR today will hold message pending his advisement.

## 2018-04-11 DIAGNOSIS — D497 Neoplasm of unspecified behavior of endocrine glands and other parts of nervous system: Secondary | ICD-10-CM | POA: Diagnosis not present

## 2018-04-11 DIAGNOSIS — Z6829 Body mass index (BMI) 29.0-29.9, adult: Secondary | ICD-10-CM | POA: Diagnosis not present

## 2018-04-11 DIAGNOSIS — I10 Essential (primary) hypertension: Secondary | ICD-10-CM | POA: Diagnosis not present

## 2018-04-11 NOTE — Telephone Encounter (Signed)
Dr. Sharol Given spoke with pt.

## 2018-04-12 ENCOUNTER — Other Ambulatory Visit: Payer: Self-pay | Admitting: Family Medicine

## 2018-04-14 ENCOUNTER — Telehealth: Payer: Self-pay | Admitting: Family Medicine

## 2018-04-14 ENCOUNTER — Ambulatory Visit (INDEPENDENT_AMBULATORY_CARE_PROVIDER_SITE_OTHER): Payer: Medicare Other | Admitting: Orthopedic Surgery

## 2018-04-14 DIAGNOSIS — M545 Low back pain, unspecified: Secondary | ICD-10-CM

## 2018-04-14 NOTE — Telephone Encounter (Signed)
Copied from [place] [id number]. Topic: Quick Communication - See Telephone Encounter >> Apr 14, 2018  1:45 PM Hewitt Shorts wrote: Pt is needing a refill on prednisone but is needing to know who will be the one to refill it either Dr. Elease Hashimoto or Dr. Patrina Levering number [phone]

## 2018-04-14 NOTE — Telephone Encounter (Signed)
Pt requesting refill of Prednisone which was originally prescribed by Dr. Sharol Given.Notified pt that he should contact Dr. Sharol Given for refill of medication. Pt states that he was referred to Dr. Sharol Given by Dr. Elease Hashimoto. Pt states he was then referred to a spine doctor by Dr. Sharol Given and is considering surgery at this time. Pt asking if Dr. Elease Hashimoto would fill the medication since he is his PCP. Pt states he has been taking 2 prednisone in the morning which helps with his back pain. Pt states she only has 4-5 days left of current prescription and wanted to see if he could get the medication filled before the weekend. [facility] [place]

## 2018-04-15 NOTE — Telephone Encounter (Signed)
OK 

## 2018-04-15 NOTE — Telephone Encounter (Signed)
Patient is aware that he should get a prescription of prednisone from Dr Sharol Given.  Patient does not want to go back to the ortho doctor seen for his back pain.  He would like a referral to Dr Ellene Route.  Okay to refer?

## 2018-04-15 NOTE — Telephone Encounter (Signed)
Agree - would get from Dr Sharol Given- since they prescribed,

## 2018-04-15 NOTE — Telephone Encounter (Signed)
Referral placed.

## 2018-04-18 ENCOUNTER — Other Ambulatory Visit (INDEPENDENT_AMBULATORY_CARE_PROVIDER_SITE_OTHER): Payer: Self-pay | Admitting: Orthopedic Surgery

## 2018-04-19 ENCOUNTER — Telehealth: Payer: Self-pay | Admitting: Family Medicine

## 2018-04-19 NOTE — Telephone Encounter (Signed)
Refill

## 2018-04-19 NOTE — Telephone Encounter (Signed)
Pt had requested the referral to Dr Ellene Route- so yes would go ahead with that.

## 2018-04-19 NOTE — Telephone Encounter (Signed)
Copied from [place] [id number]. Topic: Quick Communication - See Telephone Encounter >> Apr 19, 2018 12:39 PM Rutherford Nail, NT wrote: CRM for notification. See Telephone encounter for: 04/19/18. Leah with [facility] calling in regards to the referral that was placed on 04/15/18 with Dr Ellene Route for low back pain. States that the patient was referred to Dr Kathyrn Sheriff and seen on 04/08/18 for the same issue. Would like to know if this referral is needed. Please advise. CB#: [phone]

## 2018-04-20 ENCOUNTER — Telehealth: Payer: Self-pay | Admitting: *Deleted

## 2018-04-20 NOTE — Telephone Encounter (Signed)
Left message on machine for patient.  Patient will have to call the neurology office to switch providers.

## 2018-04-20 NOTE — Telephone Encounter (Signed)
Left detailed message on machine for Leah for patient to see Dr Ellene Route

## 2018-04-22 ENCOUNTER — Ambulatory Visit (INDEPENDENT_AMBULATORY_CARE_PROVIDER_SITE_OTHER): Payer: BLUE CROSS/BLUE SHIELD | Admitting: *Deleted

## 2018-04-22 DIAGNOSIS — E349 Endocrine disorder, unspecified: Secondary | ICD-10-CM | POA: Diagnosis not present

## 2018-04-22 MED ORDER — TESTOSTERONE CYPIONATE 200 MG/ML IM SOLN
200.0000 mg | Freq: Once | INTRAMUSCULAR | Status: AC
  Administered 2018-04-22: 200 mg via INTRAMUSCULAR

## 2018-04-22 NOTE — Progress Notes (Signed)
Per orders of Dr. Elease Hashimoto, injection of testosterone given by Dorrene German. Patient tolerated injection well.

## 2018-04-22 NOTE — Telephone Encounter (Signed)
Spoke with patient and he has called Dr Clarice Pole office

## 2018-04-25 ENCOUNTER — Telehealth (INDEPENDENT_AMBULATORY_CARE_PROVIDER_SITE_OTHER): Payer: Self-pay | Admitting: Orthopedic Surgery

## 2018-04-25 NOTE — Telephone Encounter (Signed)
Patient called and would like a call back from you, he said it was too long of a message to give me to deliver to you. Patients # [phone]

## 2018-04-25 NOTE — Telephone Encounter (Signed)
I called and Jon Spencer and he states that he was refereed to a neurosurgeon for eval of a tumor of the L spine. He did not feel confident in this doctor and wanted to change to Dr. Ellene Route who did his wife's surgery. He was told that he was not allowed to change doctors and must seek treatment at [facility] if he did not want to continue care with the surgeon he was assigned. He is wanting to know if Dr. Sharol Given can help. I advised Dr. Sharol Given is in a conference until Thursday but that I will hold this message and discuss with him and call him back on Thursday.

## 2018-04-28 NOTE — Telephone Encounter (Signed)
Called pt and advised that Dr. Sharol Given [facility]. He can not change how another office practices but understands his frustration. Pt advised that he will call back and let us know if he would like for [place] to proceed with a referral after he has spoken with his wife and gets back into town.

## 2018-04-29 ENCOUNTER — Telehealth (INDEPENDENT_AMBULATORY_CARE_PROVIDER_SITE_OTHER): Payer: Self-pay | Admitting: Orthopedic Surgery

## 2018-04-29 NOTE — Telephone Encounter (Signed)
Patient called for a referral?. Ask if we could assist him he requested a return call from [place]  [phone]

## 2018-04-29 NOTE — Telephone Encounter (Signed)
[facility] referral? Will hold for Dr. Sharol Given to advise.

## 2018-05-02 ENCOUNTER — Other Ambulatory Visit (INDEPENDENT_AMBULATORY_CARE_PROVIDER_SITE_OTHER): Payer: Self-pay

## 2018-05-02 DIAGNOSIS — M5416 Radiculopathy, lumbar region: Secondary | ICD-10-CM

## 2018-05-02 NOTE — Telephone Encounter (Signed)
This pt is needing a semi urgent referral to [facility] neurosurgical group for an ependymoma tumor of the L spine. The pt is very nervous and if we could set this up asap please. Just call the pt and let him know when you have sent the information. Thank you so much!

## 2018-05-03 NOTE — Telephone Encounter (Signed)
I C [facility] and left message with Becky Dr. Kathyrn Sheriff assistant to return my call to see if Nund[facility] can release pt to Dr. Ellene Route. Pending call back from her.

## 2018-05-03 NOTE — Telephone Encounter (Signed)
[place] with [facility] and she stated that pt on June 24th was very upset with her because she could not get pt in with [facility] and she tried to explain to him the reason he was with with nundkumar d/t referral was urgent and they placed him with the doctor on call and 2 days he was seen. Dr. Kathyrn Sheriff has not released him to be seen by another doctor in that facility d/t he was the on call doctor. Jacqlyn Larsen said he got really upset with her so she sent him to her office manager Hailey to talk to him to help him better understand reason. Pt is not taking no for an answer. He is wanting to see elsner. Jacqlyn Larsen states she will send it back to office manager to see if can see elsner and if so and he happens to have surgery and something goes wrong and Kathyrn Sheriff is on call he will have to see that provider. Jacqlyn Larsen is going to call me in the am to let me know what the status is on seeing Els[facility].

## 2018-05-03 NOTE — Telephone Encounter (Signed)
I faxed over the request to [facility] neurosurgery and then I contacted pt and he stated he did not want to go to [facility] that is trying to see if he can work out to go back to [facility] to see [facility] only, he DOES NOT want to see Nundkumar period, and if they can get him scheduled with [facility] then he will see him. If not then he will try to go to [facility] since I have already sent the referral. I told pt I will try to see if I can try to do my part and talk to someone over at [facility] and get him scheduled with elsner. Dr. Kathyrn Sheriff has to release pt to see [facility] first and pt states Kathyrn Sheriff will not do it. I am going to call and see what I can do for this pt.

## 2018-05-06 NOTE — Telephone Encounter (Signed)
Received call back from [facility] with [facility] stating she has spoke with her office manager about trying to get pt transferred from [facility] to [place] and Dr Kathyrn Sheriff will NOT release him to see [facility] and that pt will need to be seen out of their practice.

## 2018-05-06 NOTE — Telephone Encounter (Signed)
IC pt advised him of the situation below and he was not happy states he will contact the medical director with [facility] and also email [facility] but states he does not want to go anywhere outside of [facility] to be seen. Pt is going to get with his wife discuss with her and try to see what needs to be done and maybe call around himself . Pt states will call me back either Mon/Tues on their decision.

## 2018-05-18 ENCOUNTER — Other Ambulatory Visit (INDEPENDENT_AMBULATORY_CARE_PROVIDER_SITE_OTHER): Payer: Self-pay | Admitting: Orthopedic Surgery

## 2018-06-01 DIAGNOSIS — D497 Neoplasm of unspecified behavior of endocrine glands and other parts of nervous system: Secondary | ICD-10-CM | POA: Diagnosis not present

## 2018-06-01 DIAGNOSIS — Z6830 Body mass index (BMI) 30.0-30.9, adult: Secondary | ICD-10-CM | POA: Diagnosis not present

## 2018-06-01 DIAGNOSIS — I10 Essential (primary) hypertension: Secondary | ICD-10-CM | POA: Diagnosis not present

## 2018-06-03 ENCOUNTER — Other Ambulatory Visit: Payer: Self-pay | Admitting: Neurological Surgery

## 2018-06-10 ENCOUNTER — Ambulatory Visit (INDEPENDENT_AMBULATORY_CARE_PROVIDER_SITE_OTHER): Payer: BLUE CROSS/BLUE SHIELD | Admitting: *Deleted

## 2018-06-10 DIAGNOSIS — E349 Endocrine disorder, unspecified: Secondary | ICD-10-CM | POA: Diagnosis not present

## 2018-06-10 MED ORDER — TESTOSTERONE CYPIONATE 200 MG/ML IM SOLN
200.0000 mg | Freq: Once | INTRAMUSCULAR | Status: AC
  Administered 2018-06-10: 200 mg via INTRAMUSCULAR

## 2018-06-10 NOTE — Progress Notes (Signed)
Per orders of Dr. Elease Hashimoto, injection of testosterone 200 mg given by Dorrene German. Patient tolerated injection well.

## 2018-06-14 ENCOUNTER — Telehealth: Payer: Self-pay | Admitting: Family Medicine

## 2018-06-14 NOTE — Telephone Encounter (Signed)
Copied from [place] [id number]. Topic: Inquiry >> Jun 14, 2018  8:24 AM Scherrie Gerlach wrote: Reason for CRM: pt would like Dr Elease Hashimoto to call him personally, not his nurse (per pt requested) Pt states the nurse that gave him his testosterone injection on Friday gave him the injection in his side hip that left him in so much pain he missed work a day and had to take hydrocodone for the pain. (3 days) Pt states he questioned the nurse when she started the injection, because he had never gotten one in that area before. Pt is very upset, and made it very clear he does not want anyone except Dr Elease Hashimoto to call him.

## 2018-06-15 NOTE — Telephone Encounter (Signed)
I spoke with him regarding this on 8-13.  Will discuss with nurse.

## 2018-06-24 ENCOUNTER — Other Ambulatory Visit: Payer: Self-pay | Admitting: Family Medicine

## 2018-06-28 ENCOUNTER — Encounter (HOSPITAL_COMMUNITY): Payer: Self-pay

## 2018-06-28 NOTE — Pre-Procedure Instructions (Signed)
Jon Spencer  06/28/2018      [id number] [place], [place] - [address] [place] [address] Phone: [phone] Fax: [place], [place] [address] [place] [address] Phone: [phone] Fax: [phone]    Your procedure is scheduled on Sept. 9  Report to [facility] Admitting at 5:30 A.M.  Call this number if you have problems the morning of surgery:  [phone]   Remember:  Do not eat or drink after midnight.                       Take these medicines the morning of surgery with A SIP OF WATER :             Amlodipine (norvasc)            Hydrocodone if needed            Prednisone (deltasone)                        7 days prior to surgery STOP taking any Aspirin(unless otherwise instructed by your surgeon), Aleve, Naproxen, Ibuprofen, Motrin, Advil, Goody's, BC's, all herbal medications, fish oil, and all vitamins    Do not wear jewelry.  Do not wear lotions, powders, or perfumes, or deodorant.  Do not shave 48 hours prior to surgery.  Men may shave face and neck.  Do not bring valuables to the hospital.  [facility] is not responsible for any belongings or valuables.  Contacts, dentures or bridgework may not be worn into surgery.  Leave your suitcase in the car.  After surgery it may be brought to your room.  For patients admitted to the hospital, discharge time will be determined by your treatment team.  Patients discharged the day of surgery will not be allowed to drive home.    Special instructions:  [facility]- Preparing For Surgery  Before surgery, you can play an important role. Because skin is not sterile, your skin needs to be as free of germs as possible. You can reduce the number of germs on your skin by washing with CHG (chlorahexidine gluconate) Soap before surgery.  CHG is an antiseptic  cleaner which kills germs and bonds with the skin to continue killing germs even after washing.    Oral Hygiene is also important to reduce your risk of infection.  Remember - BRUSH YOUR TEETH THE MORNING OF SURGERY WITH YOUR REGULAR TOOTHPASTE  Please do not use if you have an allergy to CHG or antibacterial soaps. If your skin becomes reddened/irritated stop using the CHG.  Do not shave (including legs and underarms) for at least 48 hours prior to first CHG shower. It is OK to shave your face.  Please follow these instructions carefully.   1. Shower the NIGHT BEFORE SURGERY and the MORNING OF SURGERY with CHG.   2. If you chose to wash your hair, wash your hair first as usual with your normal shampoo.  3. After you shampoo, rinse your hair and body thoroughly to remove the shampoo.  4. Use CHG as you would any other liquid soap. You can apply CHG directly to the skin and wash gently with a scrungie or a clean washcloth.   5. Apply the CHG Soap to your body ONLY FROM  THE NECK DOWN.  Do not use on open wounds or open sores. Avoid contact with your eyes, ears, mouth and genitals (private parts). Wash Face and genitals (private parts)  with your normal soap.  6. Wash thoroughly, paying special attention to the area where your surgery will be performed.  7. Thoroughly rinse your body with warm water from the neck down.  8. DO NOT shower/wash with your normal soap after using and rinsing off the CHG Soap.  9. Pat yourself dry with a CLEAN TOWEL.  10. Wear CLEAN PAJAMAS to bed the night before surgery, wear comfortable clothes the morning of surgery  11. Place CLEAN SHEETS on your bed the night of your first shower and DO NOT SLEEP WITH PETS.    Day of Surgery:  Do not apply any deodorants/lotions.  Please wear clean clothes to the hospital/surgery center.   Remember to brush your teeth WITH YOUR REGULAR TOOTHPASTE.    Please read over the following fact sheets that you were  given. Coughing and Deep Breathing, MRSA Information and Surgical Site Infection Prevention

## 2018-06-29 ENCOUNTER — Encounter (HOSPITAL_COMMUNITY): Payer: Self-pay

## 2018-06-29 ENCOUNTER — Other Ambulatory Visit: Payer: Self-pay

## 2018-06-29 ENCOUNTER — Encounter (HOSPITAL_COMMUNITY)
Admission: RE | Admit: 2018-06-29 | Discharge: 2018-06-29 | Disposition: A | Discharge status: Home / Self Care | Payer: BLUE CROSS/BLUE SHIELD | Source: Ambulatory Visit | Attending: Neurological Surgery | Admitting: Neurological Surgery

## 2018-06-29 DIAGNOSIS — Z01812 Encounter for preprocedural laboratory examination: Secondary | ICD-10-CM | POA: Diagnosis not present

## 2018-06-29 DIAGNOSIS — Z01810 Encounter for preprocedural cardiovascular examination: Secondary | ICD-10-CM | POA: Diagnosis not present

## 2018-06-29 HISTORY — DX: Other complications of anesthesia, initial encounter: T88.59XA

## 2018-06-29 HISTORY — DX: Adverse effect of unspecified anesthetic, initial encounter: T41.45XA

## 2018-06-29 HISTORY — DX: Personal history of urinary calculi: Z87.442

## 2018-06-29 LAB — TYPE AND SCREEN
ABO/RH(D): O POS
Antibody Screen: NEGATIVE

## 2018-06-29 LAB — BASIC METABOLIC PANEL
Anion gap: 10 (ref 5–15)
BUN: 17 mg/dL (ref 8–23)
CO2: 24 mmol/L (ref 22–32)
Calcium: 9.5 mg/dL (ref 8.9–10.3)
Chloride: 107 mmol/L (ref 98–111)
Creatinine, Ser: 1.23 mg/dL (ref 0.61–1.24)
GFR calc Af Amer: >60 mL/min (ref >60)
GFR calc non Af Amer: 59 mL/min — ABNORMAL LOW (ref >60)
Glucose, Bld: 110 mg/dL — ABNORMAL HIGH (ref 70–99)
Potassium: 3.8 mmol/L (ref 3.5–5.1)
Sodium: 141 mmol/L (ref 135–145)

## 2018-06-29 LAB — SURGICAL PCR SCREEN
MRSA, PCR: NEGATIVE
Staphylococcus aureus: NEGATIVE

## 2018-06-29 LAB — CBC
HCT: 51.5 % (ref 39.0–52.0)
Hemoglobin: 16.8 g/dL (ref 13.0–17.0)
MCH: 32.4 pg (ref 26.0–34.0)
MCHC: 32.6 g/dL (ref 30.0–36.0)
MCV: 99.2 fL (ref 78.0–100.0)
Platelets: 285 10*3/uL (ref 150–400)
RBC: 5.19 MIL/uL (ref 4.22–5.81)
RDW: 13.4 % (ref 11.5–15.5)
WBC: 9.9 10*3/uL (ref 4.0–10.5)

## 2018-06-29 LAB — ABO/RH: ABO/RH(D): O POS

## 2018-06-29 NOTE — Progress Notes (Signed)
PCP:  Dr. [name] @ [facility]  10-12 yrs. Ago had a stress test, normal. Pt. States he doesn't remember why he had it done. ( Dr. Daneen Schick)

## 2018-07-03 DIAGNOSIS — Z9889 Other specified postprocedural states: Secondary | ICD-10-CM

## 2018-07-03 HISTORY — DX: Other specified postprocedural states: Z98.890

## 2018-07-11 NOTE — Anesthesia Preprocedure Evaluation (Addendum)
Anesthesia Evaluation  Patient identified by MRN, date of birth, ID band Patient awake    Reviewed: Allergy & Precautions, NPO status , Patient's Chart, lab work & pertinent test results  History of Anesthesia Complications (+) PROLONGED EMERGENCE  Airway Mallampati: II  TM Distance: >3 FB Neck ROM: Full    Dental  (+) Chipped, Dental Advisory Given   Pulmonary neg pulmonary ROS,    breath sounds clear to auscultation       Cardiovascular hypertension, Pt. on medications (-) angina Rhythm:Regular Rate:Normal     Neuro/Psych PSYCHIATRIC DISORDERS (ADD) Intradural tumor: L3,4 ependymoma with cord compression tinnitus    GI/Hepatic negative GI ROS, Neg liver ROS,   Endo/Other  negative endocrine ROS  Renal/GU negative Renal ROS     Musculoskeletal  (+) Arthritis , Osteoarthritis,    Abdominal   Peds  Hematology negative hematology ROS (+)   Anesthesia Other Findings   Reproductive/Obstetrics                            Anesthesia Physical Anesthesia Plan  ASA: II  Anesthesia Plan: General   Post-op Pain Management:    Induction: Intravenous  PONV Risk Score and Plan: 3 and Ondansetron, Dexamethasone and Diphenhydramine  Airway Management Planned: Oral ETT  Additional Equipment:   Intra-op Plan:   Post-operative Plan: Extubation in OR  Informed Consent: I have reviewed the patients History and Physical, chart, labs and discussed the procedure including the risks, benefits and alternatives for the proposed anesthesia with the patient or authorized representative who has indicated his/her understanding and acceptance.   Dental advisory given  Plan Discussed with: CRNA and Surgeon  Anesthesia Plan Comments: (Plan routine monitors, GETA)       Anesthesia Quick Evaluation

## 2018-07-12 ENCOUNTER — Inpatient Hospital Stay (HOSPITAL_COMMUNITY): Payer: BLUE CROSS/BLUE SHIELD

## 2018-07-12 ENCOUNTER — Other Ambulatory Visit: Payer: Self-pay

## 2018-07-12 ENCOUNTER — Inpatient Hospital Stay (HOSPITAL_COMMUNITY)
Admission: RE | Disposition: A | Discharge status: Home / Self Care | Payer: Self-pay | Source: Home / Self Care | Attending: Neurological Surgery

## 2018-07-12 ENCOUNTER — Inpatient Hospital Stay (HOSPITAL_COMMUNITY)
Admission: RE | Admit: 2018-07-12 | Discharge: 2018-07-13 | DRG: 520 | Disposition: A | Discharge status: Home / Self Care | Payer: BLUE CROSS/BLUE SHIELD | Attending: Neurological Surgery | Admitting: Neurological Surgery

## 2018-07-12 ENCOUNTER — Inpatient Hospital Stay (HOSPITAL_COMMUNITY): Payer: BLUE CROSS/BLUE SHIELD | Admitting: Anesthesiology

## 2018-07-12 ENCOUNTER — Encounter (HOSPITAL_COMMUNITY): Payer: Self-pay

## 2018-07-12 DIAGNOSIS — D434 Neoplasm of uncertain behavior of spinal cord: Secondary | ICD-10-CM | POA: Diagnosis not present

## 2018-07-12 DIAGNOSIS — E785 Hyperlipidemia, unspecified: Secondary | ICD-10-CM | POA: Diagnosis not present

## 2018-07-12 DIAGNOSIS — Z7952 Long term (current) use of systemic steroids: Secondary | ICD-10-CM

## 2018-07-12 DIAGNOSIS — C720 Malignant neoplasm of spinal cord: Secondary | ICD-10-CM | POA: Diagnosis not present

## 2018-07-12 DIAGNOSIS — Z79890 Hormone replacement therapy (postmenopausal): Secondary | ICD-10-CM | POA: Diagnosis not present

## 2018-07-12 DIAGNOSIS — D3610 Benign neoplasm of peripheral nerves and autonomic nervous system, unspecified: Secondary | ICD-10-CM | POA: Diagnosis not present

## 2018-07-12 DIAGNOSIS — I10 Essential (primary) hypertension: Secondary | ICD-10-CM | POA: Diagnosis present

## 2018-07-12 DIAGNOSIS — Z886 Allergy status to analgesic agent status: Secondary | ICD-10-CM

## 2018-07-12 DIAGNOSIS — Z87442 Personal history of urinary calculi: Secondary | ICD-10-CM | POA: Diagnosis not present

## 2018-07-12 DIAGNOSIS — Z79899 Other long term (current) drug therapy: Secondary | ICD-10-CM | POA: Diagnosis not present

## 2018-07-12 DIAGNOSIS — Z888 Allergy status to other drugs, medicaments and biological substances status: Secondary | ICD-10-CM | POA: Diagnosis not present

## 2018-07-12 DIAGNOSIS — D751 Secondary polycythemia: Secondary | ICD-10-CM | POA: Diagnosis not present

## 2018-07-12 DIAGNOSIS — Z419 Encounter for procedure for purposes other than remedying health state, unspecified: Secondary | ICD-10-CM

## 2018-07-12 DIAGNOSIS — Z8249 Family history of ischemic heart disease and other diseases of the circulatory system: Secondary | ICD-10-CM

## 2018-07-12 DIAGNOSIS — D334 Benign neoplasm of spinal cord: Secondary | ICD-10-CM | POA: Diagnosis not present

## 2018-07-12 DIAGNOSIS — D492 Neoplasm of unspecified behavior of bone, soft tissue, and skin: Secondary | ICD-10-CM | POA: Diagnosis not present

## 2018-07-12 HISTORY — PX: LAMINECTOMY: SHX219

## 2018-07-12 SURGERY — LUMBAR LAMINECTOMY FOR TUMOR
Anesthesia: General | Site: Back

## 2018-07-12 MED ORDER — SODIUM CHLORIDE 0.9% FLUSH
3.0000 mL | Freq: Two times a day (BID) | INTRAVENOUS | Status: DC
  Administered 2018-07-12: 3 mL via INTRAVENOUS

## 2018-07-12 MED ORDER — HYDROMORPHONE HCL 1 MG/ML IJ SOLN
0.2500 mg | INTRAMUSCULAR | Status: DC | PRN
  Administered 2018-07-12: 0.5 mg via INTRAVENOUS

## 2018-07-12 MED ORDER — THROMBIN 20000 UNITS EX KIT
PACK | CUTANEOUS | Status: AC
  Filled 2018-07-12: qty 1

## 2018-07-12 MED ORDER — MEPERIDINE HCL 50 MG/ML IJ SOLN: 6.2500 mg | INTRAMUSCULAR | Status: DC | PRN

## 2018-07-12 MED ORDER — LIDOCAINE-EPINEPHRINE 1 %-1:100000 IJ SOLN
INTRAMUSCULAR | Status: DC | PRN
  Administered 2018-07-12: 5 mL

## 2018-07-12 MED ORDER — PROMETHAZINE HCL 25 MG/ML IJ SOLN: 6.2500 mg | INTRAMUSCULAR | Status: DC | PRN

## 2018-07-12 MED ORDER — HYDROCODONE-ACETAMINOPHEN 5-325 MG PO TABS
1.0000 | ORAL_TABLET | Freq: Four times a day (QID) | ORAL | Status: DC | PRN
  Administered 2018-07-12: 2 via ORAL
  Filled 2018-07-12: qty 2

## 2018-07-12 MED ORDER — SODIUM CHLORIDE 0.9 % IV SOLN
INTRAVENOUS | Status: DC | PRN
  Administered 2018-07-12 (×2): 50 ug/min via INTRAVENOUS

## 2018-07-12 MED ORDER — ALUM & MAG HYDROXIDE-SIMETH 200-200-20 MG/5ML PO SUSP: 30.0000 mL | Freq: Four times a day (QID) | ORAL | Status: DC | PRN

## 2018-07-12 MED ORDER — ONDANSETRON HCL 4 MG PO TABS: 4.0000 mg | ORAL_TABLET | Freq: Four times a day (QID) | ORAL | Status: DC | PRN

## 2018-07-12 MED ORDER — FENTANYL CITRATE (PF) 100 MCG/2ML IJ SOLN
INTRAMUSCULAR | Status: DC | PRN
  Administered 2018-07-12 (×3): 50 ug via INTRAVENOUS

## 2018-07-12 MED ORDER — LIDOCAINE 2% (20 MG/ML) 5 ML SYRINGE
INTRAMUSCULAR | Status: AC
  Filled 2018-07-12: qty 5

## 2018-07-12 MED ORDER — HYDROMORPHONE HCL 1 MG/ML IJ SOLN
INTRAMUSCULAR | Status: AC
  Filled 2018-07-12: qty 1

## 2018-07-12 MED ORDER — PROPOFOL 10 MG/ML IV BOLUS
INTRAVENOUS | Status: DC | PRN
  Administered 2018-07-12: 200 mg via INTRAVENOUS

## 2018-07-12 MED ORDER — ONDANSETRON HCL 4 MG/2ML IJ SOLN
INTRAMUSCULAR | Status: DC | PRN
  Administered 2018-07-12: 4 mg via INTRAVENOUS

## 2018-07-12 MED ORDER — PHENOL 1.4 % MT LIQD: 1.0000 | OROMUCOSAL | Status: DC | PRN

## 2018-07-12 MED ORDER — MIDAZOLAM HCL 2 MG/2ML IJ SOLN
INTRAMUSCULAR | Status: AC
  Filled 2018-07-12: qty 2

## 2018-07-12 MED ORDER — MORPHINE SULFATE (PF) 2 MG/ML IV SOLN
2.0000 mg | INTRAVENOUS | Status: DC | PRN
  Filled 2018-07-12: qty 1

## 2018-07-12 MED ORDER — DEXAMETHASONE SODIUM PHOSPHATE 4 MG/ML IJ SOLN
INTRAMUSCULAR | Status: DC | PRN
  Administered 2018-07-12: 10 mg via INTRAVENOUS

## 2018-07-12 MED ORDER — DEXAMETHASONE SODIUM PHOSPHATE 10 MG/ML IJ SOLN
INTRAMUSCULAR | Status: AC
  Filled 2018-07-12: qty 1

## 2018-07-12 MED ORDER — LIDOCAINE 2% (20 MG/ML) 5 ML SYRINGE
INTRAMUSCULAR | Status: DC | PRN
  Administered 2018-07-12: 100 mg via INTRAVENOUS

## 2018-07-12 MED ORDER — FENTANYL CITRATE (PF) 250 MCG/5ML IJ SOLN
INTRAMUSCULAR | Status: AC
  Filled 2018-07-12: qty 5

## 2018-07-12 MED ORDER — CEFAZOLIN SODIUM-DEXTROSE 2-4 GM/100ML-% IV SOLN
2.0000 g | INTRAVENOUS | Status: AC
  Administered 2018-07-12: 2 g via INTRAVENOUS
  Filled 2018-07-12: qty 100

## 2018-07-12 MED ORDER — SODIUM CHLORIDE 0.9 % IV SOLN
250.0000 mL | INTRAVENOUS | Status: DC
  Administered 2018-07-12: 250 mL via INTRAVENOUS

## 2018-07-12 MED ORDER — ACETAMINOPHEN 650 MG RE SUPP: 650.0000 mg | RECTAL | Status: DC | PRN

## 2018-07-12 MED ORDER — THROMBIN 5000 UNITS EX SOLR
CUTANEOUS | Status: AC
  Filled 2018-07-12: qty 5000

## 2018-07-12 MED ORDER — AMLODIPINE BESYLATE 5 MG PO TABS
10.0000 mg | ORAL_TABLET | Freq: Every day | ORAL | Status: DC
  Administered 2018-07-13: 10 mg via ORAL
  Filled 2018-07-12: qty 2

## 2018-07-12 MED ORDER — BUPIVACAINE HCL (PF) 0.25 % IJ SOLN
INTRAMUSCULAR | Status: DC | PRN
  Administered 2018-07-12: 5 mL
  Administered 2018-07-12: 25 mL

## 2018-07-12 MED ORDER — SODIUM CHLORIDE 0.9 % IV SOLN: 250.0000 mL | INTRAVENOUS | Status: DC

## 2018-07-12 MED ORDER — HYDROCODONE-ACETAMINOPHEN 5-325 MG PO TABS
1.0000 | ORAL_TABLET | ORAL | Status: DC | PRN
  Administered 2018-07-12 – 2018-07-13 (×4): 1 via ORAL
  Filled 2018-07-12 (×4): qty 1

## 2018-07-12 MED ORDER — DEXAMETHASONE 4 MG PO TABS
2.0000 mg | ORAL_TABLET | Freq: Every day | ORAL | Status: DC
  Administered 2018-07-12 – 2018-07-13 (×2): 2 mg via ORAL
  Filled 2018-07-12 (×2): qty 1

## 2018-07-12 MED ORDER — HEMOSTATIC AGENTS (NO CHARGE) OPTIME
TOPICAL | Status: DC | PRN
  Administered 2018-07-12: 1 via TOPICAL

## 2018-07-12 MED ORDER — POLYETHYLENE GLYCOL 3350 17 G PO PACK: 17.0000 g | PACK | Freq: Every day | ORAL | Status: DC | PRN

## 2018-07-12 MED ORDER — ONDANSETRON HCL 4 MG/2ML IJ SOLN: 4.0000 mg | Freq: Four times a day (QID) | INTRAMUSCULAR | Status: DC | PRN

## 2018-07-12 MED ORDER — MIDAZOLAM HCL 5 MG/5ML IJ SOLN
INTRAMUSCULAR | Status: DC | PRN
  Administered 2018-07-12: 2 mg via INTRAVENOUS

## 2018-07-12 MED ORDER — PROPOFOL 500 MG/50ML IV EMUL
INTRAVENOUS | Status: DC | PRN
  Administered 2018-07-12: 50 ug/kg/min via INTRAVENOUS

## 2018-07-12 MED ORDER — SODIUM CHLORIDE 0.9 % IV SOLN
INTRAVENOUS | Status: DC | PRN
  Administered 2018-07-12: 09:00:00

## 2018-07-12 MED ORDER — SUCCINYLCHOLINE CHLORIDE 200 MG/10ML IV SOSY
PREFILLED_SYRINGE | INTRAVENOUS | Status: AC
  Filled 2018-07-12: qty 10

## 2018-07-12 MED ORDER — MIDAZOLAM HCL 2 MG/2ML IJ SOLN: 0.5000 mg | Freq: Once | INTRAMUSCULAR | Status: DC | PRN

## 2018-07-12 MED ORDER — BISACODYL 10 MG RE SUPP: 10.0000 mg | Freq: Every day | RECTAL | Status: DC | PRN

## 2018-07-12 MED ORDER — 0.9 % SODIUM CHLORIDE (POUR BTL) OPTIME
TOPICAL | Status: DC | PRN
  Administered 2018-07-12: 1000 mL

## 2018-07-12 MED ORDER — THROMBIN 20000 UNITS EX SOLR
CUTANEOUS | Status: DC | PRN
  Administered 2018-07-12: 09:00:00 via TOPICAL

## 2018-07-12 MED ORDER — ACETAMINOPHEN 325 MG PO TABS: 650.0000 mg | ORAL_TABLET | ORAL | Status: DC | PRN

## 2018-07-12 MED ORDER — SODIUM CHLORIDE 0.9% FLUSH: 3.0000 mL | INTRAVENOUS | Status: DC | PRN

## 2018-07-12 MED ORDER — SUCCINYLCHOLINE CHLORIDE 20 MG/ML IJ SOLN
INTRAMUSCULAR | Status: DC | PRN
  Administered 2018-07-12: 120 mg via INTRAVENOUS

## 2018-07-12 MED ORDER — METHOCARBAMOL 1000 MG/10ML IJ SOLN
500.0000 mg | Freq: Four times a day (QID) | INTRAVENOUS | Status: DC | PRN
  Filled 2018-07-12: qty 5

## 2018-07-12 MED ORDER — ONDANSETRON HCL 4 MG/2ML IJ SOLN
INTRAMUSCULAR | Status: AC
  Filled 2018-07-12: qty 2

## 2018-07-12 MED ORDER — OXYCODONE-ACETAMINOPHEN 5-325 MG PO TABS: 1.0000 | ORAL_TABLET | ORAL | Status: DC | PRN

## 2018-07-12 MED ORDER — MENTHOL 3 MG MT LOZG: 1.0000 | LOZENGE | OROMUCOSAL | Status: DC | PRN

## 2018-07-12 MED ORDER — FLEET ENEMA 7-19 GM/118ML RE ENEM: 1.0000 | ENEMA | Freq: Once | RECTAL | Status: DC | PRN

## 2018-07-12 MED ORDER — BUPIVACAINE HCL (PF) 0.25 % IJ SOLN
INTRAMUSCULAR | Status: AC
  Filled 2018-07-12: qty 30

## 2018-07-12 MED ORDER — LIDOCAINE-EPINEPHRINE 1 %-1:100000 IJ SOLN
INTRAMUSCULAR | Status: AC
  Filled 2018-07-12: qty 1

## 2018-07-12 MED ORDER — THROMBIN 5000 UNITS EX SOLR
OROMUCOSAL | Status: DC | PRN
  Administered 2018-07-12: 09:00:00 via TOPICAL

## 2018-07-12 MED ORDER — LOSARTAN POTASSIUM 50 MG PO TABS
100.0000 mg | ORAL_TABLET | Freq: Every day | ORAL | Status: DC
  Administered 2018-07-12 – 2018-07-13 (×2): 100 mg via ORAL
  Filled 2018-07-12: qty 2

## 2018-07-12 MED ORDER — METHOCARBAMOL 500 MG PO TABS
500.0000 mg | ORAL_TABLET | Freq: Four times a day (QID) | ORAL | Status: DC | PRN
  Administered 2018-07-12 – 2018-07-13 (×4): 500 mg via ORAL
  Filled 2018-07-12 (×4): qty 1

## 2018-07-12 MED ORDER — LACTATED RINGERS IV SOLN
INTRAVENOUS | Status: DC | PRN
  Administered 2018-07-12 (×2): via INTRAVENOUS

## 2018-07-12 MED ORDER — CHLORHEXIDINE GLUCONATE CLOTH 2 % EX PADS: 6.0000 | MEDICATED_PAD | Freq: Once | CUTANEOUS | Status: DC

## 2018-07-12 MED ORDER — PROPOFOL 10 MG/ML IV BOLUS
INTRAVENOUS | Status: AC
  Filled 2018-07-12: qty 20

## 2018-07-12 MED ORDER — DOCUSATE SODIUM 100 MG PO CAPS
100.0000 mg | ORAL_CAPSULE | Freq: Two times a day (BID) | ORAL | Status: DC
  Administered 2018-07-12 – 2018-07-13 (×2): 100 mg via ORAL
  Filled 2018-07-12 (×2): qty 1

## 2018-07-12 MED ORDER — PHENYLEPHRINE 40 MCG/ML (10ML) SYRINGE FOR IV PUSH (FOR BLOOD PRESSURE SUPPORT)
PREFILLED_SYRINGE | INTRAVENOUS | Status: DC | PRN
  Administered 2018-07-12 (×2): 80 ug via INTRAVENOUS

## 2018-07-12 MED ORDER — SENNA 8.6 MG PO TABS
1.0000 | ORAL_TABLET | Freq: Two times a day (BID) | ORAL | Status: DC
  Administered 2018-07-12 – 2018-07-13 (×2): 8.6 mg via ORAL
  Filled 2018-07-12 (×2): qty 1

## 2018-07-12 SURGICAL SUPPLY — 87 items
ADH SKN CLS APL DERMABOND .7 (GAUZE/BANDAGES/DRESSINGS) ×1
AGENT HMST KT MTR STRL THRMB (HEMOSTASIS) ×1
APL SKNCLS STERI-STRIP NONHPOA (GAUZE/BANDAGES/DRESSINGS)
BAG DECANTER FOR FLEXI CONT (MISCELLANEOUS) ×2 IMPLANT
BENZOIN TINCTURE PRP APPL 2/3 (GAUZE/BANDAGES/DRESSINGS) IMPLANT
BLADE CLIPPER SURG (BLADE) ×2 IMPLANT
BLADE SURG 11 STRL SS (BLADE) ×1 IMPLANT
BUR MATCHSTICK NEURO 3.0 LAGG (BURR) ×2 IMPLANT
CANISTER SUCT 3000ML PPV (MISCELLANEOUS) ×2 IMPLANT
CARTRIDGE OIL MAESTRO DRILL (MISCELLANEOUS) ×1 IMPLANT
CATH ROBINSON RED A/P 10FR (CATHETERS) IMPLANT
CLIP VESOCCLUDE MED 6/CT (CLIP) IMPLANT
DECANTER SPIKE VIAL GLASS SM (MISCELLANEOUS) ×2 IMPLANT
DERMABOND ADVANCED (GAUZE/BANDAGES/DRESSINGS) ×1
DERMABOND ADVANCED .7 DNX12 (GAUZE/BANDAGES/DRESSINGS) IMPLANT
DEVICE DISSECT PLASMABLAD 3.0S (MISCELLANEOUS) IMPLANT
DIFFUSER DRILL AIR PNEUMATIC (MISCELLANEOUS) ×1 IMPLANT
DRAPE LAPAROTOMY 100X72 PEDS (DRAPES) IMPLANT
DRAPE LAPAROTOMY 100X72X124 (DRAPES) ×2 IMPLANT
DRAPE MICROSCOPE LEICA (MISCELLANEOUS) ×1 IMPLANT
DRAPE POUCH INSTRU U-SHP 10X18 (DRAPES) ×1 IMPLANT
DURAPREP 26ML APPLICATOR (WOUND CARE) ×2 IMPLANT
ELECT REM PT RETURN 9FT ADLT (ELECTROSURGICAL) ×2
ELECTRODE REM PT RTRN 9FT ADLT (ELECTROSURGICAL) ×1 IMPLANT
FEE INTRAOP MONITOR IMPULS NCS (MISCELLANEOUS) IMPLANT
GAUZE 4X4 16PLY RFD (DISPOSABLE) IMPLANT
GAUZE SPONGE 4X4 12PLY STRL (GAUZE/BANDAGES/DRESSINGS) IMPLANT
GLOVE BIO SURGEON STRL SZ7.5 (GLOVE) IMPLANT
GLOVE BIOGEL PI IND STRL 6.5 (GLOVE) IMPLANT
GLOVE BIOGEL PI IND STRL 7.5 (GLOVE) IMPLANT
GLOVE BIOGEL PI IND STRL 8.5 (GLOVE) ×1 IMPLANT
GLOVE BIOGEL PI INDICATOR 6.5 (GLOVE) ×2
GLOVE BIOGEL PI INDICATOR 7.5 (GLOVE) ×2
GLOVE BIOGEL PI INDICATOR 8.5 (GLOVE) ×1
GLOVE ECLIPSE 7.0 STRL STRAW (GLOVE) ×1 IMPLANT
GLOVE ECLIPSE 7.5 STRL STRAW (GLOVE) ×1 IMPLANT
GLOVE ECLIPSE 8.5 STRL (GLOVE) ×2 IMPLANT
GLOVE EXAM NITRILE LRG STRL (GLOVE) IMPLANT
GLOVE EXAM NITRILE XL STR (GLOVE) IMPLANT
GLOVE EXAM NITRILE XS STR PU (GLOVE) IMPLANT
GLOVE SURG SS PI 6.5 STRL IVOR (GLOVE) ×4 IMPLANT
GOWN STRL REUS W/ TWL LRG LVL3 (GOWN DISPOSABLE) IMPLANT
GOWN STRL REUS W/ TWL XL LVL3 (GOWN DISPOSABLE) ×1 IMPLANT
GOWN STRL REUS W/TWL 2XL LVL3 (GOWN DISPOSABLE) ×2 IMPLANT
GOWN STRL REUS W/TWL LRG LVL3 (GOWN DISPOSABLE) ×8
GOWN STRL REUS W/TWL XL LVL3 (GOWN DISPOSABLE)
HANDLE SUPERFICIAL 5IN (MISCELLANEOUS) ×1 IMPLANT
HEMOSTAT POWDER KIT SURGIFOAM (HEMOSTASIS) ×1 IMPLANT
HEMOSTAT SURGICEL 2X14 (HEMOSTASIS) ×1 IMPLANT
INTRAOP MONITOR FEE IMPULS NCS (MISCELLANEOUS) ×1
INTRAOP MONITOR FEE IMPULSE (MISCELLANEOUS) ×1
KIT BASIN OR (CUSTOM PROCEDURE TRAY) ×2 IMPLANT
KIT TURNOVER KIT B (KITS) ×2 IMPLANT
NDL SPNL 18GX3.5 QUINCKE PK (NEEDLE) IMPLANT
NEEDLE HYPO 22GX1.5 SAFETY (NEEDLE) ×2 IMPLANT
NEEDLE SPNL 18GX3.5 QUINCKE PK (NEEDLE) ×2 IMPLANT
NS IRRIG 1000ML POUR BTL (IV SOLUTION) ×2 IMPLANT
OIL CARTRIDGE MAESTRO DRILL (MISCELLANEOUS)
PACK LAMINECTOMY NEURO (CUSTOM PROCEDURE TRAY) ×1 IMPLANT
PAD ARMBOARD 7.5X6 YLW CONV (MISCELLANEOUS) ×7 IMPLANT
PATTIES SURGICAL .25X.25 (GAUZE/BANDAGES/DRESSINGS) ×1 IMPLANT
PATTIES SURGICAL .5 X1 (DISPOSABLE) ×2 IMPLANT
PATTIES SURGICAL .5 X3 (DISPOSABLE) IMPLANT
PATTIES SURGICAL 1/4 X 3 (GAUZE/BANDAGES/DRESSINGS) IMPLANT
PLASMABLADE 3.0S (MISCELLANEOUS) ×2
PROBE NERVBE PRASS .33 (MISCELLANEOUS) ×1 IMPLANT
RUBBERBAND STERILE (MISCELLANEOUS) ×2 IMPLANT
SPECIMEN JAR SMALL (MISCELLANEOUS) IMPLANT
SPONGE LAP 4X18 RFD (DISPOSABLE) IMPLANT
SPONGE SURGIFOAM ABS GEL 100 (HEMOSTASIS) ×2 IMPLANT
STAPLER SKIN PROX WIDE 3.9 (STAPLE) ×1 IMPLANT
STRIP CLOSURE SKIN 1/2X4 (GAUZE/BANDAGES/DRESSINGS) IMPLANT
SURGIFLO W/THROMBIN 8M KIT (HEMOSTASIS) ×1 IMPLANT
SUT NURALON 4 0 TR CR/8 (SUTURE) ×1 IMPLANT
SUT PDS AB 1 CTX 36 (SUTURE) IMPLANT
SUT PROLENE 6 0 BV (SUTURE) ×5 IMPLANT
SUT SILK 3 0 TIES 17X18 (SUTURE)
SUT SILK 3-0 18XBRD TIE BLK (SUTURE) IMPLANT
SUT VIC AB 1 CT1 18XBRD ANBCTR (SUTURE) ×1 IMPLANT
SUT VIC AB 1 CT1 8-18 (SUTURE) ×2
SUT VIC AB 2-0 CP2 18 (SUTURE) ×2 IMPLANT
SUT VIC AB 3-0 SH 8-18 (SUTURE) ×2 IMPLANT
SYR CONTROL 10ML LL (SYRINGE) ×1 IMPLANT
TOWEL GREEN STERILE (TOWEL DISPOSABLE) ×2 IMPLANT
TOWEL GREEN STERILE FF (TOWEL DISPOSABLE) ×2 IMPLANT
TRAY FOLEY MTR SLVR 16FR STAT (SET/KITS/TRAYS/PACK) ×1 IMPLANT
WATER STERILE IRR 1000ML POUR (IV SOLUTION) ×2 IMPLANT

## 2018-07-12 NOTE — Anesthesia Postprocedure Evaluation (Signed)
Anesthesia Post Note  Patient: Jon Spencer  Procedure(s) Performed: Lumbar three-four Laminectomy for resection of intradural tumor (N/A Back)     Patient location during evaluation: PACU Anesthesia Type: General Level of consciousness: awake and alert, oriented and patient cooperative Pain management: pain level controlled Vital Signs Assessment: post-procedure vital signs reviewed and stable Respiratory status: spontaneous breathing, nonlabored ventilation and respiratory function stable Cardiovascular status: blood pressure returned to baseline and stable Postop Assessment: no apparent nausea or vomiting Anesthetic complications: no    Last Vitals:  Vitals:   07/12/18 1215 07/12/18 1242  BP:  (!) 145/81  Pulse: 77 77  Resp: 15 16  Temp:  36.5 C  SpO2: 95% 98%    Last Pain:  Vitals:   07/12/18 1242  TempSrc: Oral  PainSc:                  Annaston Upham,E. Athina Fahey

## 2018-07-12 NOTE — Anesthesia Procedure Notes (Signed)
Procedure Name: Intubation Date/Time: 07/12/2018 7:46 AM Performed by: Lieutenant Diego, CRNA Pre-anesthesia Checklist: Patient identified, Emergency Drugs available, Suction available and Patient being monitored Patient Re-evaluated:Patient Re-evaluated prior to induction Oxygen Delivery Method: Circle system utilized Preoxygenation: Pre-oxygenation with 100% oxygen Induction Type: IV induction Ventilation: Mask ventilation without difficulty Laryngoscope Size: Miller and 2 Grade View: Grade I Tube type: Oral Tube size: 7.5 mm Number of attempts: 1 Airway Equipment and Method: Stylet and Oral airway Placement Confirmation: ETT inserted through vocal cords under direct vision,  positive ETCO2 and breath sounds checked- equal and bilateral Secured at: 24 cm Tube secured with: Tape Dental Injury: Teeth and Oropharynx as per pre-operative assessment

## 2018-07-12 NOTE — Evaluation (Signed)
Physical Therapy Evaluation Patient Details Name: Jon Spencer MRN: [id number] DOB: 1951/02/23 Today's Date: 07/12/2018   History of Present Illness  Patient is a 67 y/o male admitted with Intradural extra-axial mass at L3-L4. now s/p L3 and L4 laminectomy and decompression and excision of intradural extra-axial mass,.  PMH positive for HTN, HLD, OA.  Clinical Impression  Patient presents with limited mobility due to pain and back precautions.  Currently minguard to S level and wife and daughter in room assisting pt.  Feel will be safe for d/c home without current PT follow up with RW.  Patient may request outpatient PT later per physician preference.     Follow Up Recommendations No PT follow up    Equipment Recommendations  Rolling walker with 5" wheels    Recommendations for Other Services       Precautions / Restrictions Precautions Precautions: Fall;Back Precaution Booklet Issued: Yes (comment)      Mobility  Bed Mobility Overal bed mobility: Needs Assistance Bed Mobility: Sit to Sidelying;Rolling Rolling: Supervision       Sit to sidelying: Supervision General bed mobility comments: cues and assist for technique with log/roll back precautions  Transfers Overall transfer level: Needs assistance Equipment used: Rolling walker (2 wheeled) Transfers: Sit to/from Stand Sit to Stand: Supervision         General transfer comment: heavy UE use from recliner, increased time and pain for stand to sit  Ambulation/Gait Ambulation/Gait assistance: Supervision Gait Distance (Feet): 200 Feet Assistive device: Rolling walker (2 wheeled) Gait Pattern/deviations: Step-through pattern;Decreased stride length     General Gait Details: slow but steady pace, education about walking program at home and frequent breaks from sitting  Stairs            Wheelchair Mobility    Modified Rankin (Stroke Patients Only)       Balance Overall balance assessment: Mild  deficits observed, not formally tested                                           Pertinent Vitals/Pain Pain Assessment: Faces Faces Pain Scale: Hurts even more Pain Location: with stand to sit transition in mid back area Pain Descriptors / Indicators: Grimacing;Operative site guarding Pain Intervention(s): Monitored during session;Repositioned    Home Living Family/patient expects to be discharged to:: Private residence Living Arrangements: Spouse/significant other Available Help at Discharge: Family Type of Home: House Home Access: Stairs to enter   Technical brewer of Steps: 1 Home Layout: Able to live on main level with bedroom/bathroom Home Equipment: Bedside commode;Hospital bed      Prior Function Level of Independence: Independent         Comments: works in Lobby[employer]        Extremity/Trunk Assessment   Upper Extremity Assessment Upper Extremity Assessment: Overall WFL for tasks assessed    Lower Extremity Assessment Lower Extremity Assessment: Overall WFL for tasks assessed       Communication   Communication: No difficulties  Cognition Arousal/Alertness: Awake/alert Behavior During Therapy: WFL for tasks assessed/performed Overall Cognitive Status: Within Functional Limits for tasks assessed                                        General Comments General comments (skin  integrity, edema, etc.): Issued handout and reviewed with pt/wife about precautions; also educated on car transfers and demonstrated    Exercises     Assessment/Plan    PT Assessment Patent does not need any further PT services  PT Problem List         PT Treatment Interventions      PT Goals (Current goals can be found in the Care Plan section)  Acute Rehab PT Goals PT Goal Formulation: All assessment and education complete, DC therapy    Frequency     Barriers to discharge        Co-evaluation                AM-PAC PT "6 Clicks" Daily Activity  Outcome Measure Difficulty turning over in bed (including adjusting bedclothes, sheets and blankets)?: A Little Difficulty moving from lying on back to sitting on the side of the bed? : A Little Difficulty sitting down on and standing up from a chair with arms (e.g., wheelchair, bedside commode, etc,.)?: A Little Help needed moving to and from a bed to chair (including a wheelchair)?: A Little Help needed walking in hospital room?: A Little Help needed climbing 3-5 steps with a railing? : A Little 6 Click Score: 18    End of Session   Activity Tolerance: Patient tolerated treatment well Patient left: in bed;with call bell/phone within reach;with family/visitor present   PT Visit Diagnosis: Difficulty in walking, not elsewhere classified (R26.2)    Time: 6283-6629 PT Time Calculation (min) (ACUTE ONLY): 31 min   Charges:   PT Evaluation $PT Eval Low Complexity: 1 Low PT Treatments $Gait Training: 8-22 mins        Magda Kiel, [place] [phone] 07/12/2018   Reginia Naas 07/12/2018, 5:55 PM

## 2018-07-12 NOTE — Op Note (Signed)
Date of surgery: 07/12/2018 Preoperative diagnosis: Intradural extra-axial mass at L3-L4. Postoperative diagnosis: Same Procedure: L3 and L4 laminectomy and decompression and excision of intradural extra-axial mass, most likely schwannoma operating microscope, neuro monitoring including SSEPs and EMGs Surgeon: [name] First assistant: Consuella Lose MD Indications: Patient is a 67 year old individuals feeling pain and weakness in his buttocks and lower extremities.  He was found to have a large intradural extra-axial mass at the level of L3-L4.  This was felt to be either a schwannoma or ependymoma and he was advised regarding the need for resection.  There is now being undertaken.  Anesthesia: General endotracheal.  Procedure: Patient was brought to the operating room supine on the stretcher.  After the smooth induction of general endotracheal anesthesia a Foley catheter was placed and the patient was then turned prone on flat rolls onto the operating table.  The back was prepped with alcohol DuraPrep and draped in a sterile fashion.  Proper EMG and SSEP monitoring electrodes were placed.  Then a midline incision was created over L3 and L4 after localizing this area with a spinal needle and a radiograph.  Dissection was carried down to expose the spinous process of L3 and L4 and a self-retaining retractor was placed in the wound.  Then a complete laminectomy of L4 was undertaken and a partial laminectomy of L3 was undertaken undercutting substantially the inferior portion of the L3 lamina.  The dura was exposed and hemostasis was carefully obtained.  The dura was widened and again hemostasis in the lateral gutters was obtained meticulously.  Once this was achieved the dura was opened with a midline durotomy using a 15 blade.  A 6-0 Prolene was then used to tent the dura back to either side of the laminectomy defect.  The arachnoid had a small puncture and this released some CSF.  Through the CSF  though we could see the tumor at L3 and L4.  The operating microscope was placed over the field and the rest of the procedure was carried out under microscopic magnification.  At this point Dr. Kathyrn Sheriff assisted with peeling back some of the arachnoid to expose the tumor.  As the tumor was examined there was noted to be what appeared to be a root splayed through the tumor from the cephalad edge to the inferior edge where he gathered.  Stimulation of this route revealed function in the extensor houses longus on the left side.  Even at low occurrence it was apparent that this was intrinsic to the tumor itself.  Further dissection yielded the cephalad corner of the tumor and once this was isolated a suture ligature was passed around the superior portion of the tumor.  This was then cauterized and suture ligature was tightened.  The tumor was then amputated at this region and it was rolled back.  The inferior portion of the root was then cauterized and released.  The tumor was delivered in a single mass.  That was the only singular root that was identified within it.  The area was irrigated copiously with saline.  At the stump there was some bleeding that had occurred and was felt that a portion of the tumor may extend into the suture therefore the proximal portion was cauterized and amputated above the ligature.  After bleeding was well controlled and the bed was completely dried the dura was closed with 6-0 Prolene in a running fashion.  Then the microscope was removed a layer of Gelfoam was laid over the dura  once it was identified that the dura was likely closed and no leakage had occurred to a pressure of 30 cm of water.  Next the lumbodorsal fascia was closed with #1 Vicryl 2-0 Vicryl was used in the subtenons tissues and 3-0 Vicryl was used to close subcuticular skin.  Blood loss for the entire procedure was estimated at less than 200 cc.  25 cc of half percent Marcaine was injected into the paraspinous fascia  closure.  The patient was then returned to recovery room in stable condition.

## 2018-07-12 NOTE — Plan of Care (Signed)
  Problem: Education: Goal: Ability to verbalize activity precautions or restrictions will improve Outcome: Progressing Goal: Knowledge of the prescribed therapeutic regimen will improve Outcome: Progressing Goal: Understanding of discharge needs will improve Outcome: Progressing   

## 2018-07-12 NOTE — Progress Notes (Signed)
Patient ID: Jon Spencer, male   DOB: [birth year], 67 y.o.   MRN: [id number] Vital signs are stable Motor function appears good in the lower extremities Even in the left extensor houses longus Patient notes some lower buttock pain but otherwise has been ambulatory and eating well Bladder function and control appears intact.

## 2018-07-12 NOTE — Transfer of Care (Signed)
Immediate Anesthesia Transfer of Care Note  Patient: Jon Spencer  Procedure(s) Performed: Lumbar three-four Laminectomy for resection of intradural tumor (N/A Back)  Patient Location: PACU  Anesthesia Type:General  Level of Consciousness: sedated  Airway & Oxygen Therapy: Patient Spontanous Breathing and Patient connected to face mask oxygen  Post-op Assessment: Report given to RN and Post -op Vital signs reviewed and stable  Post vital signs: Reviewed and stable  Last Vitals:  Vitals Value Taken Time  BP 135/89 07/12/2018 11:33 AM  Temp    Pulse 78 07/12/2018 11:34 AM  Resp 10 07/12/2018 11:34 AM  SpO2 96 % 07/12/2018 11:34 AM  Vitals shown include unvalidated device data.  Last Pain:  Vitals:   07/12/18 0608  TempSrc: Oral  PainSc: 5       Patients Stated Pain Goal: 3 (52/84/13 2440)  Complications: No apparent anesthesia complications

## 2018-07-12 NOTE — H&P (Addendum)
Jon Spencer is an 67 y.o. male.   Chief Complaint: Bilateral lower extremity pain and hip pain HPI: Mr. Jon Spencer is a 67 year old individual who has had significant pain that started in his buttocks and hips and radiates into his lower extremities he was found to have a large tumor in the spinal canal consistent with an ependymoma or neurofibroma at the level of L3-L4.  After careful consideration he was advised regarding surgical resection as his symptoms have become worse despite the use of oral prednisone dose tapers which he has had on several occasions and he has been maintained on for the last month.  Past Medical History:  Diagnosis Date  . ADD 01/17/2010  . Complication of anesthesia    takes awhile to wake up  . History of kidney stones    pt. had 2 stones -20 yrs ago  . HYPERLIPIDEMIA 01/18/2009  . HYPERTENSION 01/18/2009  . Impotence of organic origin 10/01/2010  . OSTEOARTHRITIS, HAND 04/17/2010  . TINNITUS 06/26/2009  . TRIGGER FINGER 01/18/2009    Past Surgical History:  Procedure Laterality Date  . APPENDECTOMY  1969  . COLONOSCOPY    . VASECTOMY  1992    Family History  Problem Relation Age of Onset  . Arthritis Other   . Hyperlipidemia Other   . Hypertension Other   . Sudden death Other    Social History:  reports that he has never smoked. He has never used smokeless tobacco. He reports that he drinks about 1.0 - 4.0 standard drinks of alcohol per week. He reports that he does not use drugs.  Allergies:  Allergies  Allergen Reactions  . Tolectin [Tolmetin Sodium] Other (See Comments)    Fever 104, breathing problems  . Meloxicam Other (See Comments)    REACTION: Flushed, high temp  . Univasc [Moexipril Hydrochloride] Cough    coughing    Medications Prior to Admission  Medication Sig Dispense Refill  . amLODipine (NORVASC) 10 MG tablet Take 1 tablet (10 mg total) by mouth daily. 90 tablet 1  . HYDROcodone-acetaminophen (NORCO/VICODIN) 5-325 MG tablet  Take 1-2 tablets by mouth every 6 (six) hours as needed for moderate pain. 20 tablet 0  . losartan (COZAAR) 100 MG tablet TAKE 1 TABLET BY MOUTH EVERY DAY 90 tablet 0  . Multiple Vitamins-Minerals (MULTIVITAMIN ADULT) CHEW Chew 1 each by mouth daily.    . naproxen sodium (ALEVE) 220 MG tablet Take 220 mg by mouth daily as needed (pain).    . predniSONE (DELTASONE) 10 MG tablet TAKE 2 TABLETS (20 MG TOTAL) BY MOUTH DAILY WITH BREAKFAST. (Patient taking differently: Take 10 mg by mouth daily with breakfast. ) 30 tablet 0  . sildenafil (REVATIO) 20 MG tablet TAKE 2-5 TABLETS BY MOUTH ONCE DAILY AS NEEDED FOR SEXUAL ACTIVITY 100 tablet 1  . gabapentin (NEURONTIN) 300 MG capsule TAKE 1 CAPSULE BY MOUTH TWICE A DAY (Patient not taking: Reported on 06/21/2018) 60 capsule 1  . testosterone cypionate (DEPOTESTOSTERONE CYPIONATE) 200 MG/ML injection Inject 80mL (200mg ) into the muscle every 14 (fourteen) days. 10 mL 2    No results found for this or any previous visit (from the past 48 hour(s)). No results found.  Review of Systems  Constitutional: Negative.   HENT: Negative.   Eyes: Negative.   Respiratory: Negative.   Cardiovascular: Negative.   Gastrointestinal: Negative.   Genitourinary: Negative.   Musculoskeletal: Negative.   Skin: Negative.   Neurological: Positive for sensory change and focal weakness.  Hip pain  Endo/Heme/Allergies: Negative.   Psychiatric/Behavioral: Negative.     Blood pressure (!) 169/78, pulse 70, temperature 98 F (36.7 C), temperature source Oral, resp. rate 18, SpO2 99 %. Physical Exam  Constitutional: He is oriented to person, place, and time. He appears well-developed and well-nourished.  HENT:  Head: Normocephalic and atraumatic.  Eyes: Pupils are equal, round, and reactive to light. Conjunctivae and EOM are normal.  Neck: Normal range of motion. Neck supple.  Respiratory: Effort normal and breath sounds normal.  GI: Soft. Bowel sounds are normal.   Musculoskeletal:  Negative Patrick's maneuver negative straight leg raising  Neurological: He is alert and oriented to person, place, and time. He has normal reflexes.  Skin: Skin is warm and dry.  Psychiatric: He has a normal mood and affect. His behavior is normal. Judgment and thought content normal.     Assessment/Plan Intradural extra-axial lesion of the spinal canal at L3-4.  Planned resection of lesion via laminectomy Earleen Newport, MD 07/12/2018, 7:31 AM

## 2018-07-13 ENCOUNTER — Encounter (HOSPITAL_COMMUNITY): Payer: Self-pay | Admitting: Neurological Surgery

## 2018-07-13 MED ORDER — HYDROCODONE-ACETAMINOPHEN 5-325 MG PO TABS: 1.0000 | ORAL_TABLET | ORAL | 0 refills | Status: DC | PRN

## 2018-07-13 MED ORDER — DEXAMETHASONE 1 MG PO TABS: ORAL_TABLET | ORAL | 0 refills | Status: DC

## 2018-07-13 MED ORDER — METHOCARBAMOL 500 MG PO TABS: 500.0000 mg | ORAL_TABLET | Freq: Four times a day (QID) | ORAL | 3 refills | Status: DC | PRN

## 2018-07-13 MED FILL — Thrombin For Soln Kit 20000 Unit: CUTANEOUS | Qty: 1 | Status: AC

## 2018-07-13 NOTE — Evaluation (Addendum)
Occupational Therapy Evaluation and Discharge Patient Details Name: Jon Spencer MRN: [id number] DOB: [birth year] Today's Date: 07/13/2018    History of Present Illness Patient is a 67 y/o male admitted with Intradural extra-axial mass at L3-L4. now s/p L3 and L4 laminectomy and decompression and excision of intradural extra-axial mass,.  PMH positive for HTN, HLD, OA.   Clinical Impression   PTA patient independent and working.  He currently requires supervision for UB ADL, min assist for LB ADL, supervision for toilet transfers, and supervision for bed mobility.  Education on precautions, safety, mobility, recommendations and ADL compensatory techniques/AE. Patient with good adherence and recall of back precautions during session.  Will have 24/7 support of spouse at discharge, who is able to assist with ADLs as needed.  Based on performance today, no further OT needs identified and OT is signing off.  Thank you for this referral!     Follow Up Recommendations  No OT follow up;Supervision/Assistance - 24 hour    Equipment Recommendations  None recommended by OT    Recommendations for Other Services       Precautions / Restrictions Precautions Precautions: Fall;Back Precaution Booklet Issued: Yes (comment) Precaution Comments: reviewed with patient, issued by PT Restrictions Weight Bearing Restrictions: No      Mobility Bed Mobility Overal bed mobility: Needs Assistance Bed Mobility: Sit to Sidelying;Rolling;Sidelying to Sit Rolling: Supervision Sidelying to sit: Supervision     Sit to sidelying: Supervision General bed mobility comments: supervison for safety, no assist required and no cueing for precautions; attempted home height and setup of bed with supervision   Transfers Overall transfer level: Needs assistance Equipment used: None Transfers: Sit to/from Stand Sit to Stand: Supervision         General transfer comment: good technique and ahderance to  precautions     Balance Overall balance assessment: Mild deficits observed, not formally tested                                         ADL either performed or assessed with clinical judgement   ADL Overall ADL's : Needs assistance/impaired Eating/Feeding: Independent;Sitting   Grooming: Modified independent;Standing;Cueing for compensatory techniques   Upper Body Bathing: Set up;Sitting   Lower Body Bathing: Set up;Sit to/from stand;Cueing for compensatory techniques Lower Body Bathing Details (indicate cue type and reason): reviewed safety and back precautions, completing seated or having assistance/ using long sponge Upper Body Dressing : Set up;Sitting   Lower Body Dressing: Minimal assistance;Sit to/from stand;Cueing for compensatory techniques Lower Body Dressing Details (indicate cue type and reason): reviewed back precautions and technique; able to complete figure 4 technique but some assist or use of AE; spouse able to assist Toilet Transfer: Supervision/safety;Ambulation(simulated in room)   Toileting- Clothing Manipulation and Hygiene: Supervision/safety;Sit to/from stand;Cueing for back precautions;Cueing for compensatory techniques Toileting - Clothing Manipulation Details (indicate cue type and reason): reviewed compensatory techniques for safety and precautions Tub/ Shower Transfer: Walk-in shower;Supervision/safety;Ambulation;3 in 1 Tub/Shower Transfer Details (indicate cue type and reason): reviewed safety and using 3:1 seated Functional mobility during ADLs: Supervision/safety General ADL Comments: spouse is available and able to assist     Vision Baseline Vision/History: Wears glasses Vision Assessment?: No apparent visual deficits     Perception     Praxis      Pertinent Vitals/Pain Pain Assessment: Faces Faces Pain Scale: Hurts little more Pain Location: back Pain Descriptors /  Indicators: Grimacing;Operative site guarding Pain  Intervention(s): Monitored during session;Repositioned     Hand Dominance     Extremity/Trunk Assessment Upper Extremity Assessment Upper Extremity Assessment: Overall WFL for tasks assessed   Lower Extremity Assessment Lower Extremity Assessment: Defer to PT evaluation   Cervical / Trunk Assessment Cervical / Trunk Assessment: Other exceptions Cervical / Trunk Exceptions: s/p lumbar sx    Communication Communication Communication: No difficulties   Cognition Arousal/Alertness: Awake/alert Behavior During Therapy: WFL for tasks assessed/performed Overall Cognitive Status: Within Functional Limits for tasks assessed                                     General Comments  family present and supportive    Exercises     Shoulder Instructions      Home Living Family/patient expects to be discharged to:: Private residence Living Arrangements: Spouse/significant other Available Help at Discharge: Family Type of Home: House Home Access: Stairs to enter Technical brewer of Steps: 1   Home Layout: Able to live on main level with bedroom/bathroom     Bathroom Shower/Tub: Occupational psychologist: Standard     Home Equipment: Bedside commode;Hospital bed          Prior Functioning/Environment Level of Independence: Independent        Comments: works in [employer] Problem List: Decreased strength;Impaired balance (sitting and/or standing);Pain;Decreased safety awareness;Decreased knowledge of precautions;Decreased knowledge of use of DME or AE      OT Treatment/Interventions:      OT Goals(Current goals can be found in the care plan section) Acute Rehab OT Goals Patient Stated Goal: get back to everyday life OT Goal Formulation: With patient  OT Frequency:     Barriers to D/C:            Co-evaluation              AM-PAC PT "6 Clicks" Daily Activity     Outcome Measure Help from another person eating meals?:  None Help from another person taking care of personal grooming?: None Help from another person toileting, which includes using toliet, bedpan, or urinal?: None Help from another person bathing (including washing, rinsing, drying)?: A Little Help from another person to put on and taking off regular upper body clothing?: None Help from another person to put on and taking off regular lower body clothing?: A Little 6 Click Score: 22   End of Session Nurse Communication: Mobility status  Activity Tolerance: Patient tolerated treatment well Patient left: with call bell/phone within reach;with family/visitor present(seated EOB)  OT Visit Diagnosis: Unsteadiness on feet (R26.81);Pain Pain - part of body: (back)                Time: 0347-4259 OT Time Calculation (min): 20 min Charges:  OT General Charges $OT Visit: 1 Visit OT Evaluation $OT Eval Low Complexity: 1 Low  Delight Stare, OT Acute Rehabilitation Services Pager [phone] Office [phone]   Delight Stare 07/13/2018, 8:03 AM

## 2018-07-13 NOTE — Discharge Summary (Signed)
Physician Discharge Summary  Patient ID: Jon Spencer MRN: [id number] DOB/AGE: 06/08/1951 67 y.o.  Admit date: 07/12/2018 Discharge date: 07/13/2018  Admission Diagnoses: Lumbar extra-axial intradural tumor  Discharge Diagnoses: Lumbar extra-axial intradural tumor Active Problems:   Schwannoma   Discharged Condition: good  Hospital Course: She was admitted to undergo surgical extirpation of a large tumor in the spinal canal at the level of L3-L4.  He tolerated surgery well.  Consults: None  Significant Diagnostic Studies: None  Treatments: surgery: Laminectomy L3 and L4 decompression and evacuation of tumor.  Discharge Exam: Blood pressure 140/74, pulse 75, temperature 97.6 F (36.4 C), temperature source Oral, resp. rate 18, height 5' 8.5" (1.74 m), weight 90.6 kg, SpO2 96 %. Incision is clean and dry and motor function appears intact Station and gait are intact.  Disposition: Discharge disposition: 01-Home or Self Care       Discharge Instructions    Call MD for:  redness, tenderness, or signs of infection (pain, swelling, redness, odor or green/yellow discharge around incision site)   Complete by:  As directed    Call MD for:  severe uncontrolled pain   Complete by:  As directed    Call MD for:  temperature >100.4   Complete by:  As directed    Diet - low sodium heart healthy   Complete by:  As directed    Incentive spirometry RT   Complete by:  As directed    Increase activity slowly   Complete by:  As directed      Allergies as of 07/13/2018      Reactions   Tolectin [tolmetin Sodium] Other (See Comments)   Fever 104, breathing problems   Meloxicam Other (See Comments)   REACTION: Flushed, high temp   Univasc [moexipril Hydrochloride] Cough   coughing      Medication List    TAKE these medications   amLODipine 10 MG tablet Commonly known as:  NORVASC Take 1 tablet (10 mg total) by mouth daily.   dexamethasone 1 MG tablet Commonly known as:   DECADRON 2 tablets twice daily for 2 days, one tablet twice daily for 2 days, one tablet daily for 2 days.   gabapentin 300 MG capsule Commonly known as:  NEURONTIN TAKE 1 CAPSULE BY MOUTH TWICE A DAY   HYDROcodone-acetaminophen 5-325 MG tablet Commonly known as:  NORCO/VICODIN Take 1-2 tablets by mouth every 4 (four) hours as needed for moderate pain or severe pain. What changed:    when to take this  reasons to take this   losartan 100 MG tablet Commonly known as:  COZAAR TAKE 1 TABLET BY MOUTH EVERY DAY   methocarbamol 500 MG tablet Commonly known as:  ROBAXIN Take 1 tablet (500 mg total) by mouth every 6 (six) hours as needed for muscle spasms.   MULTIVITAMIN ADULT Chew Chew 1 each by mouth daily.   naproxen sodium 220 MG tablet Commonly known as:  ALEVE Take 220 mg by mouth daily as needed (pain).   predniSONE 10 MG tablet Commonly known as:  DELTASONE TAKE 2 TABLETS (20 MG TOTAL) BY MOUTH DAILY WITH BREAKFAST. What changed:  how much to take   sildenafil 20 MG tablet Commonly known as:  REVATIO TAKE 2-5 TABLETS BY MOUTH ONCE DAILY AS NEEDED FOR SEXUAL ACTIVITY   testosterone cypionate 200 MG/ML injection Commonly known as:  DEPOTESTOSTERONE CYPIONATE Inject 63mL (200mg ) into the muscle every 14 (fourteen) days.      Follow-up Information    D[facility] Pines,  Mallie Mussel, MD Follow up.   Specialty:  Neurosurgery Contact information: [address] [place] [address] [phone]           Signed: Earleen Newport 07/13/2018, 9:39 AM

## 2018-07-13 NOTE — Progress Notes (Signed)
Patient is discharged from room 3C10 at this time. Alert and in stable condition. IV site d/c'd and instructions read to patient and family with understanding verbalized. Left unit via wheelchair with all belongings at side.   

## 2018-07-13 NOTE — Discharge Instructions (Signed)
Wound Care Leave incision open to air. You may shower. Do not scrub directly on incision.  Do not put any creams, lotions, or ointments on incision. Activity Walk each and every day, increasing distance each day. No lifting greater than 5 lbs.  Avoid bending, Lifting and Twisting No driving for 2 weeks; may ride as a passenger locally.  Diet Resume your normal diet.  Return to Work Will be discussed at you follow up appointment. Call Your Doctor If Any of These Occur Redness, drainage, or swelling at the wound.  Temperature greater than 101 degrees. Severe pain not relieved by pain medication. Increased difficulty swallowing. Incision starts to come apart. Follow Up Appt Call today for appointment in 3 weeks [phone]) or for problems.  If you have any hardware placed in your spine, you will need an x-ray before your appointment.

## 2018-08-04 ENCOUNTER — Encounter (HOSPITAL_COMMUNITY): Payer: Self-pay

## 2018-08-04 ENCOUNTER — Ambulatory Visit (HOSPITAL_COMMUNITY)
Admission: RE | Admit: 2018-08-04 | Discharge: 2018-08-04 | Disposition: A | Discharge status: Home / Self Care | Payer: BLUE CROSS/BLUE SHIELD | Source: Ambulatory Visit | Attending: Neurological Surgery | Admitting: Neurological Surgery

## 2018-08-04 ENCOUNTER — Other Ambulatory Visit (HOSPITAL_COMMUNITY): Payer: Self-pay | Admitting: Neurological Surgery

## 2018-08-04 DIAGNOSIS — M79605 Pain in left leg: Secondary | ICD-10-CM | POA: Insufficient documentation

## 2018-08-04 DIAGNOSIS — M7989 Other specified soft tissue disorders: Secondary | ICD-10-CM | POA: Insufficient documentation

## 2018-08-04 NOTE — Progress Notes (Addendum)
Left lower extremity venous duplex has been completed. Negative for DVT. Results were given to Susie at Dr. Clarice Pole office.  08/04/18 2:29 PM Jon Spencer RVT

## 2018-08-08 ENCOUNTER — Telehealth: Payer: Self-pay | Admitting: Family Medicine

## 2018-08-08 NOTE — Telephone Encounter (Signed)
Spoke to the pt.  Informed him that the test was ordered by Dr. Ellene Route.  No results in Epic at this time.  Pt notified to call Dr. Clarice Pole office to see if the results were sent there.  Pt agreed.  No further action required now.

## 2018-08-08 NOTE — Telephone Encounter (Signed)
Copied from [place] [id number]. Topic: Quick Communication - See Telephone Encounter >> Aug 08, 2018  9:42 AM Sheran Luz wrote: CRM for notification. See Telephone encounter for: 08/08/18.  Pt calling to check status of lab results that, he states, were sent to office regarding testing for DVT -stating it is very urgent that he receive a call back. Pt states that he would just like to know why he has not received a call to inform him of results.

## 2018-09-17 ENCOUNTER — Other Ambulatory Visit: Payer: Self-pay | Admitting: Family Medicine

## 2018-09-28 ENCOUNTER — Telehealth: Payer: Self-pay | Admitting: *Deleted

## 2018-09-28 NOTE — Telephone Encounter (Signed)
  09/28/2018-I called the pt as Dr Elease Hashimoto does not recall leaving a message for the pt.  He stated he is trying to get another insurance policy and the agent told him the cost was increased and there was a hold on this as there was a blood disorder listed as a diagnosis on the paperwork from our office and he was not aware of this.  Patient stated he is aware of a liver enzyme problem and should not take Tylenol.  Message sent to Dr Elease Hashimoto. Wendie Simmer     Copied from [place]. Topic: General - Other >> Sep 26, 2018  2:03 PM Judyann Munson wrote: Reason for CRM:  patient is calling to state he received a voicemail from [employer]. Please advise

## 2018-10-01 ENCOUNTER — Other Ambulatory Visit: Payer: Self-pay | Admitting: Family Medicine

## 2018-10-01 NOTE — Telephone Encounter (Signed)
I think what they were referring to was "polycythemia" which he had when on testosterone therapy.  This is a secondary cause of polycythemia sometimes seen in patients of testosterone and reverses when off.  Let him know I will be happy to put together a letter to explain that this is no longer an issue- based on more recent CBC results.

## 2018-10-03 NOTE — Telephone Encounter (Signed)
I spoke to patient in detail.  He had hx of polycythemia while on testosterone replacement.  His levels promptly came down when taken off testosterone and by recent labs 8/19 CBC was normal range.  He states he made threat of contacting lawyer "out of anger" for not getting appt today.  We explained that we had no openings but would do our best to accommodate him.  I will get a letter together for him for insurance letting them know he had secondary polycythemia, now reversed.  We also discussed the fact that those types of threats are not compatible with a good patient/provider level of trust and that if these continued to occur we would have to suggest/facilitate transfer of care to another primary care practice.

## 2018-10-03 NOTE — Telephone Encounter (Signed)
I called the pt and informed him and his wife of the message below.  Patient stated he does want a letter but he looked up this word and stated it means cancer and he wanted to see Dr Elease Hashimoto today.  I advised the pt there are no openings on the schedule for today and offered tomorrow.  Patient stated he cannot come in tomorrow and wanted to let Dr Elease Hashimoto know he will be hearing from his lawyer.  Message sent to Dr Elease Hashimoto.

## 2018-10-04 ENCOUNTER — Telehealth: Payer: Self-pay

## 2018-10-04 ENCOUNTER — Encounter: Payer: Self-pay | Admitting: Family Medicine

## 2018-10-04 NOTE — Telephone Encounter (Signed)
Called patient and let him know that his letter for his insurance is waiting for him to pick up at the front desk. Patient stated that he would pick up this afternoon. Patient verbalized an understanding.

## 2018-10-11 DIAGNOSIS — D497 Neoplasm of unspecified behavior of endocrine glands and other parts of nervous system: Secondary | ICD-10-CM | POA: Diagnosis not present

## 2018-10-11 DIAGNOSIS — I10 Essential (primary) hypertension: Secondary | ICD-10-CM | POA: Diagnosis not present

## 2018-10-11 DIAGNOSIS — Z6831 Body mass index (BMI) 31.0-31.9, adult: Secondary | ICD-10-CM | POA: Diagnosis not present

## 2018-10-18 ENCOUNTER — Other Ambulatory Visit: Payer: Self-pay | Admitting: Family Medicine

## 2018-11-15 ENCOUNTER — Encounter: Payer: Self-pay | Admitting: Family Medicine

## 2018-11-15 ENCOUNTER — Ambulatory Visit (INDEPENDENT_AMBULATORY_CARE_PROVIDER_SITE_OTHER): Payer: BLUE CROSS/BLUE SHIELD | Admitting: Family Medicine

## 2018-11-15 ENCOUNTER — Other Ambulatory Visit: Payer: Self-pay

## 2018-11-15 VITALS — BP 130/78 | HR 85 | Temp 98.2°F | Ht 69.0 in | Wt 203.9 lb

## 2018-11-15 DIAGNOSIS — L989 Disorder of the skin and subcutaneous tissue, unspecified: Secondary | ICD-10-CM

## 2018-11-15 DIAGNOSIS — M79641 Pain in right hand: Secondary | ICD-10-CM | POA: Diagnosis not present

## 2018-11-15 MED ORDER — CEPHALEXIN 500 MG PO CAPS: 500.0000 mg | ORAL_CAPSULE | Freq: Three times a day (TID) | ORAL | 0 refills | Status: DC

## 2018-11-15 NOTE — Progress Notes (Signed)
Subjective:     Patient ID: Jon Spencer, male   DOB: 1950-11-27, 68 y.o.   MRN: [id number]  HPI Patient is seen with some soreness and irritation dorsum right hand.  He states about 3 to 4 years ago he went to dermatologist over in [facility] and had liquid nitrogen therapy.  Lesion never fully went away.  He noted just recently some blister formation along the distal aspect and little bit of surrounding redness and soreness.  No drainage.  Past Medical History:  Diagnosis Date  . ADD 01/17/2010  . Complication of anesthesia    takes awhile to wake up  . History of back surgery 07/2018   Tumor removal  . History of kidney stones    pt. had 2 stones -20 yrs ago  . HYPERLIPIDEMIA 01/18/2009  . HYPERTENSION 01/18/2009  . Impotence of organic origin 10/01/2010  . OSTEOARTHRITIS, HAND 04/17/2010  . TINNITUS 06/26/2009  . TRIGGER FINGER 01/18/2009   Past Surgical History:  Procedure Laterality Date  . APPENDECTOMY  1969  . COLONOSCOPY    . LAMINECTOMY N/A 07/12/2018   Procedure: Lumbar three-four Laminectomy for resection of intradural tumor;  Surgeon: [name], MD;  Location: [facility];  Service: Neurosurgery;  Laterality: N/A;  . [place]    reports that he has never smoked. He has never used smokeless tobacco. He reports current alcohol use of about 1.0 - 4.0 standard drinks of alcohol per week. He reports that he does not use drugs. family history includes Arthritis in an other family member; Hyperlipidemia in an other family member; Hypertension in an other family member; Sudden death in an other family member. Allergies  Allergen Reactions  . Tolectin [Tolmetin Sodium] Other (See Comments)    Fever 104, breathing problems  . Meloxicam Other (See Comments)    REACTION: Flushed, high temp  . Univasc [Moexipril Hydrochloride] Cough    coughing     Review of Systems  Constitutional: Negative for chills and fever.       Objective:   Physical Exam Constitutional:      Appearance: Normal appearance.  Cardiovascular:     Rate and Rhythm: Normal rate and regular rhythm.  Skin:    Comments: Patient has approximately 2 x 3 cm slightly elevated crusted lesion dorsum right hand.  Along the distal margin there is a fairly large vesicle.  We unroofed this and there was clear drainage underneath.  He does have very small surrounding area of erythema.  Neurological:     Mental Status: He is alert.        Assessment:     Patient presents with several year history of elevated thickened hyperkeratotic lesion dorsum right hand.  Recently has noticed some surrounding erythema and blister on the distal aspect.  Denies any recent trauma.  This needs to be ruled out for squamous cell CA    Plan:     -We used number 27-gauge needle and unroofed the vesicle and drained some clear fluid.  No purulent fluid noted. -We explained that he needs to have this excised in my mind to make sure not squamous cell.  He will check with his dermatologist in [facility] and if they do not do this would consider one of the Mohs surgery centers either with [facility] dermatology or skin surgery [facility].  He will get back with me if his dermatologist is unable to see him soon -May have some very early cellulitis changes surrounding the area and  we cultured the fluid that was drained for the blister and will start Keflex 500 mg 3 times daily for 7 days  Eulas Post MD [facility]

## 2018-11-15 NOTE — Patient Instructions (Signed)
Follow up for any progressive redness or swelling  I feel that you need to follow up with dermatologist to get more definitive biopsy of right hand lesion- rule our squamous cell cancer  If can't get in to see dermatologist in next few weeks let me know.

## 2018-11-18 ENCOUNTER — Encounter: Payer: Self-pay | Admitting: Family Medicine

## 2018-11-18 LAB — WOUND CULTURE
MICRO NUMBER:: 53052
RESULT:: NO GROWTH
SPECIMEN QUALITY:: ADEQUATE

## 2018-11-21 DIAGNOSIS — L570 Actinic keratosis: Secondary | ICD-10-CM | POA: Diagnosis not present

## 2018-12-06 DIAGNOSIS — M62830 Muscle spasm of back: Secondary | ICD-10-CM | POA: Diagnosis not present

## 2018-12-06 DIAGNOSIS — R531 Weakness: Secondary | ICD-10-CM | POA: Diagnosis not present

## 2018-12-06 DIAGNOSIS — D497 Neoplasm of unspecified behavior of endocrine glands and other parts of nervous system: Secondary | ICD-10-CM | POA: Diagnosis not present

## 2018-12-12 ENCOUNTER — Other Ambulatory Visit: Payer: Self-pay | Admitting: Family Medicine

## 2018-12-14 DIAGNOSIS — I10 Essential (primary) hypertension: Secondary | ICD-10-CM | POA: Diagnosis not present

## 2018-12-14 DIAGNOSIS — D497 Neoplasm of unspecified behavior of endocrine glands and other parts of nervous system: Secondary | ICD-10-CM | POA: Diagnosis not present

## 2018-12-29 ENCOUNTER — Ambulatory Visit: Payer: Self-pay

## 2018-12-29 NOTE — Telephone Encounter (Signed)
    [place]. [name], 68 y.o., [birth year] MRN:  [id number] Phone:  [phone] Jon Spencer) PCP:  Jon Post, MD Primary Cvg:  BLUE CROSS BLUE SHIELD/BCBS OTHER Message from [facility] sent at 12/29/2018 1:36 PM EST   Pt stated he is experiencing pain in his joints and he would like to know if he can take the supplement Coq10. Pt requests call back. Cb# [phone]  Call History    Type Contact Phone User  12/29/2018 01:34 PM Phone (Incoming) Jon Spencer, Jon Spencer (Self) [phone] (M) [name], [name]  Pt. Reports he has arthritic pain in his left knee and left hand. Wants to know if he can take CoQ10. Please advise.  Answer Assessment - Initial Assessment Questions 1. LOCATION and RADIATION: "Where is the pain located?"      Left knee pain 2. QUALITY: "What does the pain feel like?"  (e.g., sharp, dull, aching, burning)     Aching 3. SEVERITY: "How bad is the pain?" "What does it keep you from doing?"   (Scale 1-10; or mild, moderate, severe)   -  MILD (1-3): doesn't interfere with normal activities    -  MODERATE (4-7): interferes with normal activities (e.g., work or school) or awakens from sleep, limping    -  SEVERE (8-10): excruciating pain, unable to do any normal activities, unable to walk     Moderate 4. ONSET: "When did the pain start?" "Does it come and go, or is it there all the time?"     It's been hurting awhile 5. RECURRENT: "Have you had this pain before?" If so, ask: "When, and what happened then?"     Yes 6. SETTING: "Has there been any recent work, exercise or other activity that involved that part of the body?"      No 7. AGGRAVATING FACTORS: "What makes the knee pain worse?" (e.g., walking, climbing stairs, running)     Climbing stairs 8. ASSOCIATED SYMPTOMS: "Is there any swelling or redness of the knee?"     No 9. OTHER SYMPTOMS: "Do you have any other symptoms?" (e.g., chest pain, difficulty breathing, fever, calf pain)     Pain in left  hand 10. PREGNANCY: "Is there any chance you are pregnant?" "When was your last menstrual period?"       n/a  Protocols used: KNEE PAIN-A-AH

## 2018-12-30 NOTE — Telephone Encounter (Signed)
I do not see any reason he cannot take the supplement referenced.

## 2019-01-02 NOTE — Telephone Encounter (Signed)
Pt aware. Nothing further needed 

## 2019-01-09 ENCOUNTER — Other Ambulatory Visit: Payer: Self-pay | Admitting: Family Medicine

## 2019-02-02 ENCOUNTER — Telehealth: Payer: Self-pay

## 2019-02-02 NOTE — Telephone Encounter (Signed)
Copied from [place] [id number]. Topic: General - Other >> Feb 02, 2019 10:45 AM Jon Spencer wrote: Reason for CRM: Pt stated he has suspended taking the testosterone injection and now he is experiencing lots of energy loss. Pt would like to know what he should do. Pt requests a call back. Cb# [phone]

## 2019-02-02 NOTE — Telephone Encounter (Signed)
Please see message. °

## 2019-02-03 ENCOUNTER — Other Ambulatory Visit: Payer: Self-pay

## 2019-02-03 ENCOUNTER — Ambulatory Visit (INDEPENDENT_AMBULATORY_CARE_PROVIDER_SITE_OTHER): Payer: BLUE CROSS/BLUE SHIELD | Admitting: Family Medicine

## 2019-02-03 DIAGNOSIS — E291 Testicular hypofunction: Secondary | ICD-10-CM

## 2019-02-03 MED ORDER — TESTOSTERONE 20.25 MG/ACT (1.62%) TD GEL: TRANSDERMAL | 2 refills | Status: DC

## 2019-02-03 NOTE — Progress Notes (Signed)
Patient ID: Jon Spencer, male   DOB: 1950/11/12, 68 y.o.   MRN: [id number]  Virtual Visit via Video Note  I connected with [name] on 02/03/19 at  2:45 PM EDT by a video enabled telemedicine application and verified that I am speaking with the correct person using two identifiers.  Location patient: home Location provider:work or home office Persons participating in the virtual visit: patient, provider  I discussed the limitations of evaluation and management by telemedicine and the availability of in person appointments. The patient expressed understanding and agreed to proceed.   HPI: Patient has history of low testosterone.  He has been off replacement now for several months.  He has had increased fatigue issues.  He would like to go back on replacement but is trying to avoid injection therapy because of current Covid-19 issues.  He previously took testosterone 200 mg intramuscular every 14 days.  He would like to explore topical options.   ROS: See pertinent positives and negatives per HPI.  Past Medical History:  Diagnosis Date  . ADD 01/17/2010  . Complication of anesthesia    takes awhile to wake up  . History of back surgery 07/2018   Tumor removal  . History of kidney stones    pt. had 2 stones -20 yrs ago  . HYPERLIPIDEMIA 01/18/2009  . HYPERTENSION 01/18/2009  . Impotence of organic origin 10/01/2010  . OSTEOARTHRITIS, HAND 04/17/2010  . TINNITUS 06/26/2009  . TRIGGER FINGER 01/18/2009    Past Surgical History:  Procedure Laterality Date  . APPENDECTOMY  1969  . COLONOSCOPY    . LAMINECTOMY N/A 07/12/2018   Procedure: Lumbar three-four Laminectomy for resection of intradural tumor;  Surgeon: [name], MD;  Location: [facility];  Service: Neurosurgery;  Laterality: N/A;  . VASECTOMY  1992    Family History  Problem Relation Age of Onset  . Arthritis Other   . Hyperlipidemia Other   . Hypertension Other   . Sudden death Other     SOCIAL HX:  Non-smoker   Current Outpatient Medications:  .  amLODipine (NORVASC) 10 MG tablet, TAKE 1 TABLET BY MOUTH EVERY DAY, Disp: 90 tablet, Rfl: 1 .  cephALEXin (KEFLEX) 500 MG capsule, Take 1 capsule (500 mg total) by mouth 3 (three) times daily., Disp: 21 capsule, Rfl: 0 .  losartan (COZAAR) 100 MG tablet, TAKE 1 TABLET BY MOUTH EVERY DAY SCHEDULE OFFICE VISIT FOR MORE REFILLS, Disp: 30 tablet, Rfl: 0 .  Multiple Vitamins-Minerals (MULTIVITAMIN ADULT) CHEW, Chew 1 each by mouth daily., Disp: , Rfl:  .  naproxen sodium (ALEVE) 220 MG tablet, Take 220 mg by mouth daily as needed (pain)., Disp: , Rfl:  .  sildenafil (REVATIO) 20 MG tablet, TAKE 2-5 TABLETS BY MOUTH ONCE DAILY AS NEEDED FOR SEXUAL ACTIVITY, Disp: 100 tablet, Rfl: 1 .  Testosterone (ANDROGEL PUMP) 20.25 MG/ACT (1.62%) GEL, One pump spray to each arm once daily., Disp: 75 g, Rfl: 2  EXAM:  VITALS per patient if applicable:  GENERAL: alert, oriented, appears well and in no acute distress  HEENT: atraumatic, conjunttiva clear, no obvious abnormalities on inspection of external nose and ears  NECK: normal movements of the head and neck  LUNGS: on inspection no signs of respiratory distress, breathing rate appears normal, no obvious gross SOB, gasping or wheezing  CV: no obvious cyanosis  MS: moves all visible extremities without noticeable abnormality  PSYCH/NEURO: pleasant and cooperative, no obvious depression or anxiety, speech and thought processing grossly intact  ASSESSMENT AND PLAN:  Discussed the following assessment and plan:  Low testosterone.  Patient currently off therapy and having increased fatigue issues  -We discussed trial of AndroGel pump spray 1.62% 1 pump spray per arm once daily -We will need CBC, follow-up testosterone level, and PSA within a couple of months     I discussed the assessment and treatment plan with the patient. The patient was provided an opportunity to ask questions and all were  answered. The patient agreed with the plan and demonstrated an understanding of the instructions.   The patient was advised to call back or seek an in-person evaluation if the symptoms worsen or if the condition fails to improve as anticipated.  [name], MD

## 2019-02-03 NOTE — Telephone Encounter (Signed)
Called patient and he does not want to do testosterone injections anymore and he wants gel. I have set up a WebEx for documentation for gel or other alternative.

## 2019-02-03 NOTE — Telephone Encounter (Signed)
Sounds good

## 2019-02-03 NOTE — Telephone Encounter (Signed)
I see only 3 options:  Don't take it, continue to get here (least desirable) b/o Covid-19, or give at home- if they can.

## 2019-02-14 ENCOUNTER — Other Ambulatory Visit: Payer: Self-pay | Admitting: Family Medicine

## 2019-03-08 ENCOUNTER — Other Ambulatory Visit: Payer: Self-pay | Admitting: Family Medicine

## 2019-03-12 IMAGING — DX DG LUMBAR SPINE COMPLETE 4+V
5 series · 5 of 5 positions shown · non-contrast
Comparison: None.

CLINICAL DATA: Progressive lower sacral and coccygeal pain. No
known injury.

EXAM:
LUMBAR SPINE - COMPLETE 4+ VIEW

[l-spine ap]
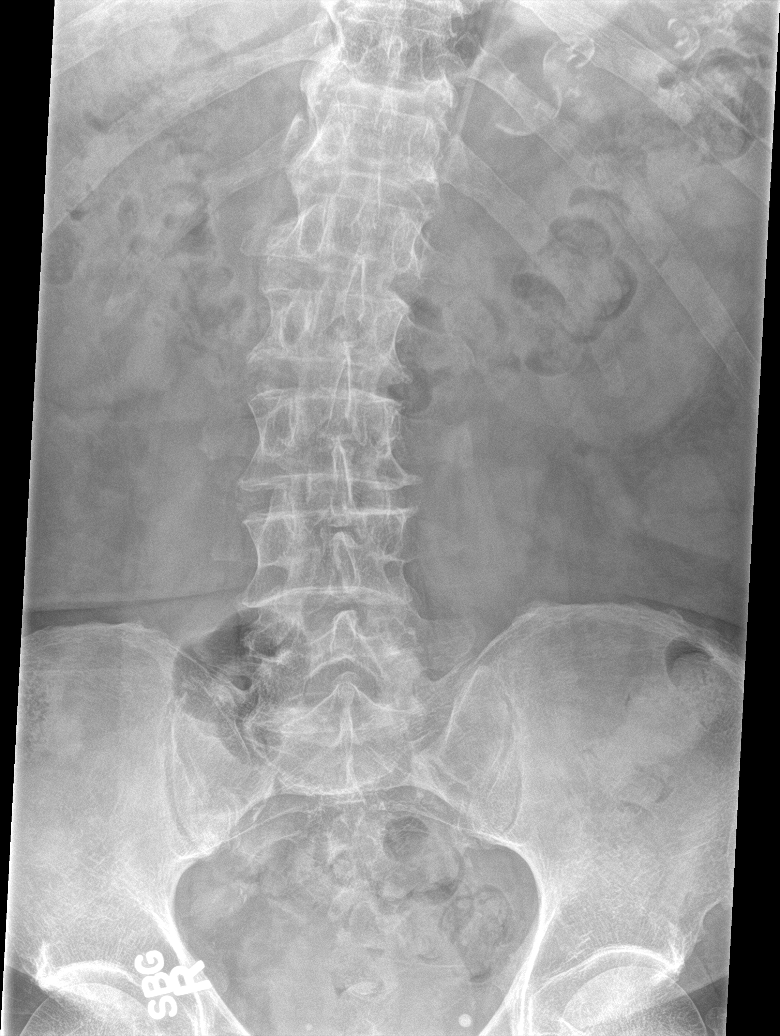

[l-spine obl (1 of 2)]
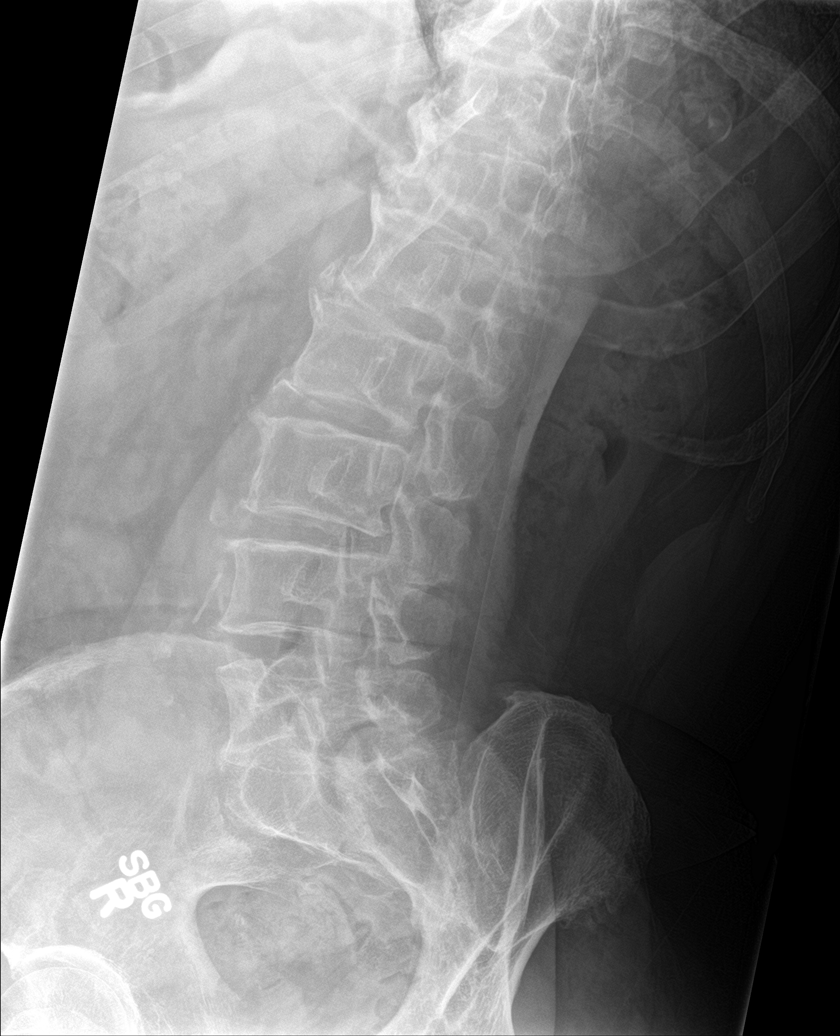

[l-spine obl (2 of 2)]
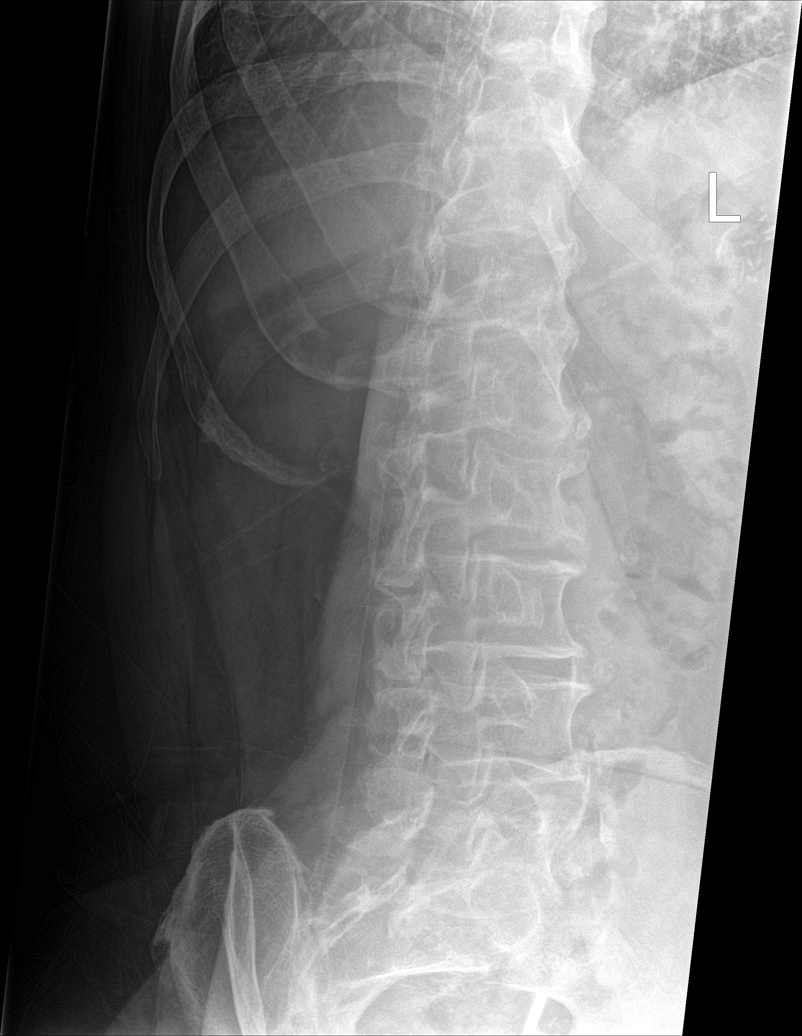

[l-spine lat]
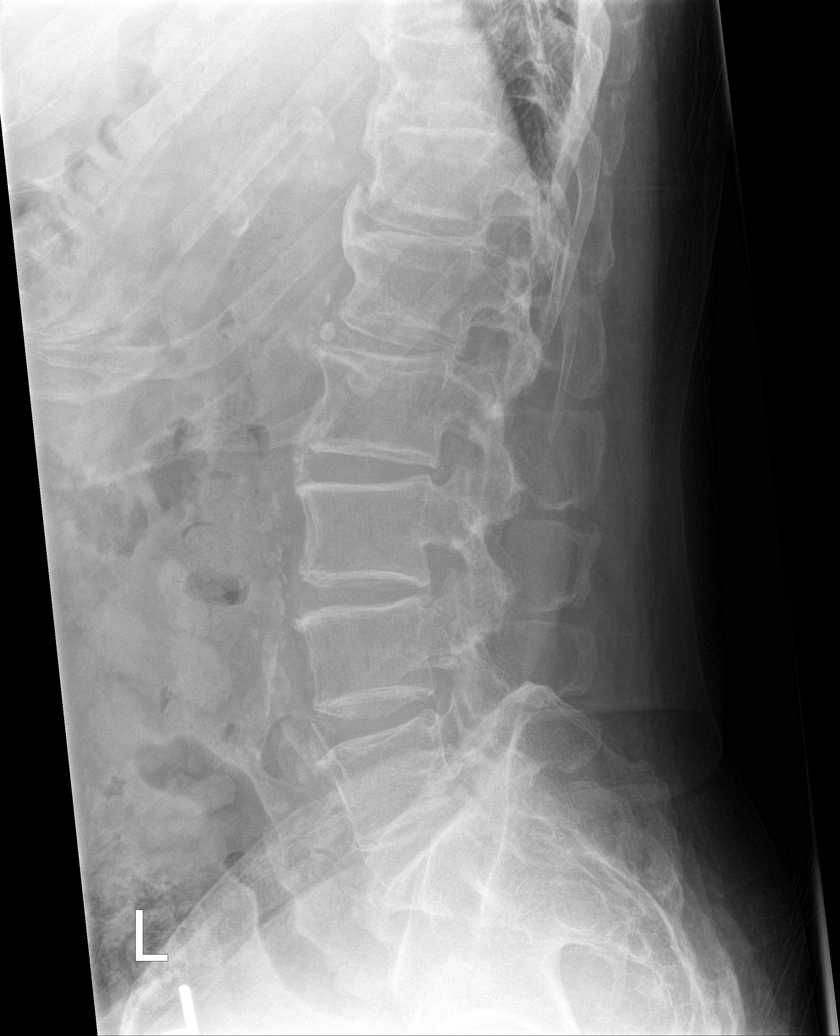

[l-spine spot]
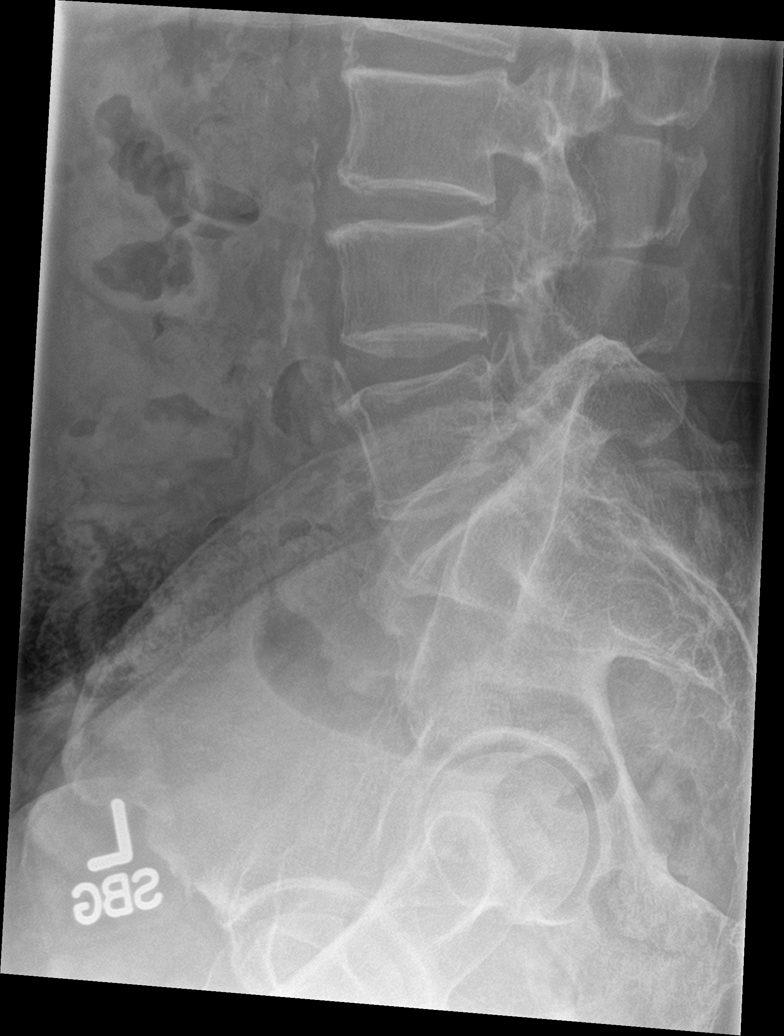

[5 of 5 positions shown; findings below may reference images not displayed]

FINDINGS: Mild rightward scoliosis. Degenerative spurring throughout the
lumbar spine. Mild disc space narrowing in the upper and mid lumbar
spine. Mild degenerative facet disease diffusely. No fracture or
subluxation. SI joints are symmetric and unremarkable.
IMPRESSION: Mild generalized rightward scoliosis. Degenerative disc and facet
disease. No acute bony abnormality.

## 2019-03-29 ENCOUNTER — Ambulatory Visit (INDEPENDENT_AMBULATORY_CARE_PROVIDER_SITE_OTHER): Payer: BLUE CROSS/BLUE SHIELD | Admitting: Family Medicine

## 2019-03-29 ENCOUNTER — Other Ambulatory Visit: Payer: Self-pay

## 2019-03-29 ENCOUNTER — Ambulatory Visit: Payer: Self-pay

## 2019-03-29 DIAGNOSIS — E291 Testicular hypofunction: Secondary | ICD-10-CM

## 2019-03-29 NOTE — Telephone Encounter (Signed)
Pt called stating that he has been using topical Androgel pump for about 1 month.  He has developed mood swings and anxiety .He states that Monday he had symptoms so severe that he just stopped the medication. He felt ok Tuesday but today he feels extreme lack of energy. Per protocol call was transferred to office for appointment scheduling  Answer Assessment - Initial Assessment Questions 1. SYMPTOMS: "Do you have any symptoms?"     Mood swings anxiety . Monday night anxiety attack sensation 2. SEVERITY: If symptoms are present, ask "Are they mild, moderate or severe?"     Severe after 2 days off the medication he has noticable decrease in energy  Protocols used: MEDICATION QUESTION CALL-A-AH

## 2019-03-29 NOTE — Progress Notes (Signed)
Patient ID: Jon Spencer, male   DOB: 12-26-1950, 68 y.o.   MRN: [id number]  This visit type was conducted due to national recommendations for restrictions regarding the COVID-19 pandemic in an effort to limit this patient's exposure and mitigate transmission in our community.   Virtual Visit via Telephone Note  I connected with Jon Spencer on 03/29/19 at 10:00 AM EDT by telephone and verified that I am speaking with the correct person using two identifiers.   I discussed the limitations, risks, security and privacy concerns of performing an evaluation and management service by telephone and the availability of in person appointments. I also discussed with the patient that there may be a patient responsible charge related to this service. The patient expressed understanding and agreed to proceed.  Location patient: home Location provider: work or home office Participants present for the call: patient, provider Patient did not have a visit in the prior 7 days to address this/these issue(s).   History of Present Illness:  Patient recently started back topical testosterone replacement with AndroGel pump 1.62% 1 pump spray on each arm once daily.  He previously took intramuscular replacement but had requested topical.  He states that last week he had a couple days where he had "bad mood ".  He also felt more hyped up and had one episode that he felt was almost like a panic attack.  He feels this is coming from the testosterone.  He has not yet had testosterone levels since starting topical.  He decided to leave off topical testosterone past couple days and symptoms have improved.  He does not recall similar symptoms with intramuscular replacement previously   Observations/Objective: Patient sounds cheerful and well on the phone. I do not appreciate any SOB. Speech and thought processing are grossly intact. Patient reported vitals:  Assessment and Plan:  Low testosterone.  Possible side  effects from medication.  We recommended the following  -Patient needs follow-up labs including testosterone level, CBC, PSA.  Future lab orders placed for June 10.  This should help [place] to regulate his dosage.  If levels are high we will back down his dosage  Follow Up Instructions:  -June 10 as above   [id number] 5-10 [id number] 11-20 [id number] 21-30 I did not refer this patient for an OV in the next 24 hours for this/these issue(s).  I discussed the assessment and treatment plan with the patient. The patient was provided an opportunity to ask questions and all were answered. The patient agreed with the plan and demonstrated an understanding of the instructions.   The patient was advised to call back or seek an in-person evaluation if the symptoms worsen or if the condition fails to improve as anticipated.  I provided 15  minutes of non-face-to-face time during this encounter.   [name], MD

## 2019-03-29 NOTE — Telephone Encounter (Signed)
Appt scheduled for today.

## 2019-04-10 ENCOUNTER — Other Ambulatory Visit: Payer: Self-pay | Admitting: Family Medicine

## 2019-04-12 ENCOUNTER — Other Ambulatory Visit: Payer: Self-pay | Admitting: Family Medicine

## 2019-04-12 ENCOUNTER — Other Ambulatory Visit (INDEPENDENT_AMBULATORY_CARE_PROVIDER_SITE_OTHER): Payer: BLUE CROSS/BLUE SHIELD

## 2019-04-12 ENCOUNTER — Other Ambulatory Visit: Payer: Self-pay

## 2019-04-12 DIAGNOSIS — E291 Testicular hypofunction: Secondary | ICD-10-CM | POA: Diagnosis not present

## 2019-04-12 LAB — CBC WITH DIFFERENTIAL/PLATELET
Basophils Absolute: 0.1 10*3/uL (ref 0.0–0.1)
Basophils Relative: 0.8 % (ref 0.0–3.0)
Eosinophils Absolute: 0.3 10*3/uL (ref 0.0–0.7)
Eosinophils Relative: 3.1 % (ref 0.0–5.0)
HCT: 46.2 % (ref 39.0–52.0)
Hemoglobin: 15.7 g/dL (ref 13.0–17.0)
Lymphocytes Relative: 32.3 % (ref 12.0–46.0)
Lymphs Abs: 2.6 10*3/uL (ref 0.7–4.0)
MCHC: 33.9 g/dL (ref 30.0–36.0)
MCV: 98.1 fl (ref 78.0–100.0)
Monocytes Absolute: 0.6 10*3/uL (ref 0.1–1.0)
Monocytes Relative: 7 % (ref 3.0–12.0)
Neutro Abs: 4.6 10*3/uL (ref 1.4–7.7)
Neutrophils Relative %: 56.8 % (ref 43.0–77.0)
Platelets: 241 10*3/uL (ref 150.0–400.0)
RBC: 4.71 Mil/uL (ref 4.22–5.81)
RDW: 13.4 % (ref 11.5–15.5)
WBC: 8.2 10*3/uL (ref 4.0–10.5)

## 2019-04-12 LAB — TESTOSTERONE: Testosterone: 194.77 ng/dL — ABNORMAL LOW (ref 300.00–890.00)

## 2019-04-12 LAB — PSA: PSA: 1.86 ng/mL (ref 0.10–4.00)

## 2019-05-13 ENCOUNTER — Other Ambulatory Visit: Payer: Self-pay | Admitting: Family Medicine

## 2019-06-11 ENCOUNTER — Other Ambulatory Visit: Payer: Self-pay | Admitting: Family Medicine

## 2019-07-13 ENCOUNTER — Other Ambulatory Visit: Payer: Self-pay | Admitting: Family Medicine

## 2019-10-17 ENCOUNTER — Other Ambulatory Visit: Payer: Self-pay | Admitting: Family Medicine

## 2019-10-22 ENCOUNTER — Other Ambulatory Visit: Payer: Self-pay | Admitting: Family Medicine

## 2019-11-01 ENCOUNTER — Telehealth: Payer: Self-pay

## 2019-11-01 NOTE — Telephone Encounter (Signed)
Prior authorization paperwork has been faxed to [place] from [facility] and has been completed by Dr. Elease Hashimoto and faxed to [phone] with an email confirmation received.

## 2019-11-08 ENCOUNTER — Other Ambulatory Visit: Payer: Self-pay

## 2019-11-08 ENCOUNTER — Encounter: Payer: Self-pay | Admitting: Family Medicine

## 2019-11-08 ENCOUNTER — Telehealth (INDEPENDENT_AMBULATORY_CARE_PROVIDER_SITE_OTHER): Payer: Self-pay | Admitting: Family Medicine

## 2019-11-08 DIAGNOSIS — L299 Pruritus, unspecified: Secondary | ICD-10-CM

## 2019-11-08 DIAGNOSIS — N529 Male erectile dysfunction, unspecified: Secondary | ICD-10-CM

## 2019-11-08 MED ORDER — TRIAMCINOLONE ACETONIDE 0.1 % EX CREA: 1.0000 "application " | TOPICAL_CREAM | Freq: Two times a day (BID) | CUTANEOUS | 2 refills | Status: DC | PRN

## 2019-11-08 MED ORDER — VIAGRA 100 MG PO TABS: 100.0000 mg | ORAL_TABLET | Freq: Every day | ORAL | 11 refills | Status: DC | PRN

## 2019-11-08 NOTE — Progress Notes (Signed)
This visit type was conducted due to national recommendations for restrictions regarding the COVID-19 pandemic in an effort to limit this patient's exposure and mitigate transmission in our community.   Virtual Visit via Telephone Note  I connected with [name] on 11/08/19 at  5:00 PM EST by telephone and verified that I am speaking with the correct person using two identifiers.   I discussed the limitations, risks, security and privacy concerns of performing an evaluation and management service by telephone and the availability of in person appointments. I also discussed with the patient that there may be a patient responsible charge related to this service. The patient expressed understanding and agreed to proceed.  Location patient: home Location provider: work or home office Participants present for the call: patient, provider Patient did not have a visit in the prior 7 days to address this/these issue(s).   History of Present Illness: [place] call for the following items  He states for the past 2 to 3 weeks he has had intermittent severe localized itching alternating between the right and left legs.  He tends to have itching from the knee down toward the ankle.  This seems to be worse in some spots than others.  He does not see any actually associated rash.  Nonscaly.  No follicles.  No pustules.  No vesicles.  No change of soap or detergent.  He tried over-the-counter hydrocortisone cream with some relief.  Second issue is he has erectile dysfunction.  He does have hypogonadism and is on replacement.  He is recently taken Revatio up to 100 mg without much effect.  He is specifically requesting tradename only Viagra.  Non-smoker.  No known peripheral vascular disease  Past Medical History:  Diagnosis Date  . ADD 01/17/2010  . Complication of anesthesia    takes awhile to wake up  . History of back surgery 07/2018   Tumor removal  . History of kidney stones    pt. had 2 stones -20  yrs ago  . HYPERLIPIDEMIA 01/18/2009  . HYPERTENSION 01/18/2009  . Impotence of organic origin 10/01/2010  . OSTEOARTHRITIS, HAND 04/17/2010  . TINNITUS 06/26/2009  . TRIGGER FINGER 01/18/2009   Past Surgical History:  Procedure Laterality Date  . APPENDECTOMY  1969  . COLONOSCOPY    . LAMINECTOMY N/A 07/12/2018   Procedure: Lumbar three-four Laminectomy for resection of intradural tumor;  Surgeon: [name], MD;  Location: [place];  Service: Neurosurgery;  Laterality: N/A;  . [place]    reports that he has never smoked. He has never used smokeless tobacco. He reports current alcohol use of about 1.0 - 4.0 standard drinks of alcohol per week. He reports that he does not use drugs. family history includes Arthritis in an other family member; Hyperlipidemia in an other family member; Hypertension in an other family member; Sudden death in an other family member. Allergies  Allergen Reactions  . Tolectin [Tolmetin Sodium] Other (See Comments)    Fever 104, breathing problems  . Meloxicam Other (See Comments)    REACTION: Flushed, high temp  . Univasc [Moexipril Hydrochloride] Cough    coughing      Observations/Objective: Patient sounds cheerful and well on the phone. I do not appreciate any SOB. Speech and thought processing are grossly intact. Patient reported vitals:  Assessment and Plan:  #1 intermittent pruritus involving the legs.  No visible rash.  Question neuropathic  = Given the fact that he has gotten some relief with over-the-counter hydrocortisone we did  agree to triamcinolone 0.1% cream twice daily as needed -If symptoms persist consider follow-up to further evaluate  #2 erectile dysfunction -Patient requested tradename only Viagra and we wrote for Viagra 100 mg 1 daily as needed.  He is aware this will likely be much more expensive than generic  Follow Up Instructions:    99441 5-10 99442 11-20 99443 21-30 I did not refer this patient for an OV in  the next 24 hours for this/these issue(s).  I discussed the assessment and treatment plan with the patient. The patient was provided an opportunity to ask questions and all were answered. The patient agreed with the plan and demonstrated an understanding of the instructions.   The patient was advised to call back or seek an in-person evaluation if the symptoms worsen or if the condition fails to improve as anticipated.  I provided 17 minutes of non-face-to-face time during this encounter.   [name], MD

## 2019-11-10 ENCOUNTER — Other Ambulatory Visit: Payer: Self-pay | Admitting: Family Medicine

## 2019-11-17 ENCOUNTER — Telehealth: Payer: Self-pay

## 2019-11-17 NOTE — Telephone Encounter (Signed)
Received approval for prior authorization for Testosterone 20.25 mg gel Reference number: [id number] Effective dates: 11/01/2019--11/01/2038

## 2019-11-18 ENCOUNTER — Encounter: Payer: Self-pay | Admitting: Family Medicine

## 2020-01-10 ENCOUNTER — Other Ambulatory Visit: Payer: Self-pay

## 2020-01-10 ENCOUNTER — Telehealth: Payer: Self-pay | Admitting: Family Medicine

## 2020-01-10 ENCOUNTER — Telehealth (INDEPENDENT_AMBULATORY_CARE_PROVIDER_SITE_OTHER): Payer: Self-pay | Admitting: Family Medicine

## 2020-01-10 DIAGNOSIS — M542 Cervicalgia: Secondary | ICD-10-CM
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
## 2020-01-10 NOTE — Telephone Encounter (Signed)
Pt is having a pain in his back and neck and would like a call back as to what he should do. Offered appt he could not come until later next week.

## 2020-01-10 NOTE — Telephone Encounter (Signed)
Correction: Right lower back side of neck

## 2020-01-10 NOTE — Telephone Encounter (Signed)
Called patient and he stated that he has a pain in his lower left back side of neck and has been going on for several months. Pain is getting worse. 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 I have scheduled patient for a Doxy visit at 5:45pm today. Sending as Jon Spencer

## 2020-01-16 ENCOUNTER — Other Ambulatory Visit: Payer: Self-pay

## 2020-01-17 ENCOUNTER — Other Ambulatory Visit: Payer: Self-pay

## 2020-01-17 ENCOUNTER — Ambulatory Visit (INDEPENDENT_AMBULATORY_CARE_PROVIDER_SITE_OTHER): Payer: BC Managed Care – PPO

## 2020-01-17 DIAGNOSIS — M542 Cervicalgia: Secondary | ICD-10-CM | POA: Diagnosis not present

## 2020-04-16 ENCOUNTER — Telehealth: Payer: Self-pay | Admitting: Family Medicine

## 2020-04-16 NOTE — Telephone Encounter (Signed)
The patient called to make an appointment to be seen for tendonitis in his right wrist. Burchette has seen him for this before and it has came back. Dr. Elease Hashimoto doesn't have any available appointments till Monday and he doesn't want to see anyone else. The patient asked if Burchette can call him back to let him know what he suggests that he do for his wrist.  Please advise

## 2020-04-16 NOTE — Telephone Encounter (Signed)
Called pt and scheduled pt with Dr.Fry to get wrist examined

## 2020-04-17 ENCOUNTER — Ambulatory Visit (INDEPENDENT_AMBULATORY_CARE_PROVIDER_SITE_OTHER): Payer: BC Managed Care – PPO | Admitting: Family Medicine

## 2020-04-17 ENCOUNTER — Encounter: Payer: Self-pay | Admitting: Family Medicine

## 2020-04-17 ENCOUNTER — Other Ambulatory Visit: Payer: Self-pay

## 2020-04-17 VITALS — BP 122/60 | HR 60 | Temp 97.7°F | Wt 196.2 lb

## 2020-04-17 DIAGNOSIS — M654 Radial styloid tenosynovitis [de Quervain]: Secondary | ICD-10-CM

## 2020-04-17 MED ORDER — METHYLPREDNISOLONE 4 MG PO TBPK: ORAL_TABLET | ORAL | 0 refills | Status: DC

## 2020-04-17 NOTE — Progress Notes (Signed)
   Subjective:    Patient ID: Jon Spencer, male    DOB: Jan 23, 1951, 69 y.o.   MRN: [id number]  HPI Here for 2 weeks of pain in the right wrist. No recent trauma, no swelling. He takes Aleve BID and applies heat. He has had steroid injections to the hand and wrist in the past. He has some relief by wearing a wrist splint.    Review of Systems  Constitutional: Negative.   Respiratory: Negative.   Cardiovascular: Negative.   Musculoskeletal: Positive for arthralgias.       Objective:   Physical Exam Constitutional:      Appearance: Normal appearance.  Cardiovascular:     Rate and Rhythm: Normal rate and regular rhythm.     Pulses: Normal pulses.     Heart sounds: Normal heart sounds.  Pulmonary:     Effort: Pulmonary effort is normal.     Breath sounds: Normal breath sounds.  Musculoskeletal:     Comments: He is tender along the radial side of the right wrist and over the first [facility] joint. No erythema or swelling or warmth. Ulnar flexion of the wrist is painful   Neurological:     Mental Status: He is alert.           Assessment & Plan:  Alfonse Ras tenosynovitis. I suggested he apply ice several times a day rather than heat. Try a Medrol dose pack. Alysia Penna, MD

## 2020-04-20 ENCOUNTER — Other Ambulatory Visit: Payer: Self-pay | Admitting: Family Medicine

## 2020-04-24 ENCOUNTER — Telehealth: Payer: Self-pay | Admitting: Family Medicine

## 2020-04-24 DIAGNOSIS — M654 Radial styloid tenosynovitis [de Quervain]: Secondary | ICD-10-CM

## 2020-04-24 NOTE — Telephone Encounter (Signed)
Pt saw Dr. Sarajane Jews for his R wrist pain and was prescribed medication (methylprednisolone). Pt stated that it is not working and he wants to know what his PCP wants to do whether that is a follow up with him or to take more of the same medication or a different one?   Offered a visit with PCP-pt declined.  Pt can be reached at [phone]

## 2020-04-24 NOTE — Telephone Encounter (Signed)
Please advise 

## 2020-04-24 NOTE — Telephone Encounter (Signed)
Pt states that he would like referral to sports medicine referral placed as requested

## 2020-04-24 NOTE — Telephone Encounter (Signed)
Was diagnosed with de Quervain's tenosynovitis.  If already tried prednisone, icing, nonsteroidal, and bracing without relief next step would be steroid injection.  We can either offer evaluation here to see if he is a candidate for steroid injection or if he prefers can set up with sports medicine

## 2020-04-25 ENCOUNTER — Other Ambulatory Visit: Payer: Self-pay

## 2020-04-25 ENCOUNTER — Ambulatory Visit (INDEPENDENT_AMBULATORY_CARE_PROVIDER_SITE_OTHER): Payer: BC Managed Care – PPO | Admitting: Family Medicine

## 2020-04-25 ENCOUNTER — Encounter: Payer: Self-pay | Admitting: Family Medicine

## 2020-04-25 ENCOUNTER — Ambulatory Visit: Payer: Self-pay

## 2020-04-25 VITALS — BP 124/70 | HR 61 | Ht 69.0 in | Wt 195.4 lb

## 2020-04-25 DIAGNOSIS — M25531 Pain in right wrist: Secondary | ICD-10-CM

## 2020-04-25 DIAGNOSIS — M654 Radial styloid tenosynovitis [de Quervain]: Secondary | ICD-10-CM | POA: Diagnosis not present

## 2020-04-25 NOTE — Patient Instructions (Signed)
Thank you for coming in today. Call or go to the ER if you develop a large red swollen joint with extreme pain or oozing puss.  Use the wrist brace.  Recheck as needed

## 2020-04-25 NOTE — Progress Notes (Signed)
Subjective:    CC: R radial wrist pain  I, Molly Weber, LAT, ATC, am serving as scribe for Dr. Lynne Leader.  HPI: Pt is a 69 y/o male presenting w/ c/o R radial-sided wrist pain x 3 weeks.  He has a hx of prior R wrist pain and has had injections in the past.  Pain located at radial styloid.  Radiating pain: yes into the forearm and arm R wrist swelling: No R wrist mechanical symptoms: No Aggravating factors: any R wrist or thumb AROM Treatments tried: Aleve, heat, wrist splint; Medrol dose pack prescribed by Dr. Sarajane Jews; Voltaren gel  Pertinent review of Systems: No fevers or chills  Relevant historical information: Hypertension.  Of note his wife was recently diagnosed with colon cancer.   Objective:    Vitals:   04/25/20 1123  BP: 124/70  Pulse: 61  SpO2: 98%   General: Well Developed, well nourished, and in no acute distress.   MSK: Right wrist normal-appearing no swelling overlying radial styloid. Tender palpation at radial styloid. Normal hand motion pain with ulnar deviation. Intact strength.   Positive Finkelstein's test.  Lab and Radiology Results  Diagnostic Limited MSK Ultrasound of: Right wrist first dorsal wrist compartment Intact extensor tendons.  Hypoechoic fluid tracking and tendon sheath consistent with de Quervain's tenosynovitis Impression: De Quervain's tenosynovitis  Procedure: Real-time Ultrasound Guided Injection of right first dorsal wrist compartment (de Quervain's) Device: Philips Affiniti 50G Images permanently stored and available for review in the ultrasound unit. Verbal informed consent obtained.  Discussed risks and benefits of procedure. Warned about infection bleeding damage to structures skin hypopigmentation and fat atrophy among others. Patient expresses understanding and agreement Time-out conducted.   Noted no overlying erythema, induration, or other signs of local infection.   Skin prepped in a sterile fashion.   Local  anesthesia: Topical Ethyl chloride.   With sterile technique and under real time ultrasound guidance:  40 mg of Kenalog and 1 mL of lidocaine injected easily.   Completed without difficulty   Pain immediately resolved suggesting accurate placement of the medication.   Advised to call if fevers/chills, erythema, induration, drainage, or persistent bleeding.   Images permanently stored and available for review in the ultrasound unit.  Impression: Technically successful ultrasound guided injection.       Impression and Recommendations:    Assessment and Plan: 69 y.o. male with right wrist pain consistent with de Quervain's tenosynovitis.  Failing initial conservative management.  Plan for injection and continued wrist bracing.  Additionally provided handout for home exercise program.  If needed could refer to hand therapy in the future.  We will hold off on referral for now.  Patient's wife was recently diagnosed with colon cancer and he is going to be quite busy in the near future.  Keep you updated as needed.Marland Kitchen  PDMP not reviewed this encounter. Orders Placed This Encounter  Procedures  . [place] LIMITED JOINT SPACE STRUCTURES UP RIGHT(NO LINKED CHARGES)    Order Specific Question:   Reason for Exam (SYMPTOM  OR DIAGNOSIS REQUIRED)    Answer:   R wrist pain    Order Specific Question:   Preferred imaging location?    Answer:   [place]   No orders of the defined types were placed in this encounter.   Discussed warning signs or symptoms. Please see discharge instructions. Patient expresses understanding.   The above documentation has been reviewed and is accurate and complete Lynne Leader, M.D.

## 2020-05-13 ENCOUNTER — Other Ambulatory Visit: Payer: Self-pay | Admitting: Family Medicine

## 2020-07-19 ENCOUNTER — Other Ambulatory Visit: Payer: Self-pay | Admitting: Family Medicine

## 2020-08-19 ENCOUNTER — Ambulatory Visit (INDEPENDENT_AMBULATORY_CARE_PROVIDER_SITE_OTHER): Payer: BC Managed Care – PPO | Admitting: Family Medicine

## 2020-08-19 ENCOUNTER — Encounter: Payer: Self-pay | Admitting: Family Medicine

## 2020-08-19 ENCOUNTER — Other Ambulatory Visit: Payer: Self-pay

## 2020-08-19 VITALS — BP 130/84 | HR 65 | Temp 98.2°F | Ht 68.0 in | Wt 193.2 lb

## 2020-08-19 DIAGNOSIS — R350 Frequency of micturition: Secondary | ICD-10-CM

## 2020-08-19 DIAGNOSIS — R351 Nocturia: Secondary | ICD-10-CM

## 2020-08-19 LAB — GLUCOSE, POCT (MANUAL RESULT ENTRY): POC Glucose: 92 mg/dl (ref 70–99)

## 2020-08-19 MED ORDER — MIRABEGRON ER 25 MG PO TB24: 25.0000 mg | ORAL_TABLET | Freq: Every day | ORAL | 11 refills | Status: DC

## 2020-08-19 NOTE — Progress Notes (Signed)
Established Patient Office Visit  Subjective:  Patient ID: Jon Spencer, male    DOB: [birth year]  Age: 69 y.o. MRN: [id number]  CC:  Chief Complaint  Patient presents with  . Nocturia    going a lot at night 4-5 time, no stomach pain , no back or fever, going on for about 3 months    HPI Jon Spencer presents for nocturia.  He has had occasional slow stream in the past but really describing more urgency.  Has been getting up about 4-5 times at night.  No history of diabetes.  No polydipsia.  He does drink some coffee in the mornings but no late day use of caffeine.  Does frequently drink 1 glass of wine at night but has been doing so for quite some time.  Tries to avoid excessive fluid consumption at night.  He states he has significant urgency at night when he does wake-up to go to the bathroom but feels he is emptying his bladder and does not have any significant slow stream.  Last PSA was 1.86 2020.  No recent burning with urination.  He was prescribed Flomax years ago but had problems with abnormal ejaculation.  Has been under tremendous stress this year.  His wife had recurrence of colon cancer.  He had a sister that died of stroke complications and a son that died of sepsis.  Past Medical History:  Diagnosis Date  . ADD 01/17/2010  . Complication of anesthesia    takes awhile to wake up  . History of back surgery 07/2018   Tumor removal  . History of kidney stones    pt. had 2 stones -20 yrs ago  . HYPERLIPIDEMIA 01/18/2009  . HYPERTENSION 01/18/2009  . Impotence of organic origin 10/01/2010  . OSTEOARTHRITIS, HAND 04/17/2010  . TINNITUS 06/26/2009  . TRIGGER FINGER 01/18/2009    Past Surgical History:  Procedure Laterality Date  . APPENDECTOMY  1969  . COLONOSCOPY    . LAMINECTOMY N/A 07/12/2018   Procedure: Lumbar three-four Laminectomy for resection of intradural tumor;  Surgeon: [name], MD;  Location: [facility];  Service: Neurosurgery;  Laterality: N/A;  .  VASECTOMY  1992    Family History  Problem Relation Age of Onset  . Arthritis Other   . Hyperlipidemia Other   . Hypertension Other   . Sudden death Other     Social History   Socioeconomic History  . Marital status: Single    Spouse name: Not on file  . Number of children: Not on file  . Years of education: Not on file  . Highest education level: Not on file  Occupational History  . Not on file  Tobacco Use  . Smoking status: Never Smoker  . Smokeless tobacco: Never Used  Vaping Use  . Vaping Use: Never used  Substance and Sexual Activity  . Alcohol use: Yes    Alcohol/week: 1.0 - 4.0 standard drink    Types: 1 - 4 Glasses of wine per week  . Drug use: Never  . Sexual activity: Not on file  Other Topics Concern  . Not on file  Social History Narrative  . Not on file   Social Determinants of Health   Financial Resource Strain:   . Difficulty of Paying Living Expenses: Not on file  Food Insecurity:   . Worried About Charity fundraiser in the Last Year: Not on file  . Ran Out of Food in the Last Year: Not on  file  Transportation Needs:   . Film/video editor (Medical): Not on file  . Lack of Transportation (Non-Medical): Not on file  Physical Activity:   . Days of Exercise per Week: Not on file  . Minutes of Exercise per Session: Not on file  Stress:   . Feeling of Stress : Not on file  Social Connections:   . Frequency of Communication with Friends and Family: Not on file  . Frequency of Social Gatherings with Friends and Family: Not on file  . Attends Religious Services: Not on file  . Active Member of Clubs or Organizations: Not on file  . Attends Archivist Meetings: Not on file  . Marital Status: Not on file  Intimate Partner Violence:   . Fear of Current or Ex-Partner: Not on file  . Emotionally Abused: Not on file  . Physically Abused: Not on file  . Sexually Abused: Not on file    Outpatient Medications Prior to Visit  Medication  Sig Dispense Refill  . amLODipine (NORVASC) 10 MG tablet TAKE 1 TABLET BY MOUTH EVERY DAY 90 tablet 1  . losartan (COZAAR) 100 MG tablet TAKE 1 TABLET BY MOUTH EVERY DAY 90 tablet 0  . Multiple Vitamins-Minerals (MULTIVITAMIN ADULT) CHEW Chew 1 each by mouth daily.    . naproxen sodium (ALEVE) 220 MG tablet Take 220 mg by mouth daily as needed (pain).    . sildenafil (REVATIO) 20 MG tablet TAKE 2-5 TABLETS BY MOUTH ONCE DAILY AS NEEDED FOR SEXUAL ACTIVITY 100 tablet 1  . VIAGRA 100 MG tablet Take 1 tablet (100 mg total) by mouth daily as needed for erectile dysfunction. 6 tablet 11  . Testosterone 20.25 MG/ACT (1.62%) GEL USE ONE PUMP SPRAY TO EACH ARM ONCE DAILY. (Patient not taking: Reported on 08/19/2020) 75 g 3  . triamcinolone cream (KENALOG) 0.1 % Apply 1 application topically 2 (two) times daily as needed. 30 g 2   No facility-administered medications prior to visit.    Allergies  Allergen Reactions  . Tolectin [Tolmetin Sodium] Other (See Comments)    Fever 104, breathing problems  . Meloxicam Other (See Comments)    REACTION: Flushed, high temp  . Univasc [Moexipril Hydrochloride] Cough    coughing    ROS Review of Systems  Constitutional: Negative for chills and fever.  Gastrointestinal: Negative for abdominal pain.  Endocrine: Negative for polydipsia and polyuria.  Genitourinary: Positive for urgency. Negative for difficulty urinating, dysuria, flank pain and hematuria.      Objective:    Physical Exam Vitals reviewed.  Constitutional:      Appearance: Normal appearance.  Cardiovascular:     Rate and Rhythm: Normal rate and regular rhythm.  Pulmonary:     Effort: Pulmonary effort is normal.     Breath sounds: Normal breath sounds.  Neurological:     Mental Status: He is alert.     BP 130/84 (BP Location: Left Arm, Patient Position: Sitting, Cuff Size: Normal)   Pulse 65   Temp 98.2 F (36.8 C) (Oral)   Ht 5\' 8"  (1.727 m)   Wt 193 lb 3.2 oz (87.6 kg)    SpO2 97%   BMI 29.38 kg/m  Wt Readings from Last 3 Encounters:  08/19/20 193 lb 3.2 oz (87.6 kg)  04/25/20 195 lb 6.4 oz (88.6 kg)  04/17/20 196 lb 3.2 oz (89 kg)     Health Maintenance Due  Topic Date Due  . Hepatitis C Screening  Never done  .  COLONOSCOPY  09/16/2013  . TETANUS/TDAP  11/02/2017  . INFLUENZA VACCINE  06/02/2020    There are no preventive care reminders to display for this patient.  Lab Results  Component Value Date   TSH 1.29 05/18/2017   Lab Results  Component Value Date   WBC 8.2 04/12/2019   HGB 15.7 04/12/2019   HCT 46.2 04/12/2019   MCV 98.1 04/12/2019   PLT 241.0 04/12/2019   Lab Results  Component Value Date   NA 141 06/29/2018   K 3.8 06/29/2018   CO2 24 06/29/2018   GLUCOSE 110 (H) 06/29/2018   BUN 17 06/29/2018   CREATININE 1.23 06/29/2018   BILITOT 0.8 05/18/2017   ALKPHOS 65 05/18/2017   AST 15 05/18/2017   ALT 22 05/18/2017   PROT 6.7 05/18/2017   ALBUMIN 4.4 05/18/2017   CALCIUM 9.5 06/29/2018   ANIONGAP 10 06/29/2018   GFR 64.06 05/18/2017   Lab Results  Component Value Date   CHOL 226 (H) 05/18/2017   Lab Results  Component Value Date   HDL 44.50 05/18/2017   Lab Results  Component Value Date   LDLCALC 155 (H) 05/18/2017   Lab Results  Component Value Date   TRIG 134.0 05/18/2017   Lab Results  Component Value Date   CHOLHDL 5 05/18/2017   No results found for: HGBA1C    Assessment & Plan:   Nocturia and urine urgency.  He has had some mild slow stream in the past but sounds like current symptoms are really predominantly urgency.  Denies any obstructive symptoms at this time.  He has previously had intolerance with Flomax and is not willing to consider that.  We discussed the following measures  -attempted to get urine but pt unable to go. -Avoid caffeine after 1 PM -Avoid alcohol at night other than a few ounces of wine -Consider trial of Myrbetriq 25 mg once daily if we can get this covered.  If not,  consider medication such as oxybutynin at night  Meds ordered this encounter  Medications  . mirabegron ER (MYRBETRIQ) 25 MG TB24 tablet    Sig: Take 1 tablet (25 mg total) by mouth daily.    Dispense:  30 tablet    Refill:  11    Follow-up: No follow-ups on file.    [name], MD

## 2020-08-19 NOTE — Patient Instructions (Signed)
Limit caffeine after 1-2 PM  Limit overall fluids at night  Limit alcohol to one glass at night.

## 2020-09-11 DIAGNOSIS — D0461 Carcinoma in situ of skin of right upper limb, including shoulder: Secondary | ICD-10-CM | POA: Diagnosis not present

## 2020-09-11 DIAGNOSIS — L570 Actinic keratosis: Secondary | ICD-10-CM | POA: Diagnosis not present

## 2020-09-16 ENCOUNTER — Telehealth: Payer: Self-pay | Admitting: Family Medicine

## 2020-09-16 DIAGNOSIS — H9319 Tinnitus, unspecified ear: Secondary | ICD-10-CM

## 2020-09-16 NOTE — Telephone Encounter (Signed)
Patient is calling and wanted to see if Dr. Elease Hashimoto could put in a referral for him to see Dr. Alfonse Alpers for Tinnitus, please advise. Jon Spencer is [phone]

## 2020-09-16 NOTE — Telephone Encounter (Signed)
Referral placed, patient aware 

## 2020-09-16 NOTE — Telephone Encounter (Signed)
OK to refer.  She is an Nurse, children's

## 2020-09-19 ENCOUNTER — Ambulatory Visit: Payer: BC Managed Care – PPO | Attending: Family Medicine | Admitting: Audiologist

## 2020-09-19 ENCOUNTER — Other Ambulatory Visit: Payer: Self-pay

## 2020-09-19 DIAGNOSIS — H903 Sensorineural hearing loss, bilateral: Secondary | ICD-10-CM

## 2020-09-19 DIAGNOSIS — H9313 Tinnitus, bilateral: Secondary | ICD-10-CM

## 2020-09-19 NOTE — Procedures (Signed)
Outpatient Audiology and [place], [place]  [address] [phone]  AUDIOLOGICAL  EVALUATION  NAME: Jon Spencer     DOB:   [birth year]      MRN: [id number]                                                                                     DATE: 09/19/2020     REFERENT: Eulas Post, MD STATUS: Outpatient DIAGNOSIS: Tinnitus of both ears , Sensory hearing loss, bilateral    History: Jon Spencer was seen for an audiological evaluation.  Jon Spencer is receiving a hearing evaluation due to concerns for tinnitus in both ears. Tinnitus described at a tv station turned to a dead channel or the static from a radio. This tinnitus began gradually and has gotten worse over the past year. The tinnitus gets louder with stress. Jon Spencer has difficulty hearing in background noise.  No pain or pressure reported in either ear.  Tinnitus occassionally causes pain the ears lasting a few minutes. Jon Spencer has a significant history of noise exposure from serving in [employer], working with loud machinery and around jets.  Jon Spencer has tried some over the counter tinnitus solutions that did not work. Jon Spencer has previously seen an audiologist who recommended Jon Spencer use music to distract from his tinnitus, this was not helpful.  Medical history negative for any medical risk factor for hearing loss. No other relevant case history reported.   Evaluation:   Otoscopy showed a clear view of the tympanic membranes, bilaterally  Tympanometry results were consistent with normal middle ear function bilaterally    Audiometric testing was completed using conventional audiometry with insert transducer. Speech Recognition Thresholds were consistent with pure tone averages. Word Recognition was excellent at conversation and an elevated level. Pure tone thresholds show normal sloping to severe sensorineural hearing loss in both ears.   Tinnitus matched to a 1.5k Hz narrow band noise tone. Matched at 13dB  SL in the left ear and 15dB SL in the right ear. On a scale of 1-10 with 10 being an exact match, Jon Spencer rated 1.5k Hz at these levels an 8. This tinnitus is centered on a pitched that can be reached by a standard hearing aid.   Results:  The test results were reviewed with Jon Spencer. Jon Spencer was provided with copies of his audiogram. The nature and degree of his tinnitus and hearing loss were explained. Azam's hearing loss is in the high frequencies preventing him from hearing high frequency consonants such as /s/ /sh/ /f/ /t/ and /th/. These sounds help differentiate the words Jon Spencer hears. Without these sounds speechismuffled and unclear. Taras has likely been using lip reading to help him "hear" these high frequency consonants as they are all visible on the lips. If people are wearing masks, talking to him from a distance, with their back turned, in another room, or while sounds like a faucet are running Jon Spencer will be unable to fill in the missing sounds, leading to significant difficulty hearing. All communication needs to take place face to face within 5 feet, even if Thelton gets hearing aids.   The tinnitus in both ears could be  helped by hearing aids. Radley's tinnitus is a symptom of his hearing loss. To treat a symptom, the root cause must be addressed. Murriel says that when Jon Spencer is engrossed in something, like a conversation or TV show, the tinnitus is not bothersome. Hearing aids will make it easier for him to hear and enrich his sound environment. This will reduce his perception of the tinnitus. Some aids even have specialty programming for the treatment of tinnitus through masking and notch therapy. Dail can use his hearing test today for the next 6 months to have hearing aids program. Jon Spencer was given a list of communication strategies and local audiologists who dispense hearing aids. Novak reported understanding all that was discussed today.   Recommendations: 1. Amplification is necessary for both ears. Hearing  aids can be purchased from a variety of locations. See provided list for locations in the [place] area. 2. [facility] has an audiologist who specializes in tinnitus. It is recommended Jon Spencer see Dr. Deatra Ina, or one of her doctoral students, for hearing aids due to her specialized expertise in programming hearing aids to help with tinnitus.    Alfonse Alpers  Audiologist, Au.D., CCC-A 09/19/2020  10:00 AM  Cc: Eulas Post, MD

## 2020-10-19 ENCOUNTER — Other Ambulatory Visit: Payer: Self-pay | Admitting: Family Medicine

## 2020-11-10 ENCOUNTER — Other Ambulatory Visit: Payer: Self-pay | Admitting: Family Medicine

## 2021-01-19 ENCOUNTER — Other Ambulatory Visit: Payer: Self-pay | Admitting: Family Medicine

## 2021-03-18 ENCOUNTER — Ambulatory Visit (INDEPENDENT_AMBULATORY_CARE_PROVIDER_SITE_OTHER): Payer: BC Managed Care – PPO | Admitting: Family Medicine

## 2021-03-18 ENCOUNTER — Encounter: Payer: Self-pay | Admitting: Family Medicine

## 2021-03-18 ENCOUNTER — Other Ambulatory Visit: Payer: Self-pay

## 2021-03-18 VITALS — BP 130/70 | HR 71 | Temp 98.7°F | Wt 197.5 lb

## 2021-03-18 DIAGNOSIS — K5900 Constipation, unspecified: Secondary | ICD-10-CM | POA: Diagnosis not present

## 2021-03-18 DIAGNOSIS — M545 Low back pain, unspecified: Secondary | ICD-10-CM | POA: Diagnosis not present

## 2021-03-18 NOTE — Patient Instructions (Signed)

## 2021-03-18 NOTE — Progress Notes (Signed)
Established Patient Office Visit  Subjective:  Patient ID: Jon Spencer, male    DOB: 1950/12/24  Age: 70 y.o. MRN: [id number]  CC:  Chief Complaint  Patient presents with  . Back Pain    Lower right side, x 3 weeks, no known injury    HPI Jon Spencer presents for recent right lower back pain.  Started about 3 weeks ago.  He did have a stretch of constipation and after having good bowel movement basically his back pain is resolved at this time.  Denies any recent radiculitis symptoms.  No recent fevers or chills.  No dysuria.  No recent stool changes in terms of any bloody stools.  Appetite and weight stable.  Last colonoscopy 2004.  He refuses further colonoscopies.  He states he has had prior colon polyp and is not a good candidate for Cologuard for that reason.  No recent dietary changes.  Past Medical History:  Diagnosis Date  . ADD 01/17/2010  . Complication of anesthesia    takes awhile to wake up  . History of back surgery 07/2018   Tumor removal  . History of kidney stones    pt. had 2 stones -20 yrs ago  . HYPERLIPIDEMIA 01/18/2009  . HYPERTENSION 01/18/2009  . Impotence of organic origin 10/01/2010  . OSTEOARTHRITIS, HAND 04/17/2010  . TINNITUS 06/26/2009  . TRIGGER FINGER 01/18/2009    Past Surgical History:  Procedure Laterality Date  . APPENDECTOMY  1969  . COLONOSCOPY    . LAMINECTOMY N/A 07/12/2018   Procedure: Lumbar three-four Laminectomy for resection of intradural tumor;  Surgeon: [name], MD;  Location: [facility];  Service: Neurosurgery;  Laterality: N/A;  . VASECTOMY  1992    Family History  Problem Relation Age of Onset  . Arthritis Other   . Hyperlipidemia Other   . Hypertension Other   . Sudden death Other     Social History   Socioeconomic History  . Marital status: Married    Spouse name: Not on file  . Number of children: Not on file  . Years of education: Not on file  . Highest education level: Not on file  Occupational History   . Not on file  Tobacco Use  . Smoking status: Never Smoker  . Smokeless tobacco: Never Used  Vaping Use  . Vaping Use: Never used  Substance and Sexual Activity  . Alcohol use: Yes    Alcohol/week: 1.0 - 4.0 standard drink    Types: 1 - 4 Glasses of wine per week  . Drug use: Never  . Sexual activity: Not on file  Other Topics Concern  . Not on file  Social History Narrative  . Not on file   Social Determinants of Health   Financial Resource Strain: Not on file  Food Insecurity: Not on file  Transportation Needs: Not on file  Physical Activity: Not on file  Stress: Not on file  Social Connections: Not on file  Intimate Partner Violence: Not on file    Outpatient Medications Prior to Visit  Medication Sig Dispense Refill  . amLODipine (NORVASC) 10 MG tablet TAKE 1 TABLET BY MOUTH EVERY DAY 90 tablet 1  . losartan (COZAAR) 100 MG tablet TAKE 1 TABLET BY MOUTH EVERY DAY 90 tablet 0  . mirabegron ER (MYRBETRIQ) 25 MG TB24 tablet Take 1 tablet (25 mg total) by mouth daily. 30 tablet 11  . Multiple Vitamins-Minerals (MULTIVITAMIN ADULT) CHEW Chew 1 each by mouth daily.    Marland Kitchen  naproxen sodium (ALEVE) 220 MG tablet Take 220 mg by mouth daily as needed (pain).    . sildenafil (REVATIO) 20 MG tablet TAKE 2-5 TABLETS BY MOUTH ONCE DAILY AS NEEDED FOR SEXUAL ACTIVITY 100 tablet 1  . VIAGRA 100 MG tablet Take 1 tablet (100 mg total) by mouth daily as needed for erectile dysfunction. 6 tablet 11  . Testosterone 20.25 MG/ACT (1.62%) GEL USE ONE PUMP SPRAY TO EACH ARM ONCE DAILY. (Patient not taking: Reported on 08/19/2020) 75 g 3   No facility-administered medications prior to visit.    Allergies  Allergen Reactions  . Tolectin [Tolmetin Sodium] Other (See Comments)    Fever 104, breathing problems  . Meloxicam Other (See Comments)    REACTION: Flushed, high temp  . Univasc [Moexipril Hydrochloride] Cough    coughing    ROS Review of Systems  Constitutional: Negative for  appetite change, chills, fever and unexpected weight change.  Gastrointestinal: Positive for constipation. Negative for abdominal pain, blood in stool, nausea and vomiting.  Genitourinary: Negative for difficulty urinating and dysuria.  Musculoskeletal:       See HPI  Neurological: Negative for weakness and numbness.      Objective:    Physical Exam Vitals reviewed.  Constitutional:      Appearance: Normal appearance.  Cardiovascular:     Rate and Rhythm: Normal rate and regular rhythm.  Pulmonary:     Effort: Pulmonary effort is normal.     Breath sounds: Normal breath sounds.  Musculoskeletal:     Comments: Straight leg raises are negative bilaterally  Neurological:     Mental Status: He is alert.     Comments: Full strength lower extremities with symmetric reflexes knee and ankle bilaterally     BP 130/70 (BP Location: Left Arm, Patient Position: Sitting, Cuff Size: Normal)   Pulse 71   Temp 98.7 F (37.1 C) (Oral)   Wt 197 lb 8 oz (89.6 kg)   SpO2 96%   BMI 30.03 kg/m  Wt Readings from Last 3 Encounters:  03/18/21 197 lb 8 oz (89.6 kg)  08/19/20 193 lb 3.2 oz (87.6 kg)  04/25/20 195 lb 6.4 oz (88.6 kg)     Health Maintenance Due  Topic Date Due  . Hepatitis C Screening  Never done  . COLONOSCOPY (Pts 45-83yrs Insurance coverage will need to be confirmed)  09/16/2013  . TETANUS/TDAP  11/02/2017    There are no preventive care reminders to display for this patient.  Lab Results  Component Value Date   TSH 1.29 05/18/2017   Lab Results  Component Value Date   WBC 8.2 04/12/2019   HGB 15.7 04/12/2019   HCT 46.2 04/12/2019   MCV 98.1 04/12/2019   PLT 241.0 04/12/2019   Lab Results  Component Value Date   NA 141 06/29/2018   K 3.8 06/29/2018   CO2 24 06/29/2018   GLUCOSE 110 (H) 06/29/2018   BUN 17 06/29/2018   CREATININE 1.23 06/29/2018   BILITOT 0.8 05/18/2017   ALKPHOS 65 05/18/2017   AST 15 05/18/2017   ALT 22 05/18/2017   PROT 6.7  05/18/2017   ALBUMIN 4.4 05/18/2017   CALCIUM 9.5 06/29/2018   ANIONGAP 10 06/29/2018   GFR 64.06 05/18/2017   Lab Results  Component Value Date   CHOL 226 (H) 05/18/2017   Lab Results  Component Value Date   HDL 44.50 05/18/2017   Lab Results  Component Value Date   LDLCALC 155 (H) 05/18/2017   Lab  Results  Component Value Date   TRIG 134.0 05/18/2017   Lab Results  Component Value Date   CHOLHDL 5 05/18/2017   No results found for: HGBA1C    Assessment & Plan:   Recent acute right lower lumbar back pain.  Pain resolved after resolution of constipation symptoms.  Nonfocal exam at this time  -Recommend observation.  Discussed measures to reduce constipation.  Consider trial of MiraLAX 17 g daily as needed for any recurrent constipation symptoms  No orders of the defined types were placed in this encounter.   Follow-up: No follow-ups on file.    [name], MD

## 2021-04-24 ENCOUNTER — Other Ambulatory Visit: Payer: Self-pay | Admitting: Family Medicine

## 2021-04-28 NOTE — Progress Notes (Signed)
I, Wendy Poet, LAT, ATC, am serving as scribe for Dr. Lynne Leader.  Jon Spencer is a 70 y.o. male who presents to [place] at [facility] today for f/u of R wrist pain. Pt was last seen by Dr. Georgina Snell on 04/25/20 for R De Quervain's tenosynovitis and was given a R 1st dorsal wrist steroid injection. Today, pt reports that his pain has returned in his R radial wrist/thumb area but notes that it is not as bad as previously.  He denies any swelling.  Aggravating factors include R wrist pronation, heavy resisted motion.  He also reports a nodule at his L 4th MCP joint that intermittently appears and is painful.  He has been wearing a thumb spica brace and taking Aleve.  He is also using Voltaren gel.   Pertinent review of systems: No fevers or chills  Relevant historical information: Hypertension.  History of right de Quervain's tenosynovitis last year.   Exam:  BP (!) 150/90 (BP Location: Right Arm, Patient Position: Sitting, Cuff Size: Normal)   Pulse 75   Ht 5\' 8"  (1.727 m)   Wt 199 lb 12.8 oz (90.6 kg)   SpO2 95%   BMI 30.38 kg/m  General: Well Developed, well nourished, and in no acute distress.   MSK: Right wrist normal. Tender palpation radial styloid at first and third dorsal wrist compartments. Positive Finkelstein's test.  Left hand third digit mildly tender palpation volar MCP.  No triggering present.    Lab and Radiology Results  Procedure: Real-time Ultrasound Guided Injection of right first dorsal wrist compartment tendon sheath (de Quervain injection) Device: Philips Affiniti 50G Images permanently stored and available for review in PACS Verbal informed consent obtained.  Discussed risks and benefits of procedure. Warned about infection bleeding damage to structures skin hypopigmentation and fat atrophy among others. Patient expresses understanding and agreement Time-out conducted.   Noted no overlying erythema, induration, or other signs of  local infection.   Skin prepped in a sterile fashion.   Local anesthesia: Topical Ethyl chloride.   With sterile technique and under real time ultrasound guidance: 40 mg of Kenalog and 1 mL of lidocaine injected into tendon sheath. Fluid seen entering the tendon sheath.   Completed without difficulty   Pain immediately resolved suggesting accurate placement of the medication.   Advised to call if fevers/chills, erythema, induration, drainage, or persistent bleeding.   Images permanently stored and available for review in the ultrasound unit.  Impression: Technically successful ultrasound guided injection.    Procedure: Real-time Ultrasound Guided Injection of left third MCP ganglion cyst associated with A1 pulley Device: Philips Affiniti 50G Images permanently stored and available for review in PACS Ultrasound evaluation prior to injection reveals small ganglion cyst associated with tendon sheath Verbal informed consent obtained.  Discussed risks and benefits of procedure. Warned about infection bleeding damage to structures skin hypopigmentation and fat atrophy among others. Patient expresses understanding and agreement Time-out conducted.   Noted no overlying erythema, induration, or other signs of local infection.   Skin prepped in a sterile fashion.   Local anesthesia: Topical Ethyl chloride.   With sterile technique and under real time ultrasound guidance: 20 mg of Kenalog (0.5 mL of 40 mg/mL solution) and 0.5 mL of lidocaine injected into ganglion cyst. Fluid seen entering the ganglion cyst.   Completed without difficulty   Pain immediately resolved suggesting accurate placement of the medication.   Advised to call if fevers/chills, erythema, induration, drainage, or persistent bleeding.  Images permanently stored and available for review in the ultrasound unit.  Impression: Technically successful ultrasound guided injection.        Assessment and Plan: 70 y.o. male with  right de Quervain's tenosynovitis.  This is recurrent problem patient had injection about 1 year ago.  Plan for repeat injection today and continued thumb spica splint.  Additionally use Voltaren gel.  Left palmar hand pain associate with third MCP.  Ultrasound evaluation prior to injection showed small ganglion cyst.  I believe at times this gets bigger and causes more of a problem.  After discussion plan for injection today.  Recheck as needed.   PDMP not reviewed this encounter. Orders Placed This Encounter  Procedures   [place] LIMITED JOINT SPACE STRUCTURES UP RIGHT(NO LINKED CHARGES)    Order Specific Question:   Reason for Exam (SYMPTOM  OR DIAGNOSIS REQUIRED)    Answer:   R wrist pain    Order Specific Question:   Preferred imaging location?    Answer:   [place]   No orders of the defined types were placed in this encounter.    Discussed warning signs or symptoms. Please see discharge instructions. Patient expresses understanding.   The above documentation has been reviewed and is accurate and complete Lynne Leader, M.D.

## 2021-04-29 ENCOUNTER — Ambulatory Visit: Payer: Self-pay

## 2021-04-29 ENCOUNTER — Encounter: Payer: Self-pay | Admitting: Family Medicine

## 2021-04-29 ENCOUNTER — Ambulatory Visit (INDEPENDENT_AMBULATORY_CARE_PROVIDER_SITE_OTHER): Payer: BC Managed Care – PPO | Admitting: Family Medicine

## 2021-04-29 ENCOUNTER — Other Ambulatory Visit: Payer: Self-pay

## 2021-04-29 VITALS — BP 150/90 | HR 75 | Ht 68.0 in | Wt 199.8 lb

## 2021-04-29 DIAGNOSIS — M654 Radial styloid tenosynovitis [de Quervain]: Secondary | ICD-10-CM | POA: Diagnosis not present

## 2021-04-29 DIAGNOSIS — M79642 Pain in left hand: Secondary | ICD-10-CM | POA: Diagnosis not present

## 2021-04-29 DIAGNOSIS — M67442 Ganglion, left hand: Secondary | ICD-10-CM | POA: Insufficient documentation

## 2021-04-29 DIAGNOSIS — M67432 Ganglion, left wrist: Secondary | ICD-10-CM | POA: Diagnosis not present

## 2021-04-29 DIAGNOSIS — M25531 Pain in right wrist: Secondary | ICD-10-CM

## 2021-04-29 NOTE — Patient Instructions (Addendum)
Good to see you today.  You had a R wrist and L hand injection today.  Call or go to the ER if you develop a large red swollen joint with extreme pain or oozing puss.   Thumb spica splint/brace.   Recheck as needed.   Please use Voltaren gel (Generic Diclofenac Gel) up to 4x daily for pain as needed.  This is available over-the-counter as both the name brand Voltaren gel and the generic diclofenac gel.

## 2021-04-30 DIAGNOSIS — M654 Radial styloid tenosynovitis [de Quervain]: Secondary | ICD-10-CM | POA: Diagnosis not present

## 2021-04-30 DIAGNOSIS — M25531 Pain in right wrist: Secondary | ICD-10-CM | POA: Diagnosis not present

## 2021-05-12 ENCOUNTER — Other Ambulatory Visit: Payer: Self-pay | Admitting: Family Medicine

## 2021-06-23 ENCOUNTER — Telehealth: Payer: Self-pay

## 2021-06-23 NOTE — Telephone Encounter (Signed)
Spoke with the patient. He stated it is a wart on the side on his nose. Pt advise to make an appointment to see if Dr. Elease Hashimoto can remove this or if a referral is needed. Appointment has been made.

## 2021-06-23 NOTE — Telephone Encounter (Signed)
Patient call stating he has a growth on the side of nose and wants to know if PCP will remove it or if he needs a referral to see another doctor

## 2021-06-25 ENCOUNTER — Other Ambulatory Visit: Payer: Self-pay

## 2021-06-25 ENCOUNTER — Ambulatory Visit (INDEPENDENT_AMBULATORY_CARE_PROVIDER_SITE_OTHER): Payer: BC Managed Care – PPO | Admitting: Family Medicine

## 2021-06-25 ENCOUNTER — Encounter: Payer: Self-pay | Admitting: Family Medicine

## 2021-06-25 VITALS — BP 140/70 | HR 70 | Temp 98.1°F | Wt 196.3 lb

## 2021-06-25 DIAGNOSIS — C44311 Basal cell carcinoma of skin of nose: Secondary | ICD-10-CM

## 2021-06-25 DIAGNOSIS — L989 Disorder of the skin and subcutaneous tissue, unspecified: Secondary | ICD-10-CM | POA: Diagnosis not present

## 2021-06-25 NOTE — Progress Notes (Signed)
Established Patient Office Visit  Subjective:  Patient ID: Jon Spencer, male    DOB: 07/05/1951  Age: 70 y.o. MRN: [id number]  CC:  Chief Complaint  Patient presents with   wart removal    Right side on nose    HPI Jon Spencer presents for concern for skin lesion right side of nose near the base.  He states has been present for months and slowly growing in size.  Denies any history of skin cancer.  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  Tries to avoid excessive sun at this time.  Past Medical History:  Diagnosis Date   ADD [id number]   Complication of anesthesia    takes awhile to wake up   History of back surgery 07/2018   Tumor removal   History of kidney stones    pt. 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/01/2010   OSTEOARTHRITIS, HAND 04/17/2010   TINNITUS 06/26/2009   TRIGGER FINGER 01/18/2009    Past Surgical History:  Procedure Laterality Date   APPENDECTOMY  1969   COLONOSCOPY     LAMINECTOMY N/A 07/12/2018   Procedure: Lumbar three-four Laminectomy for resection of intradural tumor;  Surgeon: [name], MD;  Location: [facility];  Service: Neurosurgery;  Laterality: N/A;   VASECTOMY  1992    Family History  Problem Relation Age of Onset   Arthritis Other    Hyperlipidemia Other    Hypertension Other    Sudden death Other     Social History   Socioeconomic History   Marital status: Married    Spouse name: Not on file   Number of children: Not on file   Years of education: Not on file   Highest education level: Not on file  Occupational History   Not on file  Tobacco Use   Smoking status: Never   Smokeless tobacco: Never  Vaping Use   Vaping Use: Never used  Substance and Sexual Activity   Alcohol use: Yes    Alcohol/week: 1.0 - 4.0 standard drink    Types: 1 - 4 Glasses of wine per week   Drug use: Never    Sexual activity: Not on file  Other Topics Concern   Not on file  Social History Narrative   Not on file   Social Determinants of Health   Financial Resource Strain: Not on file  Food Insecurity: Not on file  Transportation Needs: Not on file  Physical Activity: Not on file  Stress: Not on file  Social Connections: Not on file  Intimate Partner Violence: Not on file    Outpatient Medications Prior to Visit  Medication Sig Dispense Refill   amLODipine (NORVASC) 10 MG tablet TAKE 1 TABLET BY MOUTH EVERY DAY 90 tablet 1   losartan (COZAAR) 100 MG tablet TAKE 1 TABLET BY MOUTH EVERY DAY 90 tablet 0   mirabegron ER (MYRBETRIQ) 25 MG TB24 tablet Take 1 tablet (25 mg total) by mouth daily. 30 tablet 11   Multiple Vitamins-Minerals (MULTIVITAMIN ADULT) CHEW Chew 1 each by mouth daily.     naproxen sodium (ALEVE) 220 MG tablet Take 220 mg by mouth daily as needed (pain).     sildenafil (REVATIO) 20 MG tablet TAKE 2-5 TABLETS BY MOUTH ONCE DAILY AS NEEDED FOR SEXUAL ACTIVITY 100 tablet 1   VIAGRA 100 MG tablet  Take 1 tablet (100 mg total) by mouth daily as needed for erectile dysfunction. 6 tablet 11   No facility-administered medications prior to visit.    Allergies  Allergen Reactions   Tolectin [Tolmetin Sodium] Other (See Comments)    Fever 104, breathing problems   Meloxicam Other (See Comments)    REACTION: Flushed, high temp   Univasc [Moexipril Hydrochloride] Cough    coughing    ROS Review of Systems  Constitutional:  Negative for appetite change and unexpected weight change.     Objective:    Physical Exam Vitals reviewed.  Constitutional:      Appearance: Normal appearance.  Cardiovascular:     Rate and Rhythm: Normal rate and regular rhythm.  Skin:    Comments: He has small nodular lesion about 6 mm diameter left side of nose with raised border and umbilicated center with some telangiectasias.  No ulceration.  On the right side of his nose near the base he  has hyperkeratotic thickened scaly lesion which is about 8 mm diameter  Neurological:     Mental Status: He is alert.    BP 140/70 (BP Location: Left Arm, Patient Position: Sitting, Cuff Size: Normal)   Pulse 70   Temp 98.1 F (36.7 C) (Oral)   Wt 196 lb 4.8 oz (89 kg)   SpO2 97%   BMI 29.85 kg/m  Wt Readings from Last 3 Encounters:  06/25/21 196 lb 4.8 oz (89 kg)  04/29/21 199 lb 12.8 oz (90.6 kg)  03/18/21 197 lb 8 oz (89.6 kg)     Health Maintenance Due  Topic Date Due   Hepatitis C Screening  Never done   Zoster Vaccines- Shingrix (1 of 2) Never done   COLONOSCOPY (Pts 45-20yr Insurance coverage will need to be confirmed)  09/16/2013   TETANUS/TDAP  11/02/2017   COVID-19 Vaccine (4 - Booster for Moderna series) 12/29/2020   INFLUENZA VACCINE  06/02/2021    There are no preventive care reminders to display for this patient.  Lab Results  Component Value Date   TSH 1.29 05/18/2017   Lab Results  Component Value Date   WBC 8.2 04/12/2019   HGB 15.7 04/12/2019   HCT 46.2 04/12/2019   MCV 98.1 04/12/2019   PLT 241.0 04/12/2019   Lab Results  Component Value Date   NA 141 06/29/2018   K 3.8 06/29/2018   CO2 24 06/29/2018   GLUCOSE 110 (H) 06/29/2018   BUN 17 06/29/2018   CREATININE 1.23 06/29/2018   BILITOT 0.8 05/18/2017   ALKPHOS 65 05/18/2017   AST 15 05/18/2017   ALT 22 05/18/2017   PROT 6.7 05/18/2017   ALBUMIN 4.4 05/18/2017   CALCIUM 9.5 06/29/2018   ANIONGAP 10 06/29/2018   GFR 64.06 05/18/2017   Lab Results  Component Value Date   CHOL 226 (H) 05/18/2017   Lab Results  Component Value Date   HDL 44.50 05/18/2017   Lab Results  Component Value Date   LDLCALC 155 (H) 05/18/2017   Lab Results  Component Value Date   TRIG 134.0 05/18/2017   Lab Results  Component Value Date   CHOLHDL 5 05/18/2017   No results found for: HGBA1C    Assessment & Plan:   #1 nodular lesion left side of nose.  Suspect probable basal cell  carcinoma -Given location recommend dermatology referral and this will be set up  #2 scaly lesion right side of nose.  Question actinic keratosis versus early squamous cell carcinoma  -Setting  up Derm referral as above   No orders of the defined types were placed in this encounter.   Follow-up: No follow-ups on file.    [name], MD

## 2021-07-14 ENCOUNTER — Telehealth: Payer: Self-pay | Admitting: Family Medicine

## 2021-07-14 NOTE — Telephone Encounter (Signed)
PT called to find out what is going on in regards to a referral to a skin center place to treat for his right side of nose issue. Please advise as currently just see the referral but no info.

## 2021-07-15 NOTE — Telephone Encounter (Signed)
Spoke with deborah. She informed me that she has already taken care of this and spoke with the patient's wife.

## 2021-07-15 NOTE — Telephone Encounter (Signed)
Reached out to [employer] for assistance with this.

## 2021-07-20 ENCOUNTER — Other Ambulatory Visit: Payer: Self-pay | Admitting: Family Medicine

## 2021-08-20 ENCOUNTER — Other Ambulatory Visit: Payer: Self-pay | Admitting: Family Medicine

## 2021-08-25 ENCOUNTER — Telehealth: Payer: Self-pay

## 2021-08-25 NOTE — Telephone Encounter (Signed)
I was unable to cancel the appointment from my end. I reached out to the patient and provided him with the contact information for [facility]. He will call to cancel the appointment.

## 2021-08-25 NOTE — Telephone Encounter (Signed)
Patient called stating he would like to cancel dermatology appt but could not remember where the appt was scheduled  and asked if PCP's nurse would cancel for him.

## 2021-09-08 ENCOUNTER — Telehealth: Payer: Self-pay | Admitting: Family Medicine

## 2021-09-08 NOTE — Telephone Encounter (Signed)
Called patient prior authorization is what is needed, not refills.   Informed patient, we will being this process and will be in touch.

## 2021-09-08 NOTE — Telephone Encounter (Signed)
Patient called to get refill on MYRBETRIQ 25 MG TB24 tablet      Please send to   [id number] - [place], [place] - [address] Phone:  [phone]  Fax:  [phone]       Please advise

## 2021-09-08 NOTE — Telephone Encounter (Signed)
Your information has been submitted and will be reviewed by [employer]. You may close this dialog, return to your dashboard, and perform other tasks. An electronic determination will be received in CoverMyMeds within 72-120 hours. You can see the latest determination by locating this request on your dashboard or by reopening this request. You will receive a fax copy of the determination. If [employer] has not responded in 120 hours, contact [employer] at [phone]. 

## 2021-09-09 NOTE — Telephone Encounter (Signed)
Prior authorization for  Myrbetriq 25 Approved today CaseId:[id number];Status:Approved;Review Type:Prior Auth;Coverage Start Date:09/08/2021;Coverage End Date:09/09/2022;  [facility] [place]  spoke [facility] Brianna, medication will be ordered and filled for one month supply at cost of $20.   Pharmacy  will call patient on 09/10/2021 for pick up once medication had been filled.

## 2021-10-20 ENCOUNTER — Other Ambulatory Visit: Payer: Self-pay | Admitting: Family Medicine

## 2021-11-07 ENCOUNTER — Ambulatory Visit (INDEPENDENT_AMBULATORY_CARE_PROVIDER_SITE_OTHER): Payer: Managed Care, Other (non HMO) | Admitting: Family Medicine

## 2021-11-07 ENCOUNTER — Encounter: Payer: Self-pay | Admitting: Family Medicine

## 2021-11-07 VITALS — BP 138/70 | HR 81 | Temp 98.1°F | Wt 200.6 lb

## 2021-11-07 DIAGNOSIS — N529 Male erectile dysfunction, unspecified: Secondary | ICD-10-CM | POA: Diagnosis not present

## 2021-11-07 DIAGNOSIS — R3915 Urgency of urination: Secondary | ICD-10-CM | POA: Diagnosis not present

## 2021-11-07 DIAGNOSIS — M7918 Myalgia, other site: Secondary | ICD-10-CM | POA: Diagnosis not present

## 2021-11-07 MED ORDER — MIRABEGRON ER 50 MG PO TB24: 50.0000 mg | ORAL_TABLET | Freq: Every day | ORAL | 5 refills | Status: DC

## 2021-11-07 MED ORDER — SILDENAFIL CITRATE 20 MG PO TABS: ORAL_TABLET | ORAL | 1 refills | Status: DC

## 2021-11-07 MED ORDER — PREDNISONE 10 MG PO TABS: ORAL_TABLET | ORAL | 0 refills | Status: DC

## 2021-11-07 NOTE — Progress Notes (Signed)
Established Patient Office Visit  Subjective:  Patient ID: Jon Spencer, male    DOB: [birth year]  Age: 71 y.o. MRN: [id number]  CC:  Chief Complaint  Patient presents with   Hip Pain    HPI GERGORY BIELLO presents for the following items  He has had about a month now some pain mostly right buttock region.  He and his family went to [place] prior to Christmas and he states he was walking about 9 miles per day.  Tolerated walking fairly well but has had pain mostly with changing positions such as seated to standing.  No radiculitis symptoms.  Denies any lower extremity numbness or weakness.  He has taken occasional Aleve which helps.  Denies any lower lumbar pain.  No recent appetite or weight changes.  No fever.  No rash.  Other issue is urinary urgency.  He takes Myrbetriq 25 mg at night.  Still occasionally gets up 3-4 times at night.  Some sense of urgency.  He would like to consider titration in dosage.  No recent burning with urination.  No major obstructive symptoms.  Past Medical History:  Diagnosis Date   ADD 9/41/7408   Complication of anesthesia    takes awhile to wake up   History of back surgery 07/2018   Tumor removal   History of kidney stones    pt. 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/01/2010   OSTEOARTHRITIS, HAND 04/17/2010   TINNITUS 06/26/2009   TRIGGER FINGER 01/18/2009    Past Surgical History:  Procedure Laterality Date   APPENDECTOMY  1969   COLONOSCOPY     LAMINECTOMY N/A 07/12/2018   Procedure: Lumbar three-four Laminectomy for resection of intradural tumor;  Surgeon: [name], MD;  Location: [facility];  Service: Neurosurgery;  Laterality: N/A;   VASECTOMY  1992    Family History  Problem Relation Age of Onset   Arthritis Other    Hyperlipidemia Other    Hypertension Other    Sudden death Other     Social History   Socioeconomic History   Marital status: Married     Spouse name: Not on file   Number of children: Not on file   Years of education: Not on file   Highest education level: Not on file  Occupational History   Not on file  Tobacco Use   Smoking status: Never   Smokeless tobacco: Never  Vaping Use   Vaping Use: Never used  Substance and Sexual Activity   Alcohol use: Yes    Alcohol/week: 1.0 - 4.0 standard drink    Types: 1 - 4 Glasses of wine per week   Drug use: Never   Sexual activity: Not on file  Other Topics Concern   Not on file  Social History Narrative   Not on file   Social Determinants of Health   Financial Resource Strain: Not on file  Food Insecurity: Not on file  Transportation Needs: Not on file  Physical Activity: Not on file  Stress: Not on file  Social Connections: Not on file  Intimate Partner Violence: Not on file    Outpatient Medications Prior to Visit  Medication Sig Dispense Refill   amLODipine (NORVASC) 10 MG tablet TAKE 1 TABLET BY MOUTH EVERY DAY 90 tablet 1   losartan (COZAAR) 100 MG tablet TAKE 1 TABLET BY MOUTH EVERY DAY 90 tablet 0   Multiple Vitamins-Minerals (MULTIVITAMIN ADULT)  CHEW Chew 1 each by mouth daily.     VIAGRA 100 MG tablet Take 1 tablet (100 mg total) by mouth daily as needed for erectile dysfunction. 6 tablet 11   MYRBETRIQ 25 MG TB24 tablet TAKE 1 TABLET BY MOUTH EVERY DAY 90 tablet 3   naproxen sodium (ALEVE) 220 MG tablet Take 220 mg by mouth daily as needed (pain).     sildenafil (REVATIO) 20 MG tablet TAKE 2-5 TABLETS BY MOUTH ONCE DAILY AS NEEDED FOR SEXUAL ACTIVITY 100 tablet 1   No facility-administered medications prior to visit.    Allergies  Allergen Reactions   Tolectin [Tolmetin Sodium] Other (See Comments)    Fever 104, breathing problems   Meloxicam Other (See Comments)    REACTION: Flushed, high temp   Univasc [Moexipril Hydrochloride] Cough    coughing    ROS Review of Systems  Constitutional:  Negative for appetite change, chills, fever and  unexpected weight change.  Respiratory:  Negative for cough and shortness of breath.   Cardiovascular:  Negative for chest pain.  Gastrointestinal:  Negative for abdominal pain.     Objective:    Physical Exam Vitals reviewed.  Constitutional:      Appearance: Normal appearance.  Cardiovascular:     Rate and Rhythm: Normal rate and regular rhythm.  Pulmonary:     Effort: Pulmonary effort is normal.     Breath sounds: Normal breath sounds.  Musculoskeletal:     Right lower leg: No edema.     Left lower leg: No edema.     Comments: Right hip reveals excellent range of motion.  No sacroiliac tenderness.  Straight leg raise negative.  Neurological:     Mental Status: He is alert.     Comments: Full strength lower extremities    BP 138/70 (BP Location: Left Arm, Patient Position: Sitting, Cuff Size: Normal)    Pulse 81    Temp 98.1 F (36.7 C) (Oral)    Wt 200 lb 9.6 oz (91 kg)    SpO2 98%    BMI 30.50 kg/m  Wt Readings from Last 3 Encounters:  11/07/21 200 lb 9.6 oz (91 kg)  06/25/21 196 lb 4.8 oz (89 kg)  04/29/21 199 lb 12.8 oz (90.6 kg)     Health Maintenance Due  Topic Date Due   Pneumonia Vaccine 83+ Years old (1 - PCV) Never done   Hepatitis C Screening  Never done   Zoster Vaccines- Shingrix (1 of 2) Never done   COLONOSCOPY (Pts 45-59yrs Insurance coverage will need to be confirmed)  09/16/2013   TETANUS/TDAP  11/02/2017   COVID-19 Vaccine (4 - Booster) 10/23/2020    There are no preventive care reminders to display for this patient.  Lab Results  Component Value Date   TSH 1.29 05/18/2017   Lab Results  Component Value Date   WBC 8.2 04/12/2019   HGB 15.7 04/12/2019   HCT 46.2 04/12/2019   MCV 98.1 04/12/2019   PLT 241.0 04/12/2019   Lab Results  Component Value Date   NA 141 06/29/2018   K 3.8 06/29/2018   CO2 24 06/29/2018   GLUCOSE 110 (H) 06/29/2018   BUN 17 06/29/2018   CREATININE 1.23 06/29/2018   BILITOT 0.8 05/18/2017   ALKPHOS 65  05/18/2017   AST 15 05/18/2017   ALT 22 05/18/2017   PROT 6.7 05/18/2017   ALBUMIN 4.4 05/18/2017   CALCIUM 9.5 06/29/2018   ANIONGAP 10 06/29/2018   GFR 64.06 05/18/2017  Lab Results  Component Value Date   CHOL 226 (H) 05/18/2017   Lab Results  Component Value Date   HDL 44.50 05/18/2017   Lab Results  Component Value Date   LDLCALC 155 (H) 05/18/2017   Lab Results  Component Value Date   TRIG 134.0 05/18/2017   Lab Results  Component Value Date   CHOLHDL 5 05/18/2017   No results found for: HGBA1C    Assessment & Plan:   #1 right buttock pain.  Question sacroiliac strain.  He has had intolerance with couple of nonsteroidals previously but has tolerated prednisone well in the past.  We agreed to trial of prednisone taper.  If not improving with the above consider sports medicine referral  #2 history of erectile dysfunction.  He is requesting refills of sildenafil.  New prescription written  #3 urinary urgency.  Patient still has some nocturia.  He avoids caffeine after about 12 noon.  Titrate Myrbetriq to 50 mg at night  Meds ordered this encounter  Medications   predniSONE (DELTASONE) 10 MG tablet    Sig: Taper as follows: 4-4-4-3-3-3-2-2-1-1    Dispense:  28 tablet    Refill:  0   mirabegron ER (MYRBETRIQ) 50 MG TB24 tablet    Sig: Take 1 tablet (50 mg total) by mouth daily.    Dispense:  30 tablet    Refill:  5   sildenafil (REVATIO) 20 MG tablet    Sig: TAKE 2-5 TABLETS BY MOUTH ONCE DAILY AS NEEDED FOR SEXUAL ACTIVITY    Dispense:  100 tablet    Refill:  1    Generic For:*REVATIO 20MG     Follow-up: No follow-ups on file.    [name], MD

## 2021-11-07 NOTE — Patient Instructions (Signed)
Let me know if back/buttock pain not improving on the prednisone.

## 2021-11-09 ENCOUNTER — Encounter: Payer: Self-pay | Admitting: Family Medicine

## 2021-11-13 ENCOUNTER — Other Ambulatory Visit: Payer: Self-pay | Admitting: Family Medicine

## 2021-11-14 ENCOUNTER — Telehealth: Payer: Self-pay | Admitting: Family Medicine

## 2021-11-14 MED ORDER — PREDNISONE 10 MG PO TABS: ORAL_TABLET | ORAL | 0 refills | Status: DC

## 2021-11-14 NOTE — Telephone Encounter (Signed)
Rx sent in. Spoke with the patient and he is aware.

## 2021-11-14 NOTE — Telephone Encounter (Signed)
Please advise. This was sent in on 11/07/21. Should the patient continue this?  Patient states he has had some improvement but not much. Pain is 7/10.

## 2021-11-14 NOTE — Telephone Encounter (Signed)
Patient called in requesting a refill for predniSONE (DELTASONE) 10 MG tablet [[id number]] to be sent to his pharmacy. Patient stated that he would be going out of town in a few days so he would like this to be sent as soon as possible.  Patient could be contacted at [phone].  Please advise.

## 2021-11-21 ENCOUNTER — Telehealth: Payer: Self-pay | Admitting: Family Medicine

## 2021-11-21 NOTE — Telephone Encounter (Signed)
Patient called and stated that pharmacy needs an approval from insurance for them to refill his medication. Patient is going to run out before he leave town on Sunday for his business trip. Patient was unsure on if pharmacy needed a PA exactly, he stated they said they needed "approval from insurance"    Please send to  [id number] - [place], [place] - [address] Phone:  [phone]  Fax:  [phone]         Please advise

## 2021-11-21 NOTE — Telephone Encounter (Signed)
Medication is mirabegron ER (MYRBETRIQ) 50 MG TB24 tablet     Please advise

## 2021-11-21 NOTE — Telephone Encounter (Signed)
PA sent to cover my meds.  KeyGilberto Spencer - PA Case ID: [id number]  Spoke with the patient and he is aware the PA has been sent to his insurance.

## 2021-11-26 NOTE — Telephone Encounter (Signed)
Checked on the status of the PA on cover my meds. PA is still under review.  Left a detailed message on verified voice mail informing the patient of the PA status.

## 2021-11-28 NOTE — Telephone Encounter (Signed)
PA has been approved. Will call the patient.

## 2021-11-28 NOTE — Telephone Encounter (Signed)
Spoke with the patient. He is aware his PA has been approved.

## 2022-02-06 ENCOUNTER — Other Ambulatory Visit: Payer: Self-pay | Admitting: Family Medicine

## 2022-02-11 ENCOUNTER — Emergency Department (HOSPITAL_BASED_OUTPATIENT_CLINIC_OR_DEPARTMENT_OTHER): Payer: Commercial Managed Care - PPO

## 2022-02-11 ENCOUNTER — Encounter (HOSPITAL_BASED_OUTPATIENT_CLINIC_OR_DEPARTMENT_OTHER): Payer: Self-pay | Admitting: Urology

## 2022-02-11 ENCOUNTER — Other Ambulatory Visit: Payer: Self-pay

## 2022-02-11 ENCOUNTER — Emergency Department (HOSPITAL_BASED_OUTPATIENT_CLINIC_OR_DEPARTMENT_OTHER)
Admission: EM | Admit: 2022-02-11 | Discharge: 2022-02-11 | Disposition: A | Discharge status: Home / Self Care | Payer: Commercial Managed Care - PPO | Attending: Emergency Medicine | Admitting: Emergency Medicine

## 2022-02-11 DIAGNOSIS — R7989 Other specified abnormal findings of blood chemistry: Secondary | ICD-10-CM | POA: Insufficient documentation

## 2022-02-11 DIAGNOSIS — L03213 Periorbital cellulitis: Secondary | ICD-10-CM | POA: Diagnosis not present

## 2022-02-11 DIAGNOSIS — R519 Headache, unspecified: Secondary | ICD-10-CM | POA: Diagnosis present

## 2022-02-11 LAB — CBC WITH DIFFERENTIAL/PLATELET
Abs Immature Granulocytes: 0.02 10*3/uL (ref 0.00–0.07)
Basophils Absolute: 0.1 10*3/uL (ref 0.0–0.1)
Basophils Relative: 1 %
Eosinophils Absolute: 0.2 10*3/uL (ref 0.0–0.5)
Eosinophils Relative: 3 %
HCT: 49.8 % (ref 39.0–52.0)
Hemoglobin: 16.8 g/dL (ref 13.0–17.0)
Immature Granulocytes: 0 %
Lymphocytes Relative: 21 %
Lymphs Abs: 1.9 10*3/uL (ref 0.7–4.0)
MCH: 32.1 pg (ref 26.0–34.0)
MCHC: 33.7 g/dL (ref 30.0–36.0)
MCV: 95 fL (ref 80.0–100.0)
Monocytes Absolute: 0.9 10*3/uL (ref 0.1–1.0)
Monocytes Relative: 10 %
Neutro Abs: 5.7 10*3/uL (ref 1.7–7.7)
Neutrophils Relative %: 65 %
Platelets: 240 10*3/uL (ref 150–400)
RBC: 5.24 MIL/uL (ref 4.22–5.81)
RDW: 13.3 % (ref 11.5–15.5)
WBC: 8.8 10*3/uL (ref 4.0–10.5)
nRBC: 0 % (ref 0.0–0.2)

## 2022-02-11 LAB — BASIC METABOLIC PANEL
Anion gap: 11 (ref 5–15)
BUN: 29 mg/dL — ABNORMAL HIGH (ref 8–23)
CO2: 26 mmol/L (ref 22–32)
Calcium: 9.6 mg/dL (ref 8.9–10.3)
Chloride: 104 mmol/L (ref 98–111)
Creatinine, Ser: 1.47 mg/dL — ABNORMAL HIGH (ref 0.61–1.24)
GFR, Estimated: 50 mL/min — ABNORMAL LOW (ref >60)
Glucose, Bld: 100 mg/dL — ABNORMAL HIGH (ref 70–99)
Potassium: 3.6 mmol/L (ref 3.5–5.1)
Sodium: 141 mmol/L (ref 135–145)

## 2022-02-11 MED ORDER — SULFAMETHOXAZOLE-TRIMETHOPRIM 800-160 MG PO TABS: 1.0000 | ORAL_TABLET | Freq: Two times a day (BID) | ORAL | 0 refills | Status: AC

## 2022-02-11 MED ORDER — IOHEXOL 300 MG/ML  SOLN
75.0000 mL | Freq: Once | INTRAMUSCULAR | Status: AC | PRN
  Administered 2022-02-11: 75 mL via INTRAVENOUS

## 2022-02-11 MED ORDER — AMOXICILLIN-POT CLAVULANATE 875-125 MG PO TABS
1.0000 | ORAL_TABLET | Freq: Once | ORAL | Status: AC
  Administered 2022-02-11: 1 via ORAL
  Filled 2022-02-11: qty 1

## 2022-02-11 MED ORDER — AMOXICILLIN-POT CLAVULANATE 875-125 MG PO TABS: 1.0000 | ORAL_TABLET | Freq: Two times a day (BID) | ORAL | 0 refills | Status: DC

## 2022-02-11 MED ORDER — HYDROCODONE-ACETAMINOPHEN 5-325 MG PO TABS
1.0000 | ORAL_TABLET | Freq: Once | ORAL | Status: DC
  Filled 2022-02-11: qty 1

## 2022-02-11 MED ORDER — SULFAMETHOXAZOLE-TRIMETHOPRIM 800-160 MG PO TABS
1.0000 | ORAL_TABLET | Freq: Once | ORAL | Status: AC
  Administered 2022-02-11: 1 via ORAL
  Filled 2022-02-11: qty 1

## 2022-02-11 MED ORDER — ACETAMINOPHEN 325 MG PO TABS
650.0000 mg | ORAL_TABLET | Freq: Once | ORAL | Status: AC
Start: 2022-02-11 — End: 2022-02-11
  Administered 2022-02-11: 650 mg via ORAL
  Filled 2022-02-11: qty 2

## 2022-02-11 NOTE — ED Notes (Signed)
Patient transported to Radiology at this Time. 

## 2022-02-11 NOTE — ED Notes (Signed)
Pt's left eyelid appears to be swollen. Pt states vision is fine.  ?

## 2022-02-11 NOTE — ED Provider Notes (Signed)
?[place] EMERGENCY DEPT ?Provider Note ? ? ?CSN: [id number] ?Arrival date & time: 02/11/22  1717 ? ?  ? ?History ? ?Chief Complaint  ?Patient presents with  ? Facial Pain  ? ? ?Jon Spencer is a 71 y.o. male. ? ?HPI ? ?71 year old male presenting to the emergency department with a headache since this past Friday with left-sided facial pain around the left eye and left forehead.  He has had some swelling around his left eye.  He denies any fevers or chills.  He denies any pain with extraocular movements.  He denies any vision loss.  He denies any rash or vesicular eruption along his face. ? ?Home Medications ?Prior to Admission medications   ?Medication Sig Start Date End Date Taking? Authorizing Provider  ?amoxicillin-clavulanate (AUGMENTIN) 875-125 MG tablet Take 1 tablet by mouth every 12 (twelve) hours. 02/11/22  Yes Regan Lemming, MD  ?sulfamethoxazole-trimethoprim (BACTRIM DS) 800-160 MG tablet Take 1 tablet by mouth 2 (two) times daily for 7 days. 02/11/22 02/18/22 Yes Regan Lemming, MD  ?amLODipine (NORVASC) 10 MG tablet TAKE 1 TABLET BY MOUTH EVERY DAY 11/13/21   Burchette, Alinda Sierras, MD  ?losartan (COZAAR) 100 MG tablet TAKE 1 TABLET BY MOUTH EVERY DAY 02/10/22   Burchette, Alinda Sierras, MD  ?mirabegron ER (MYRBETRIQ) 50 MG TB24 tablet Take 1 tablet (50 mg total) by mouth daily. 11/07/21   Burchette, Alinda Sierras, MD  ?Multiple Vitamins-Minerals (MULTIVITAMIN ADULT) CHEW Chew 1 each by mouth daily.    [provider]  ?predniSONE (DELTASONE) 10 MG tablet Taper as follows: 4-4-4-3-3-3-2-2-1-1 11/14/21   Burchette, Alinda Sierras, MD  ?sildenafil (REVATIO) 20 MG tablet TAKE 2-5 TABLETS BY MOUTH ONCE DAILY AS NEEDED FOR SEXUAL ACTIVITY 11/07/21   Burchette, Alinda Sierras, MD  ?VIAGRA 100 MG tablet Take 1 tablet (100 mg total) by mouth daily as needed for erectile dysfunction. 11/08/19   Burchette, Alinda Sierras, MD  ?   ? ?Allergies    ?Tolectin [tolmetin sodium], Meloxicam, and Univasc [moexipril hydrochloride]   ? ?Review  of Systems   ?Review of Systems  ?All other systems reviewed and are negative. ? ?Physical Exam ?Updated Vital Signs ?BP (!) 153/79   Pulse 75   Temp 97.7 ?F (36.5 ?C) (Tympanic)   Resp 16   Ht '5\' 8"'$  (1.727 m)   Wt 87.1 kg   SpO2 97%   BMI 29.19 kg/m?  ?Physical Exam ?Vitals and nursing note reviewed.  ?Constitutional:   ?   General: He is not in acute distress. ?HENT:  ?   Head: Normocephalic and atraumatic.  ?Eyes:  ?   General: Vision grossly intact. Gaze aligned appropriately.  ?   Extraocular Movements: Extraocular movements intact.  ?   Conjunctiva/sclera: Conjunctivae normal.  ?   Pupils: Pupils are equal, round, and reactive to light.  ?   Comments: Extraocular movements intact without pain or discomfort, vision grossly intact  ?Cardiovascular:  ?   Rate and Rhythm: Normal rate and regular rhythm.  ?Pulmonary:  ?   Effort: Pulmonary effort is normal. No respiratory distress.  ?Abdominal:  ?   General: There is no distension.  ?   Tenderness: There is no guarding.  ?Musculoskeletal:     ?   General: No deformity or signs of injury.  ?   Cervical back: Neck supple.  ?Skin: ?   Findings: No lesion or rash.  ?   Comments: Mild erythema along the left forehead periorbitally, no vesicular rash.  ?Neurological:  ?  General: No focal deficit present.  ?   Mental Status: He is alert. Mental status is at baseline.  ?   GCS: GCS eye subscore is 4. GCS verbal subscore is 5. GCS motor subscore is 6.  ?   Cranial Nerves: Cranial nerves 2-12 are intact.  ? ? ?ED Results / Procedures / Treatments   ?Labs ?(all labs ordered are listed, but only abnormal results are displayed) ?Labs Reviewed  ?BASIC METABOLIC PANEL - Abnormal; Notable for the following components:  ?    Result Value  ? Glucose, Bld 100 (*)   ? BUN 29 (*)   ? Creatinine, Ser 1.47 (*)   ? GFR, Estimated 50 (*)   ? All other components within normal limits  ?CBC WITH DIFFERENTIAL/PLATELET  ? ? ?EKG ?None ? ?Radiology ?CT Orbits W Contrast ? ?Result Date:  02/11/2022 ?CLINICAL DATA:  Left facial pain around the left eye for 4 days EXAM: CT ORBITS WITH CONTRAST TECHNIQUE: Multidetector CT images was performed according to the standard protocol following intravenous contrast administration. RADIATION DOSE REDUCTION: This exam was performed according to the departmental dose-optimization program which includes automated exposure control, adjustment of the mA and/or kV according to patient size and/or use of iterative reconstruction technique. CONTRAST:  101m OMNIPAQUE IOHEXOL 300 MG/ML  SOLN COMPARISON:  No pertinent prior exam. FINDINGS: Orbits: Right: The right globe is intact. Orbital fat is preserved. The extraocular muscles are normal. The optic nerve is normal Left: The globe is intact. Orbital fat is preserved. The extraocular muscles are normal. The optic nerves are normal. Visible paranasal sinuses: Paranasal sinuses are clear. Soft tissues: There is mild periorbital soft tissue swelling in the left without evidence of postseptal extension. Soft tissues are otherwise unremarkable. There is no organized or drainable fluid collection. Osseous: There is no acute osseous abnormality or aggressive osseous lesion. Limited intracranial: The imaged portions of the intracranial compartment are unremarkable. IMPRESSION: Mild left periorbital soft tissue swelling/cellulitis with no evidence of postseptal extension. Electronically Signed   By: PValetta MoleM.D.   On: 02/11/2022 20:43   ? ?Procedures ?Procedures  ? ? ?Medications Ordered in ED ?Medications  ?acetaminophen (TYLENOL) tablet 650 mg (650 mg Oral Given 02/11/22 1837)  ?iohexol (OMNIPAQUE) 300 MG/ML solution 75 mL (75 mLs Intravenous Contrast Given 02/11/22 2027)  ?amoxicillin-clavulanate (AUGMENTIN) 875-125 MG per tablet 1 tablet (1 tablet Oral Given 02/11/22 2119)  ?sulfamethoxazole-trimethoprim (BACTRIM DS) 800-160 MG per tablet 1 tablet (1 tablet Oral Given 02/11/22 2120)  ? ? ?ED Course/ Medical Decision Making/  A&P ?  ?                        ?Medical Decision Making ?Amount and/or Complexity of Data Reviewed ?Labs: ordered. ?Radiology: ordered. ? ?Risk ?OTC drugs. ?Prescription drug management. ? ? ?71year old male presenting to the emergency department with a headache since this past Friday with left-sided facial pain around the left eye and left forehead.  He has had some swelling around his left eye.  He denies any fevers or chills.  He denies any pain with extraocular movements.  He denies any vision loss.  He denies any rash or vesicular eruption along his face. ? ?On arrival, the patient was afebrile, otherwise vitally stable, sinus rhythm noted on cardiac telemetry.  Physical exam concerning for erythema along the left face/forehead periorbitally.  No proptosis or pain with extraocular movements to suggest orbital cellulitis.  Will obtain CT of the orbits to  further evaluate. ? ?Screening laboratory work-up significant for CBC without a leukocytosis or anemia, BMP with a creatinine at baseline. ? ?CT orbits concerning for periorbital cellulitis: ?IMPRESSION:  ?Mild left periorbital soft tissue swelling/cellulitis with no  ?evidence of postseptal extension.  ?   ? ? ?No evidence of orbital cellulitis on CT.  No evidence for Ramsay Hunt syndrome or zoster.  Pain is less of a burning in characterization.  Discussed with the patient diagnosis of likely cellulitis.  Discussed possibility of shingles although no clear vesicular rash at this time.  We will treat with outpatient antibiotics and have the patient follow-up with his PCP.  Return precautions provided. ? ? ?Final Clinical Impression(s) / ED Diagnoses ?Final diagnoses:  ?Periorbital cellulitis of left eye  ? ? ?Rx / DC Orders ?ED Discharge Orders   ? ?      Ordered  ?  sulfamethoxazole-trimethoprim (BACTRIM DS) 800-160 MG tablet  2 times daily       ? 02/11/22 2100  ?  amoxicillin-clavulanate (AUGMENTIN) 875-125 MG tablet  Every 12 hours       ? 02/11/22 2100   ? ?  ?  ? ?  ? ? ?  ?Regan Lemming, MD ?02/12/22 1603 ? ?

## 2022-02-11 NOTE — ED Triage Notes (Signed)
Headache since last Friday, resolved  ?Left sided facial pain around eye that started Sunday ?Left eye swelling noted ? ?

## 2022-02-11 NOTE — ED Notes (Signed)
Patient returned from Radiology. 

## 2022-02-11 NOTE — Discharge Instructions (Addendum)
You were evaluated in the Emergency Department and after careful evaluation, we did not find any emergent condition requiring admission or further testing in the hospital. ? ?Your exam/testing today was concerning for preseptal cellulitis.  We will place you on an antibiotic regimen and have you follow-up with your PCP.  If you develop a vesicular rash along your left forehead, this could be concerning for a shingles infection.  Given the finding of preseptal cellulitis, we will treat with antibiotics rather than treat with antivirals for shingles.  Take a probiotic and take your antibiotics with a full stomach to alleviate the potential for development of nausea. ? ?Please return to the Emergency Department if you experience any worsening of your condition.  Thank you for allowing [place] to be a part of your care. ? ?

## 2022-02-13 ENCOUNTER — Encounter (HOSPITAL_BASED_OUTPATIENT_CLINIC_OR_DEPARTMENT_OTHER): Payer: Self-pay | Admitting: Emergency Medicine

## 2022-02-13 ENCOUNTER — Other Ambulatory Visit: Payer: Self-pay

## 2022-02-13 ENCOUNTER — Emergency Department (HOSPITAL_BASED_OUTPATIENT_CLINIC_OR_DEPARTMENT_OTHER)
Admission: EM | Admit: 2022-02-13 | Discharge: 2022-02-13 | Disposition: A | Discharge status: Home / Self Care | Payer: Commercial Managed Care - PPO | Attending: Emergency Medicine | Admitting: Emergency Medicine

## 2022-02-13 DIAGNOSIS — B029 Zoster without complications: Secondary | ICD-10-CM | POA: Insufficient documentation

## 2022-02-13 DIAGNOSIS — B028 Zoster with other complications: Secondary | ICD-10-CM

## 2022-02-13 DIAGNOSIS — R519 Headache, unspecified: Secondary | ICD-10-CM | POA: Diagnosis present

## 2022-02-13 MED ORDER — VALACYCLOVIR HCL 500 MG PO TABS
1000.0000 mg | ORAL_TABLET | Freq: Once | ORAL | Status: AC
  Administered 2022-02-13: 1000 mg via ORAL
  Filled 2022-02-13: qty 2

## 2022-02-13 MED ORDER — HYDROCODONE-ACETAMINOPHEN 5-325 MG PO TABS
1.0000 | ORAL_TABLET | Freq: Once | ORAL | Status: AC
  Administered 2022-02-13: 1 via ORAL
  Filled 2022-02-13: qty 1

## 2022-02-13 MED ORDER — FLUORESCEIN SODIUM 1 MG OP STRP
1.0000 | ORAL_STRIP | Freq: Once | OPHTHALMIC | Status: AC
Start: 2022-02-13 — End: 2022-02-13
  Administered 2022-02-13: 1 via OPHTHALMIC
  Filled 2022-02-13: qty 1

## 2022-02-13 MED ORDER — TETRACAINE HCL 0.5 % OP SOLN
2.0000 [drp] | Freq: Once | OPHTHALMIC | Status: AC
Start: 2022-02-13 — End: 2022-02-13
  Administered 2022-02-13: 2 [drp] via OPHTHALMIC
  Filled 2022-02-13: qty 4

## 2022-02-13 NOTE — ED Provider Notes (Signed)
?[facility] EMERGENCY DEPT ?Provider Note ? ? ?CSN: [id number] ?Arrival date & time: 02/13/22  1026 ? ?  ? ?History ? ?Chief Complaint  ?Patient presents with  ? Eye Problem  ? ? ?ISAIAH CIANCI is a 71 y.o. male. ? ?HPI ?Patient reports that 8 days ago he woke up and had a painful headache on the left side of his head.  He illustrates the area was basically if he is laid his palm over his forehead and temple.  Initially thought it was because he sleeps with his face down and must of put pressure on his face.  He reports that it was very painful and aching.  He went through the weekend and then came to the emergency department 2 days ago.Marland Kitchen  He reports at that time he had uncomfortable swelling of his skin, he describes it as almost that have been crinkled like Saran wrap.  He was seen in the emergency department [phone].  At that time rash was subtle and he was evaluated including CT head.  At that time concern was periorbital cellulitis and he was started on Bactrim and Augmentin.  He reports since that time he has gotten much more scabbing and blistering rash on his face.  He reports that he is gotten a lot more swelling under the eye than was previous.  He denies that the eye itself hurts although he has a slightly gritty feeling in the medial aspect of the eye.  No fevers no chills.  No nausea no vomiting.  No neck stiffness.  No confusion. ?  ? ?Home Medications ?Prior to Admission medications   ?Medication Sig Start Date End Date Taking? Authorizing Provider  ?amLODipine (NORVASC) 10 MG tablet TAKE 1 TABLET BY MOUTH EVERY DAY 11/13/21   Burchette, Alinda Sierras, MD  ?amoxicillin-clavulanate (AUGMENTIN) 875-125 MG tablet Take 1 tablet by mouth every 12 (twelve) hours. 02/11/22   Regan Lemming, MD  ?losartan (COZAAR) 100 MG tablet TAKE 1 TABLET BY MOUTH EVERY DAY 02/10/22   Burchette, Alinda Sierras, MD  ?mirabegron ER (MYRBETRIQ) 50 MG TB24 tablet Take 1 tablet (50 mg total) by mouth daily. 11/07/21   Burchette, Alinda Sierras, MD  ?Multiple Vitamins-Minerals (MULTIVITAMIN ADULT) CHEW Chew 1 each by mouth daily.    [provider]  ?predniSONE (DELTASONE) 10 MG tablet Taper as follows: 4-4-4-3-3-3-2-2-1-1 11/14/21   Burchette, Alinda Sierras, MD  ?sildenafil (REVATIO) 20 MG tablet TAKE 2-5 TABLETS BY MOUTH ONCE DAILY AS NEEDED FOR SEXUAL ACTIVITY 11/07/21   Burchette, Alinda Sierras, MD  ?sulfamethoxazole-trimethoprim (BACTRIM DS) 800-160 MG tablet Take 1 tablet by mouth 2 (two) times daily for 7 days. 02/11/22 02/18/22  Regan Lemming, MD  ?VIAGRA 100 MG tablet Take 1 tablet (100 mg total) by mouth daily as needed for erectile dysfunction. 11/08/19   Burchette, Alinda Sierras, MD  ?   ? ?Allergies    ?Tolectin [tolmetin sodium], Meloxicam, and Univasc [moexipril hydrochloride]   ? ?Review of Systems   ?Review of Systems ?10 systems reviewed and negative except as per HPI ?Physical Exam ?Updated Vital Signs ?BP (!) 166/80 (BP Location: Right Arm)   Pulse 80   Temp 98.3 ?F (36.8 ?C) (Oral)   Resp 18   Ht '5\' 8"'$  (1.727 m)   Wt 87 kg   SpO2 92%   BMI 29.16 kg/m?  ?Physical Exam ?Constitutional:   ?   Comments: Alert nontoxic clear mental status  ?HENT:  ?   Nose: Nose normal.  ?  Mouth/Throat:  ?   Mouth: Mucous membranes are moist.  ?   Pharynx: Oropharynx is clear.  ?Eyes:  ?   Extraocular Movements: Extraocular movements intact.  ?   Pupils: Pupils are equal, round, and reactive to light.  ?   Comments: Left eye stained with fluorescein.  No uptake on the cornea.  ?Cardiovascular:  ?   Rate and Rhythm: Normal rate and regular rhythm.  ?Pulmonary:  ?   Effort: Pulmonary effort is normal.  ?   Breath sounds: Normal breath sounds.  ?Abdominal:  ?   General: There is no distension.  ?   Palpations: Abdomen is soft.  ?Musculoskeletal:     ?   General: Normal range of motion.  ?   Cervical back: Neck supple.  ?Skin: ?   General: Skin is warm and dry.  ?   Findings: Rash present.  ?   Comments: See attached images for facial rash that is in ophthalmic  nerve distribution.  ? ? ? ? ? ? ?ED Results / Procedures / Treatments   ?Labs ?(all labs ordered are listed, but only abnormal results are displayed) ?Labs Reviewed - No data to display ? ?EKG ?None ? ?Radiology ?CT Orbits W Contrast ? ?Result Date: 02/11/2022 ?CLINICAL DATA:  Left facial pain around the left eye for 4 days EXAM: CT ORBITS WITH CONTRAST TECHNIQUE: Multidetector CT images was performed according to the standard protocol following intravenous contrast administration. RADIATION DOSE REDUCTION: This exam was performed according to the departmental dose-optimization program which includes automated exposure control, adjustment of the mA and/or kV according to patient size and/or use of iterative reconstruction technique. CONTRAST:  23m OMNIPAQUE IOHEXOL 300 MG/ML  SOLN COMPARISON:  No pertinent prior exam. FINDINGS: Orbits: Right: The right globe is intact. Orbital fat is preserved. The extraocular muscles are normal. The optic nerve is normal Left: The globe is intact. Orbital fat is preserved. The extraocular muscles are normal. The optic nerves are normal. Visible paranasal sinuses: Paranasal sinuses are clear. Soft tissues: There is mild periorbital soft tissue swelling in the left without evidence of postseptal extension. Soft tissues are otherwise unremarkable. There is no organized or drainable fluid collection. Osseous: There is no acute osseous abnormality or aggressive osseous lesion. Limited intracranial: The imaged portions of the intracranial compartment are unremarkable. IMPRESSION: Mild left periorbital soft tissue swelling/cellulitis with no evidence of postseptal extension. Electronically Signed   By: PValetta MoleM.D.   On: 02/11/2022 20:43   ? ?Procedures ?Procedures  ? ? ?Medications Ordered in ED ?Medications  ?HYDROcodone-acetaminophen (NORCO/VICODIN) 5-325 MG per tablet 1 tablet (1 tablet Oral Given 02/13/22 1234)  ?valACYclovir (VALTREX) tablet 1,000 mg (1,000 mg Oral Given  02/13/22 1234)  ?fluorescein ophthalmic strip 1 strip (1 strip Left Eye Given 02/13/22 1235)  ?tetracaine (PONTOCAINE) 0.5 % ophthalmic solution 2 drop (2 drops Left Eye Given 02/13/22 1235)  ? ? ?ED Course/ Medical Decision Making/ A&P ?  ?                        ?Medical Decision Making ?Risk ?Prescription drug management. ? ? ?Patient presents with worsening facial rash and swelling around his eye.  He had been seen previously and evaluated with CT head and started on antibiotic therapy. ? ?Physical exam is consistent with shingles.  Ophthalmic nerve distribution.  Eye exam does not reveal any lesions on the cornea by fluorescein staining.  Extraocular motions are normal. ? ?Patient does not  have any neck stiffness, confusion or signs of encephalitis.  He does have focal headache right in the area of the rash but not generalized headache or any signs of encephalopathy ? ?At this time no further diagnostic imaging indicated. ? ?Consult: Reviewed with Dr. Tobe Sos ophthalmology.  Request patient be sent directly to ophthalmology office for dilated exam from the emergency department.  He advises they will do all needed prescriptions and no prescriptions need to be made through the emergency department. ? ?Patient was given 1 dose of valacyclovir in the emergency department and a dose of Vicodin.  He got improved pain control with Vicodin.  He is discharged in good condition with his wife present for review of all planning and information.  At this time they are going directly to the [facility] office ? ? ? ? ? ? ? ?Final Clinical Impression(s) / ED Diagnoses ?Final diagnoses:  ?Herpes zoster with other complication  ? ? ?Rx / DC Orders ?ED Discharge Orders   ? ? None  ? ?  ? ? ?  ?Charlesetta Shanks, MD ?02/13/22 1352 ? ?

## 2022-02-13 NOTE — Discharge Instructions (Addendum)
Go directly to [facility] from the emergency department. ?

## 2022-02-13 NOTE — ED Triage Notes (Signed)
Pt states he was recently seen here for left eye infection, where he had labs and CT done. Pt was sent home on abx and told to return if it worsened. Pt noticed some drainage last night and more swelling to lower lid today.  ?

## 2022-02-18 NOTE — Progress Notes (Deleted)
ACUTE VISIT No chief complaint on file.  HPI: [name] is a 71 y.o. male, who is here today complaining of *** HPI  Review of Systems Rest see pertinent positives and negatives per HPI.  Current Outpatient Medications on File Prior to Visit  Medication Sig Dispense Refill   amLODipine (NORVASC) 10 MG tablet TAKE 1 TABLET BY MOUTH EVERY DAY 90 tablet 1   amoxicillin-clavulanate (AUGMENTIN) 875-125 MG tablet Take 1 tablet by mouth every 12 (twelve) hours. 14 tablet 0   losartan (COZAAR) 100 MG tablet TAKE 1 TABLET BY MOUTH EVERY DAY 90 tablet 0   mirabegron ER (MYRBETRIQ) 50 MG TB24 tablet Take 1 tablet (50 mg total) by mouth daily. 30 tablet 5   Multiple Vitamins-Minerals (MULTIVITAMIN ADULT) CHEW Chew 1 each by mouth daily.     predniSONE (DELTASONE) 10 MG tablet Taper as follows: 4-4-4-3-3-3-2-2-1-1 28 tablet 0   sildenafil (REVATIO) 20 MG tablet TAKE 2-5 TABLETS BY MOUTH ONCE DAILY AS NEEDED FOR SEXUAL ACTIVITY 100 tablet 1   sulfamethoxazole-trimethoprim (BACTRIM DS) 800-160 MG tablet Take 1 tablet by mouth 2 (two) times daily for 7 days. 14 tablet 0   VIAGRA 100 MG tablet Take 1 tablet (100 mg total) by mouth daily as needed for erectile dysfunction. 6 tablet 11   No current facility-administered medications on file prior to visit.     Past Medical History:  Diagnosis Date   ADD 0/07/3817   Complication of anesthesia    takes awhile to wake up   History of back surgery 07/2018   Tumor removal   History of kidney stones    pt. 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/01/2010   OSTEOARTHRITIS, HAND 04/17/2010   TINNITUS 06/26/2009   TRIGGER FINGER 01/18/2009   Allergies  Allergen Reactions   Tolectin [Tolmetin Sodium] Other (See Comments)    Fever 104, breathing problems   Meloxicam Other (See Comments)    REACTION: Flushed, high temp   Univasc [Moexipril Hydrochloride] Cough     coughing    Social History   Socioeconomic History   Marital status: Married    Spouse name: Not on file   Number of children: Not on file   Years of education: Not on file   Highest education level: Not on file  Occupational History   Not on file  Tobacco Use   Smoking status: Never   Smokeless tobacco: Never  Vaping Use   Vaping Use: Never used  Substance and Sexual Activity   Alcohol use: Yes    Alcohol/week: 1.0 - 4.0 standard drink    Types: 1 - 4 Glasses of wine per week   Drug use: Never   Sexual activity: Not on file  Other Topics Concern   Not on file  Social History Narrative   Not on file   Social Determinants of Health   Financial Resource Strain: Not on file  Food Insecurity: Not on file  Transportation Needs: Not on file  Physical Activity: Not on file  Stress: Not on file  Social Connections: Not on file    There were no vitals filed for this visit. There is no height or weight on file to calculate BMI.  Physical Exam  ASSESSMENT AND PLAN:  There are no diagnoses linked to this encounter.   No follow-ups on file.   Betty G. Martinique, MD  [facility]. [place] office.  Discharge Instructions   None

## 2022-02-20 ENCOUNTER — Ambulatory Visit: Payer: Managed Care, Other (non HMO) | Admitting: Family Medicine

## 2022-02-24 ENCOUNTER — Telehealth: Payer: Self-pay | Admitting: Family Medicine

## 2022-02-24 ENCOUNTER — Ambulatory Visit (INDEPENDENT_AMBULATORY_CARE_PROVIDER_SITE_OTHER): Payer: Commercial Managed Care - PPO | Admitting: Family Medicine

## 2022-02-24 VITALS — BP 136/72 | HR 72 | Temp 98.6°F | Wt 198.9 lb

## 2022-02-24 DIAGNOSIS — B0230 Zoster ocular disease, unspecified: Secondary | ICD-10-CM

## 2022-02-24 MED ORDER — GABAPENTIN 300 MG PO CAPS: 300.0000 mg | ORAL_CAPSULE | Freq: Two times a day (BID) | ORAL | 3 refills | Status: DC

## 2022-02-24 NOTE — Telephone Encounter (Signed)
Pt is calling and does not want to see another provider. Pt is in pain and thinks he has shingles on the side of his head . Pt did go to er for this on 02-13-2022 also had eye infection. Please advise. Pt would like to be seen today ?

## 2022-02-24 NOTE — Progress Notes (Signed)
? ?  Established Patient Office Visit ? ?Subjective   ?Patient ID: Jon Spencer, male    DOB: 10-19-1951  Age: 71 y.o. MRN: [id number] ? ?Chief Complaint  ?Patient presents with  ? Hospitalization Follow-up  ?  Has shingles  ? ? ?HPI ? ? ? ?Seen for follow-up regarding shingles involving left side of face around the eye.  He had been on a business trip and developed some swelling and pain around the left eye.  He was seen in the ER on April 12.  Was initially thought to have periorbital cellulitis.  CT orbits with contrast revealed mild left periorbital soft tissue swelling with no evidence for postseptal extension.  He was placed on antibiotics with Augmentin and Bactrim DS.  His symptoms worsen and he returned to the ER on the 14th.  By this time he had some vesicles noted.  He was started on Valtrex 1 g 3 times daily and sent straight to ophthalmology.  Apparently started on Viroptic.  Has had close follow-up with ophthalmology and thankfully no visual deficits. ? ?Overall, swelling has improved some but he does have 8 out of 10 pain which is a burning quality.  Interfering with sleep.  Not relieved with Tylenol. ?Review of Systems  ?Constitutional:  Negative for chills and fever.  ?Eyes:  Negative for blurred vision.  ?Neurological:  Negative for headaches.  ? ?  ?Objective:  ?  ? ?BP 136/72 (BP Location: Left Arm, Patient Position: Sitting, Cuff Size: Normal)   Pulse 72   Temp 98.6 ?F (37 ?C) (Oral)   Wt 198 lb 14.4 oz (90.2 kg)   SpO2 95%   BMI 30.24 kg/m?  ? ? ?Physical Exam ?Vitals reviewed.  ?Eyes:  ?   Extraocular Movements: Extraocular movements intact.  ?Pulmonary:  ?   Effort: Pulmonary effort is normal.  ?   Breath sounds: Normal breath sounds.  ?Skin: ?   Comments: Multiple small eschars left scalp region to the midline extending down to the left forehead.  ?Neurological:  ?   Mental Status: He is alert.  ? ? ? ?No results found for any visits on 02/24/22. ? ? ? ?The ASCVD Risk score (Arnett DK,  et al., 2019) failed to calculate for the following reasons: ?  Cannot find a previous HDL lab ?  Cannot find a previous total cholesterol lab ? ?  ?Assessment & Plan:  ? ?Shingles involving left scalp and left periorbital involvement.  He is followed currently by ophthalmology.  No visual deficits.  Moderate pain 8 out of 10 in severity and interfering with sleep. ? ?-We discussed starting gabapentin 300 mg nightly.  He is aware of the potential side effects such as lightheadedness, dizziness, sedation. ?-May increase to 300 mg up to 3 times daily but caution about sedation with things like driving. ?-Be in touch if not getting adequate relief with the gabapentin. ? ? ? ?No follow-ups on file.  ? ? ?[name], MD ? ?

## 2022-03-20 ENCOUNTER — Other Ambulatory Visit: Payer: Self-pay | Admitting: Family Medicine

## 2022-05-10 ENCOUNTER — Other Ambulatory Visit: Payer: Self-pay | Admitting: Family Medicine

## 2022-05-22 ENCOUNTER — Ambulatory Visit: Payer: Commercial Managed Care - PPO | Admitting: Family Medicine

## 2022-05-27 ENCOUNTER — Ambulatory Visit (INDEPENDENT_AMBULATORY_CARE_PROVIDER_SITE_OTHER): Payer: Commercial Managed Care - PPO | Admitting: Family Medicine

## 2022-05-27 ENCOUNTER — Encounter: Payer: Self-pay | Admitting: Family Medicine

## 2022-05-27 VITALS — BP 160/86 | HR 70 | Temp 98.0°F | Ht 68.0 in | Wt 200.3 lb

## 2022-05-27 DIAGNOSIS — B0229 Other postherpetic nervous system involvement: Secondary | ICD-10-CM | POA: Diagnosis not present

## 2022-05-27 DIAGNOSIS — I10 Essential (primary) hypertension: Secondary | ICD-10-CM

## 2022-05-27 MED ORDER — DULOXETINE HCL 30 MG PO CPEP: ORAL_CAPSULE | ORAL | 3 refills | Status: DC

## 2022-05-27 NOTE — Progress Notes (Signed)
Established Patient Office Visit  Subjective   Patient ID: Jon Spencer, male    DOB: [birth year]  Age: 71 y.o. MRN: [id number]  Chief Complaint  Patient presents with   Follow-up    HPI   Jon Spencer had severe case of shingles back in April.  His skin rash is fully healed and left eye symptoms have improved.  He had recent ophthalmology appointment.  He has still some postherpetic neuropathy pains especially just above the left eye.  He states this is about 5 out of 10 in severity at its worst.  Advil usually helps.  He has taking gabapentin 300 mg at night which definitely helps but unfortunately has some hangover drowsiness the next day which has limited use especially days that he has to work.  He is interested in other potential options for treating the pain that would not be as sedating.  He has hypertension treated with losartan 100 mg daily and amlodipine 10 mg daily.  Blood pressure generally better controlled at home.  Up slightly today.  Increased stress recently.  Wife has been battling with cancer and is currently in the ER being evaluated for possible pneumonia.  Past Medical History:  Diagnosis Date   ADD 02/08/8118   Complication of anesthesia    takes awhile to wake up   History of back surgery 07/2018   Tumor removal   History of kidney stones    pt. 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/01/2010   OSTEOARTHRITIS, HAND 04/17/2010   TINNITUS 06/26/2009   TRIGGER FINGER 01/18/2009   Past Surgical History:  Procedure Laterality Date   APPENDECTOMY  1969   COLONOSCOPY     LAMINECTOMY N/A 07/12/2018   Procedure: Lumbar three-four Laminectomy for resection of intradural tumor;  Surgeon: [name], MD;  Location: [place];  Service: Neurosurgery;  Laterality: N/A;   VASECTOMY  1992    reports that he has never smoked. He has never used smokeless tobacco. He reports current alcohol use of about 1.0 -  4.0 standard drink of alcohol per week. He reports that he does not use drugs. family history includes Arthritis in an other family member; Hyperlipidemia in an other family member; Hypertension in an other family member; Sudden death in an other family member. Allergies  Allergen Reactions   Tolectin [Tolmetin Sodium] Other (See Comments)    Fever 104, breathing problems   Meloxicam Other (See Comments)    REACTION: Flushed, high temp   Univasc [Moexipril Hydrochloride] Cough    coughing    Review of Systems  Constitutional:  Negative for chills and fever.  Eyes:  Negative for blurred vision.  Respiratory:  Negative for cough and shortness of breath.   Cardiovascular:  Negative for chest pain.  Neurological:  Negative for headaches.      Objective:     BP (!) 166/86   Pulse 70   Temp 98 F (36.7 C) (Oral)   Ht '5\' 8"'$  (1.727 m)   Wt 200 lb 4.8 oz (90.9 kg)   SpO2 97%   BMI 30.46 kg/m  BP Readings from Last 3 Encounters:  05/27/22 (!) 160/86  02/24/22 136/72  02/13/22 (!) 166/80   Wt Readings from Last 3 Encounters:  05/27/22 200 lb 4.8 oz (90.9 kg)  02/24/22 198 lb 14.4 oz (90.2 kg)  02/13/22 191 lb 12.8 oz (87 kg)      Physical Exam Vitals  reviewed.  Constitutional:      General: He is not in acute distress.    Appearance: He is well-developed. He is not ill-appearing.  Eyes:     Pupils: Pupils are equal, round, and reactive to light.  Neck:     Thyroid: No thyromegaly.  Cardiovascular:     Rate and Rhythm: Normal rate and regular rhythm.  Pulmonary:     Effort: Pulmonary effort is normal. No respiratory distress.     Breath sounds: Normal breath sounds. No wheezing or rales.  Musculoskeletal:     Cervical back: Neck supple.  Skin:    Findings: No rash.  Neurological:     Mental Status: He is alert and oriented to person, place, and time.      No results found for any visits on 05/27/22.    The ASCVD Risk score (Arnett DK, et al., 2019) failed  to calculate for the following reasons:   Cannot find a previous HDL lab   Cannot find a previous total cholesterol lab    Assessment & Plan:   #1 postherpetic neuralgia following shingles back in April.  His pain is mostly above the left eye (forehead) region.  He does get improvement with gabapentin but has drowsiness the next day which limits use for work days. -We discussed the fact that other neuropathy medications such as Lyrica and TCAs would likewise cause potentially sedation -We did discuss possible use of Cymbalta which should cause hopefully less sedation. -He is undecided regarding whether he would like to use this but if he does would start 30 mg once daily for 1 week and if tolerating well then increase to 2 tablets daily and give feedback in 3 to 4 weeks  #2 hypertension.  Elevated today.  Generally much better controlled.  Increase situational stressors.  Also recently taking some Advil which could be exacerbating. -Keep sodium intake down -Continue losartan 100 mg daily and amlodipine 10 mg daily -Monitor closely at home and be in touch if consistently greater than 527 systolic over the next few weeks  No follow-ups on file.    [name], MD

## 2022-06-10 ENCOUNTER — Other Ambulatory Visit: Payer: Self-pay | Admitting: Family Medicine

## 2022-07-19 ENCOUNTER — Other Ambulatory Visit: Payer: Self-pay | Admitting: Family Medicine

## 2022-08-09 ENCOUNTER — Other Ambulatory Visit: Payer: Self-pay | Admitting: Family Medicine

## 2022-08-26 ENCOUNTER — Encounter: Payer: Commercial Managed Care - PPO | Admitting: Family Medicine

## 2022-09-08 ENCOUNTER — Ambulatory Visit (INDEPENDENT_AMBULATORY_CARE_PROVIDER_SITE_OTHER): Payer: Commercial Managed Care - PPO | Admitting: Family Medicine

## 2022-09-08 ENCOUNTER — Encounter: Payer: Self-pay | Admitting: Family Medicine

## 2022-09-08 VITALS — BP 128/60 | HR 80 | Temp 97.5°F | Ht 68.0 in | Wt 196.5 lb

## 2022-09-08 DIAGNOSIS — I340 Nonrheumatic mitral (valve) insufficiency: Secondary | ICD-10-CM

## 2022-09-08 DIAGNOSIS — R42 Dizziness and giddiness: Secondary | ICD-10-CM

## 2022-09-08 NOTE — Progress Notes (Signed)
Established Patient Office Visit  Subjective   Patient ID: Jon Spencer, male    DOB: [birth year]  Age: 71 y.o. MRN: [id number]  Chief Complaint  Patient presents with   Weakness   Dizziness   Blood Sugar Problem    HPI   Jon Spencer is seen with some dizziness which she first noticed Saturday when he got up around his usual time of 5:15 AM.  He felt somewhat off balance with walking.  There is no syncope.  No lightheadedness.  He went back to bed for about an hour and then he got up he still has some balance issues but overall gradually improving since then.  Denies any headache, chest pain, dyspnea, speech changes, dysphagia, focal weakness, visual changes, or any other focal neurologic symptom.  He states about 10 years ago he had similar type symptoms which eventually resolved.  He describes sensation of disequilibrium.  He does have hypertension treated with losartan and amlodipine.  Denies any orthostatic symptoms.  He initially thought this may be a "blood sugar problem "but does not take any diabetic medications.  Has not had any documented hypoglycemia.  Past Medical History:  Diagnosis Date   ADD 02/08/8118   Complication of anesthesia    takes awhile to wake up   History of back surgery 07/2018   Tumor removal   History of kidney stones    pt. 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/01/2010   OSTEOARTHRITIS, HAND 04/17/2010   TINNITUS 06/26/2009   TRIGGER FINGER 01/18/2009   Past Surgical History:  Procedure Laterality Date   APPENDECTOMY  1969   COLONOSCOPY     LAMINECTOMY N/A 07/12/2018   Procedure: Lumbar three-four Laminectomy for resection of intradural tumor;  Surgeon: [name], MD;  Location: [place];  Service: Neurosurgery;  Laterality: N/A;   VASECTOMY  1992    reports that he has never smoked. He has never used smokeless tobacco. He reports current alcohol use of about 1.0 - 4.0 standard  drink of alcohol per week. He reports that he does not use drugs. family history includes Arthritis in an other family member; Hyperlipidemia in an other family member; Hypertension in an other family member; Sudden death in an other family member. Allergies  Allergen Reactions   Tolectin [Tolmetin Sodium] Other (See Comments)    Fever 104, breathing problems   Meloxicam Other (See Comments)    REACTION: Flushed, high temp   Univasc [Moexipril Hydrochloride] Cough    coughing    Review of Systems  Constitutional:  Negative for chills and fever.  Eyes:  Negative for blurred vision and double vision.  Cardiovascular:  Negative for chest pain and orthopnea.  Gastrointestinal:  Negative for abdominal pain.  Genitourinary:  Negative for dysuria.  Neurological:  Positive for dizziness. Negative for tingling, speech change, focal weakness, seizures, loss of consciousness and headaches.      Objective:     BP 128/60 (BP Location: Left Arm, Patient Position: Sitting, Cuff Size: Large)   Pulse 80   Temp (!) 97.5 F (36.4 C) (Oral)   Ht '5\' 8"'$  (1.727 m)   Wt 196 lb 8 oz (89.1 kg)   SpO2 97%   BMI 29.88 kg/m  BP Readings from Last 3 Encounters:  09/08/22 128/60  05/27/22 (!) 160/86  02/24/22 136/72   Wt Readings from Last 3 Encounters:  09/08/22 196 lb 8 oz (89.1 kg)  05/27/22 200 lb 4.8 oz (90.9 kg)  02/24/22 198 lb 14.4 oz (90.2 kg)      Physical Exam Vitals reviewed.  Constitutional:      Appearance: Normal appearance.  Neck:     Comments: No carotid bruits Cardiovascular:     Rate and Rhythm: Normal rate and regular rhythm.     Comments: Faint murmur over the mitral valve region 1 out of 6 systolic.  Slightly more prominent with exhalation Musculoskeletal:     Cervical back: Neck supple.     Right lower leg: No edema.     Left lower leg: No edema.  Neurological:     General: No focal deficit present.     Mental Status: He is alert and oriented to person, place, and  time.     Cranial Nerves: No cranial nerve deficit.     Motor: No weakness.     Coordination: Coordination normal.     Gait: Gait normal.      No results found for any visits on 09/08/22.    The ASCVD Risk score (Arnett DK, et al., 2019) failed to calculate for the following reasons:   Cannot find a previous HDL lab   Cannot find a previous total cholesterol lab    Assessment & Plan:   #1 recent dizziness.  This sounds more like disequilibrium.  He is not describe any orthostatic type symptoms.  Nonfocal neuro exam.  No ataxia.  Cannot reproduce any vertigo symptoms with change of position from sitting to supine with head in various positions.  Symptoms are improved at this time. -Recommend close observation at this time -Follow-up immediately for any recurrent symptoms or new symptoms -We discussed more common presenting symptoms for TIA/CVA -Set up complete physical  #2 faint systolic murmur mitral region.  Doubt related to symptoms above.  We did discuss possible echocardiogram.  He would like to set up physical in the next couple weeks and will reauscultated then make a decision regarding further evaluation.  He does not describe any exertional dizziness or dyspnea-no recent chest pain.  No follow-ups on file.    [name], MD

## 2022-09-08 NOTE — Patient Instructions (Signed)
Set up complete physical soon.   

## 2022-10-19 ENCOUNTER — Other Ambulatory Visit: Payer: Self-pay | Admitting: Family Medicine

## 2022-11-09 ENCOUNTER — Other Ambulatory Visit: Payer: Self-pay | Admitting: Family Medicine

## 2022-12-03 ENCOUNTER — Other Ambulatory Visit: Payer: Self-pay | Admitting: Family Medicine

## 2023-02-09 ENCOUNTER — Telehealth: Payer: Self-pay | Admitting: Family Medicine

## 2023-02-09 NOTE — Telephone Encounter (Signed)
Left a message for the patient to return my call.  

## 2023-02-09 NOTE — Telephone Encounter (Signed)
50 mg of MYRBETRIQ 50 MG TB24 tablet every evening around 7,  still has frequent urination throughout the night. Seeking guidance on what needs to be done to reduce his restroom visits throughout the night

## 2023-02-09 NOTE — Telephone Encounter (Signed)
Patient has been scheduled for visit to discuss

## 2023-02-12 ENCOUNTER — Ambulatory Visit: Payer: Commercial Managed Care - PPO | Admitting: Adult Health

## 2023-02-27 ENCOUNTER — Other Ambulatory Visit: Payer: Self-pay | Admitting: Family Medicine

## 2023-03-15 ENCOUNTER — Ambulatory Visit: Payer: Commercial Managed Care - PPO | Admitting: Internal Medicine

## 2023-03-18 ENCOUNTER — Encounter: Payer: Self-pay | Admitting: Internal Medicine

## 2023-03-18 ENCOUNTER — Ambulatory Visit (INDEPENDENT_AMBULATORY_CARE_PROVIDER_SITE_OTHER): Payer: Commercial Managed Care - PPO | Admitting: Internal Medicine

## 2023-03-18 VITALS — BP 130/70 | HR 70 | Temp 98.3°F | Wt 197.8 lb

## 2023-03-18 DIAGNOSIS — E782 Mixed hyperlipidemia: Secondary | ICD-10-CM

## 2023-03-18 DIAGNOSIS — R0609 Other forms of dyspnea: Secondary | ICD-10-CM

## 2023-03-18 DIAGNOSIS — I10 Essential (primary) hypertension: Secondary | ICD-10-CM

## 2023-03-18 DIAGNOSIS — R351 Nocturia: Secondary | ICD-10-CM | POA: Diagnosis not present

## 2023-03-18 DIAGNOSIS — R0602 Shortness of breath: Secondary | ICD-10-CM

## 2023-03-18 LAB — POC URINALSYSI DIPSTICK (AUTOMATED)
Bilirubin, UA: NEGATIVE
Blood, UA: NEGATIVE
Glucose, UA: NEGATIVE
Ketones, UA: NEGATIVE
Leukocytes, UA: NEGATIVE
Nitrite, UA: NEGATIVE
Protein, UA: POSITIVE — AB
Spec Grav, UA: 1.025 (ref 1.010–1.025)
Urobilinogen, UA: 0.2 E.U./dL
pH, UA: 6 (ref 5.0–8.0)

## 2023-03-18 NOTE — Progress Notes (Signed)
Established Patient Office Visit     CC/Reason for Visit: Discuss acute concerns  HPI: Jon Spencer is a 72 y.o. male who is coming in today for the above mentioned reasons. Past Medical History is significant for: Hypertension, hyperlipidemia among other issues.  He is usually followed by Dr. Caryl Never.  He comes in today with a clear 6-month history of shortness of breath on exertion.  His house sits on a basement a lot and so he has to frequently climb up the hill to go to the mailbox or tend to his yard.  In the past 6 months he has noticed significant difficulty with this, becoming progressively more short of breath.  He does not have any chest pain or any other symptoms with this.  He has a long history of nocturia, he has been prescribed Myrbetriq which has not really been helpful.  He feels nocturia is getting worse.   Past Medical/Surgical History: Past Medical History:  Diagnosis Date   ADD 01/17/2010   Complication of anesthesia    takes awhile to wake up   History of back surgery 07/2018   Tumor removal   History of kidney stones    pt. 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/01/2010   OSTEOARTHRITIS, HAND 04/17/2010   TINNITUS 06/26/2009   TRIGGER FINGER 01/18/2009    Past Surgical History:  Procedure Laterality Date   APPENDECTOMY  1969   COLONOSCOPY     LAMINECTOMY N/A 07/12/2018   Procedure: Lumbar three-four Laminectomy for resection of intradural tumor;  Surgeon: Barnett Abu, MD;  Location: [facility];  Service: Neurosurgery;  Laterality: N/A;   VASECTOMY  1992    Social History:  reports that he has never smoked. He has never used smokeless tobacco. He reports current alcohol use of about 1.0 - 4.0 standard drink of alcohol per week. He reports that he does not use drugs.  Allergies: Allergies  Allergen Reactions   Tolectin [Tolmetin Sodium] Other (See Comments)    Fever 104,  breathing problems   Meloxicam Other (See Comments)    REACTION: Flushed, high temp   Univasc [Moexipril Hydrochloride] Cough    coughing    Family History:  Family History  Problem Relation Age of Onset   Arthritis Other    Hyperlipidemia Other    Hypertension Other    Sudden death Other      Current Outpatient Medications:    amLODipine (NORVASC) 10 MG tablet, TAKE 1 TABLET BY MOUTH EVERY DAY, Disp: 90 tablet, Rfl: 1   DULoxetine (CYMBALTA) 30 MG capsule, Take one tablet by mouth once daily for one week and then increase to two tablets by mouth once daily., Disp: 60 capsule, Rfl: 3   imiquimod (ALDARA) 5 % cream, Apply topically daily., Disp: , Rfl:    losartan (COZAAR) 100 MG tablet, TAKE 1 TABLET BY MOUTH EVERY DAY, Disp: 90 tablet, Rfl: 0   Multiple Vitamins-Minerals (MULTIVITAMIN ADULT) CHEW, Chew 1 each by mouth daily., Disp: , Rfl:    MYRBETRIQ 50 MG TB24 tablet, TAKE 1 TABLET BY MOUTH EVERY DAY, Disp: 30 tablet, Rfl: 5   sildenafil (REVATIO) 20 MG tablet, TAKE 2-5 TABLETS BY MOUTH ONCE DAILY AS NEEDED FOR SEXUAL ACTIVITY, Disp: 100 tablet, Rfl: 1   VIAGRA 100 MG tablet, Take 1 tablet (100 mg total) by mouth daily as needed for erectile dysfunction., Disp: 6 tablet, Rfl: 11  Review of Systems:  Negative unless indicated in HPI.   Physical Exam: Vitals:   03/18/23 0926  BP: 130/70  Pulse: 70  Temp: 98.3 F (36.8 C)  TempSrc: Oral  SpO2: 98%  Weight: 197 lb 12.8 oz (89.7 kg)    Body mass index is 30.08 kg/m.   Physical Exam Vitals reviewed.  Constitutional:      Appearance: Normal appearance.  HENT:     Head: Normocephalic and atraumatic.  Eyes:     Conjunctiva/sclera: Conjunctivae normal.     Pupils: Pupils are equal, round, and reactive to light.  Cardiovascular:     Rate and Rhythm: Normal rate and regular rhythm.     Heart sounds: Murmur heard.  Pulmonary:     Effort: Pulmonary effort is normal.     Breath sounds: Normal breath sounds.  Skin:     General: Skin is warm and dry.  Neurological:     General: No focal deficit present.     Mental Status: He is alert and oriented to person, place, and time.  Psychiatric:        Mood and Affect: Mood normal.        Behavior: Behavior normal.        Thought Content: Thought content normal.        Judgment: Judgment normal.      Impression and Plan:  DOE (dyspnea on exertion) -     Ambulatory referral to Cardiology -     ECHOCARDIOGRAM COMPLETE; Future  Nocturia -     POCT Urinalysis Dipstick (Automated) -     Ambulatory referral to Urology  Shortness of breath -     EKG 12-Lead  Mixed hyperlipidemia  Essential hypertension   -Concerned that his dyspnea on exertion could be a CAD equivalent.  He has many CAD risk factors: Age, gender, obesity, hypertension, hyperlipidemia.  Will order 2D echo (he does have a systolic murmur), place urgent cardiology referral. -EKG done in office today and interpreted as: Sinus rhythm with a rate around 72, appears to be a left anterior fascicular block and some nonspecific T wave changes -Suspect his nocturia is related to BPH which is why Myrbetriq is not really helping.  He has tried Flomax in the past which was helpful for the nocturia but unfortunately caused significant issues with erectile function.  Will place urology referral.   Time spent:32 minutes spent on today's visit reviewing chart, interviewing and examining patient and formulating plan of care.  This time was independent of time spent analyzing EKG.  We discussed appropriate referrals and further workup for symptoms.     Chaya Jan, MD [facility]

## 2023-03-23 ENCOUNTER — Ambulatory Visit (INDEPENDENT_AMBULATORY_CARE_PROVIDER_SITE_OTHER): Payer: Commercial Managed Care - PPO

## 2023-03-23 DIAGNOSIS — R0609 Other forms of dyspnea: Secondary | ICD-10-CM

## 2023-03-23 LAB — ECHOCARDIOGRAM COMPLETE
AR max vel: 1.17 cm2
AV Area VTI: 1.33 cm2
AV Area mean vel: 1.17 cm2
AV Mean grad: 21 mmHg
AV Peak grad: 39.4 mmHg
Ao pk vel: 3.14 m/s
Area-P 1/2: 3.53 cm2
Calc EF: 57.1 %
S' Lateral: 2.05 cm
Single Plane A2C EF: 54.5 %
Single Plane A4C EF: 57 %

## 2023-04-01 ENCOUNTER — Other Ambulatory Visit: Payer: Self-pay | Admitting: Family Medicine

## 2023-05-04 NOTE — Progress Notes (Unsigned)
Cardiology Office Note   Date:  05/05/2023   ID:  Jon Spencer, DOB 11/12/1950, MRN [id number]  PCP:  Jon Covey, MD  Cardiologist:   None Referring:  Philip Aspen, [place]*  Chief Complaint  Patient presents with   Heart Murmur      History of Present Illness: Jon Spencer is a 72 y.o. male who presents for evaluation of SOB.   He has no significant past cardiac history.  He told his primary provider that he had some fatigue or may be decreased exercise tolerance pushing a wheelbarrow up a hill.  He thought he recovered quickly from this.  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  He can do activities however without getting any chest pressure, neck or arm discomfort.  He is not having any resting shortness of breath, PND or orthopnea.  He had no palpitations, presyncope or syncope.  He had no past cardiac history except has been told he had a murmur.  Echo in May demonstrated a NL LV function.  He did have moderate aortic stenosis.   Past Medical History:  Diagnosis Date   ADD 01/17/2010   Complication of anesthesia    takes awhile to wake up   History of back surgery 07/2018   Tumor removal   History of kidney stones    pt. 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/01/2010   OSTEOARTHRITIS, HAND 04/17/2010   TINNITUS 06/26/2009   TRIGGER FINGER 01/18/2009    Past Surgical History:  Procedure Laterality Date   APPENDECTOMY  1969   COLONOSCOPY     LAMINECTOMY N/A 07/12/2018   Procedure: Lumbar three-four Laminectomy for resection of intradural tumor;  Surgeon: Barnett Abu, MD;  Location: [facility] OR;  Service: Neurosurgery;  Laterality: N/A;   VASECTOMY  1992     Current Outpatient Medications  Medication Sig Dispense Refill   amLODipine (NORVASC) 10 MG tablet TAKE 1 TABLET BY MOUTH EVERY DAY 90 tablet 1   DULoxetine (CYMBALTA) 30 MG capsule Take one  tablet by mouth once daily for one week and then increase to two tablets by mouth once daily. 60 capsule 3   imiquimod (ALDARA) 5 % cream Apply topically daily.     losartan (COZAAR) 100 MG tablet TAKE 1 TABLET BY MOUTH EVERY DAY 90 tablet 0   Multiple Vitamins-Minerals (MULTIVITAMIN ADULT) CHEW Chew 1 each by mouth daily.     MYRBETRIQ 50 MG TB24 tablet TAKE 1 TABLET BY MOUTH EVERY DAY 30 tablet 5   VIAGRA 100 MG tablet Take 1 tablet (100 mg total) by mouth daily as needed for erectile dysfunction. 6 tablet 11   No current facility-administered medications for this visit.    Allergies:   Tolectin [tolmetin sodium], Meloxicam, and Univasc [moexipril hydrochloride]    Social History:  The patient  reports that he has never smoked. He has never used smokeless tobacco. He reports current alcohol use of about 1.0 - 4.0 standard drink of alcohol per week. He reports that he does not use drugs.   Family History:  The patient's family history includes Arthritis in an other family member; Bladder Cancer in his father; Hyperlipidemia in an other family member; Hypertension in an other family member; Stroke in his mother and sister; Sudden death in an other family member.    ROS:  Please see the history of  present illness.   Otherwise, review of systems are positive for none.   All other systems are reviewed and negative.    PHYSICAL EXAM: VS:  BP (!) 146/76   Pulse 68   Ht 5\' 8"  (1.727 m)   Wt 200 lb (90.7 kg)   SpO2 98%   BMI 30.41 kg/m  , BMI Body mass index is 30.41 kg/m. GENERAL:  Well appearing HEENT:  Pupils equal round and reactive, fundi not visualized, oral mucosa unremarkable NECK:  No jugular venous distention, waveform within normal limits, carotid upstroke brisk and symmetric, no bruits, no thyromegaly LYMPHATICS:  No cervical, inguinal adenopathy LUNGS:  Clear to auscultation bilaterally BACK:  No CVA tenderness CHEST:  Unremarkable HEART:  PMI not displaced or sustained,S1  and S2 within normal limits, no S3, no S4, no clicks, no rubs, 3 out of 6 apical systolic murmur radiating slightly at the aortic outflow tract, no diastolic murmurs ABD:  Flat, positive bowel sounds normal in frequency in pitch, no bruits, no rebound, no guarding, no midline pulsatile mass, no hepatomegaly, no splenomegaly EXT:  2 plus pulses throughout, no edema, no cyanosis no clubbing SKIN:  No rashes no nodules NEURO:  Cranial nerves II through XII grossly intact, motor grossly intact throughout PSYCH:  Cognitively intact, oriented to person place and time    EKG: Sinus rhythm, rate 73, left axis deviation, left anterior fascicular block, poor anterior R wave progression, T wave inversion in the lateral leads.  The T wave inversions are new.    Recent Labs: No results found for requested labs within last 365 days.    Lipid Panel    Component Value Date/Time   CHOL 226 (H) 05/18/2017 1141   TRIG 134.0 05/18/2017 1141   HDL 44.50 05/18/2017 1141   CHOLHDL 5 05/18/2017 1141   VLDL 26.8 05/18/2017 1141   LDLCALC 155 (H) 05/18/2017 1141   LDLDIRECT 162.6 09/26/2013 0853      Wt Readings from Last 3 Encounters:  05/05/23 200 lb (90.7 kg)  03/18/23 197 lb 12.8 oz (89.7 kg)  09/08/22 196 lb 8 oz (89.1 kg)      Other studies Reviewed: Additional studies/ records that were reviewed today include: Echo, labs. Review of the above records demonstrates:  Please see elsewhere in the note.     ASSESSMENT AND PLAN:  SOB:   This is very minor.  I am can check a BNP level given the aortic stenosis.  He is going to start exercising on his elliptical at home and if his breathing gets worse I did further evaluation.  I suspect this is somewhat related to a little bit of weight and deconditioning.  Murmur: He has moderate aortic stenosis.  I will follow this clinically and I will repeat an echo in May of next year.  Hypertension: His blood pressure is elevated and he is going to keep a  blood pressure diary.  He might need further manage.  Risk reduction: I will check a lipid profile.  He has no recall of history of dyslipidemia.  As listed.   Current medicines are reviewed at length with the patient today.  The patient does not have concerns regarding medicines.  The following changes have been made:  no change  Labs/ tests ordered today include:   Orders Placed This Encounter  Procedures   Lipid panel   Pro b natriuretic peptide (BNP)[id number])   ECHOCARDIOGRAM COMPLETE     Disposition:   FU with  me in one year.     Signed, Rollene Rotunda, MD  05/05/2023 11:05 AM    [facility]

## 2023-05-05 ENCOUNTER — Ambulatory Visit: Payer: Commercial Managed Care - PPO | Attending: Cardiology | Admitting: Cardiology

## 2023-05-05 ENCOUNTER — Encounter: Payer: Self-pay | Admitting: Cardiology

## 2023-05-05 VITALS — BP 146/76 | HR 68 | Ht 68.0 in | Wt 200.0 lb

## 2023-05-05 DIAGNOSIS — R0602 Shortness of breath: Secondary | ICD-10-CM | POA: Diagnosis not present

## 2023-05-05 DIAGNOSIS — I350 Nonrheumatic aortic (valve) stenosis: Secondary | ICD-10-CM

## 2023-05-05 DIAGNOSIS — E782 Mixed hyperlipidemia: Secondary | ICD-10-CM

## 2023-05-05 NOTE — Patient Instructions (Signed)
   Lab Work:  Your physician recommends that you return for lab work FASTING  If you have labs (blood work) drawn today and your tests are completely normal, you will receive your results only by: MyChart Message (if you have MyChart) OR A paper copy in the mail If you have any lab test that is abnormal or we need to change your treatment, we will call you to review the results.   Testing/Procedures:  Your physician has requested that you have an echocardiogram. Echocardiography is a painless test that uses sound waves to create images of your heart. It provides your doctor with information about the size and shape of your heart and how well your heart's chambers and valves are working. This procedure takes approximately one hour. There are no restrictions for this procedure. Please do NOT wear cologne, perfume, aftershave, or lotions (deodorant is allowed). Please arrive 15 minutes prior to your appointment time. [address]-SCHEDULE IN MAY 2025   Follow-Up: At [facility], you and your health needs are our priority.  As part of our continuing mission to provide you with exceptional heart care, we have created designated Provider Care Teams.  These Care Teams include your primary Cardiologist (physician) and Advanced Practice Providers (APPs -  Physician Assistants and Nurse Practitioners) who all work together to provide you with the care you need, when you need it.  We recommend signing up for the patient portal called "MyChart".  Sign up information is provided on this After Visit Summary.  MyChart is used to connect with patients for Virtual Visits (Telemedicine).  Patients are able to view lab/test results, encounter notes, upcoming appointments, etc.  Non-urgent messages can be sent to your provider as well.   To learn more about what you can do with MyChart, go to [email or web address].    Your next appointment:   12 month(s)  Provider:   Rollene Rotunda MD    Other Instructions  TRACK BLOOD PRESSURE TWICE DAILY FOR 2 WEEKS ABOUT 2 HOURS AFTER TAKING YOUR MEDICATIONS

## 2023-05-06 ENCOUNTER — Other Ambulatory Visit: Payer: Self-pay | Admitting: Family Medicine

## 2023-05-18 ENCOUNTER — Encounter: Payer: Self-pay | Admitting: Family Medicine

## 2023-05-18 ENCOUNTER — Ambulatory Visit: Payer: Commercial Managed Care - PPO | Admitting: Family Medicine

## 2023-05-18 VITALS — BP 146/60 | HR 75 | Temp 97.9°F | Ht 68.0 in | Wt 200.7 lb

## 2023-05-18 DIAGNOSIS — M5416 Radiculopathy, lumbar region: Secondary | ICD-10-CM

## 2023-05-18 MED ORDER — PREDNISONE 10 MG PO TABS: ORAL_TABLET | ORAL | 0 refills | Status: DC

## 2023-05-18 NOTE — Progress Notes (Signed)
Established Patient Office Visit  Subjective   Patient ID: Jon Spencer, male    DOB: 04-20-1951  Age: 72 y.o. MRN: [id number]  Chief Complaint  Patient presents with   Back Pain    Patient complains of back pain, x5 months, Tried Advil     HPI   Jon Spencer is seen with several month history of some pain right lower lumbar area with radiation to the buttock and occasionally mildly down to the foot or ankle.  He states back in 1975 he was repelling off a building and fell and sustained coccyx fracture.  He had some chronic intermittent pain since then.  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  He underwent surgery and has done reasonably well until recently.  Has been using some Aleve for his pain.  Question of mild intermittent weakness.  Pain is dull and moderate to severe intensity at times and sometimes sharp.  No urine or stool incontinence.  No fever.  Appetite and weight stable.  Past Medical History:  Diagnosis Date   ADD 01/17/2010   Complication of anesthesia    takes awhile to wake up   History of back surgery 07/2018   Tumor removal   History of kidney stones    pt. 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/01/2010   OSTEOARTHRITIS, HAND 04/17/2010   TINNITUS 06/26/2009   TRIGGER FINGER 01/18/2009   Past Surgical History:  Procedure Laterality Date   APPENDECTOMY  1969   COLONOSCOPY     LAMINECTOMY N/A 07/12/2018   Procedure: Lumbar three-four Laminectomy for resection of intradural tumor;  Surgeon: Barnett Abu, MD;  Location: [facility] OR;  Service: Neurosurgery;  Laterality: N/A;   VASECTOMY  1992    reports that he has never smoked. He has never used smokeless tobacco. He reports current alcohol use of about 1.0 - 4.0 standard drink of alcohol per week. He reports that he does not use drugs. family history includes Arthritis in an other family  member; Bladder Cancer in his father; Hyperlipidemia in an other family member; Hypertension in an other family member; Stroke in his mother and sister; Sudden death in an other family member. Allergies  Allergen Reactions   Tolectin [Tolmetin Sodium] Other (See Comments)    Fever 104, breathing problems   Meloxicam Other (See Comments)    REACTION: Flushed, high temp   Univasc [Moexipril Hydrochloride] Cough    coughing    Review of Systems  Constitutional:  Negative for chills, fever and weight loss.  Genitourinary:  Negative for dysuria.  Musculoskeletal:  Positive for back pain.  Neurological:  Negative for focal weakness.      Objective:     BP (!) 146/60 (BP Location: Left Arm, Patient Position: Sitting, Cuff Size: Large)   Pulse 75   Temp 97.9 F (36.6 C) (Oral)   Ht 5\' 8"  (1.727 m)   Wt 200 lb 11.2 oz (91 kg)   SpO2 97%   BMI 30.52 kg/m  BP Readings from Last 3 Encounters:  05/18/23 (!) 146/60  05/05/23 (!) 146/76  03/18/23 130/70   Wt Readings from Last 3 Encounters:  05/18/23 200 lb 11.2 oz (91 kg)  05/05/23 200 lb (90.7 kg)  03/18/23 197 lb 12.8 oz (89.7 kg)      Physical Exam Vitals reviewed.  Constitutional:  Appearance: Normal appearance.  Cardiovascular:     Rate and Rhythm: Normal rate and regular rhythm.  Pulmonary:     Effort: Pulmonary effort is normal.     Breath sounds: Normal breath sounds.  Musculoskeletal:     Comments: Straight leg raises are negative  Neurological:     Mental Status: He is alert.     Comments: 2+ reflexes knee and ankle bilaterally.  He has full strength with plantarflexion, dorsiflexion, knee extension bilaterally      No results found for any visits on 05/18/23.    The ASCVD Risk score (Arnett DK, et al., 2019) failed to calculate for the following reasons:   Cannot find a previous HDL lab   Cannot find a previous total cholesterol lab    Assessment & Plan:   Several month history of right lumbar  back pain with sciatica type features.  Nonfocal neuroexam.  Very little relief with Aleve.  We discussed trial of prednisone taper.  Touch base if not improving over the next couple weeks.  May need MRI to further assess.  May supplement with Tylenol as needed   Evelena Peat, MD

## 2023-07-17 ENCOUNTER — Other Ambulatory Visit: Payer: Self-pay | Admitting: Family Medicine

## 2023-07-19 ENCOUNTER — Encounter: Payer: Self-pay | Admitting: *Deleted

## 2023-08-06 ENCOUNTER — Other Ambulatory Visit: Payer: Self-pay | Admitting: Family Medicine

## 2023-08-06 DIAGNOSIS — Z1211 Encounter for screening for malignant neoplasm of colon: Secondary | ICD-10-CM

## 2023-08-06 DIAGNOSIS — Z1212 Encounter for screening for malignant neoplasm of rectum: Secondary | ICD-10-CM

## 2023-09-11 ENCOUNTER — Other Ambulatory Visit: Payer: Self-pay | Admitting: Family Medicine

## 2023-10-15 ENCOUNTER — Other Ambulatory Visit: Payer: Self-pay | Admitting: Family Medicine

## 2023-10-22 ENCOUNTER — Other Ambulatory Visit: Payer: Self-pay | Admitting: Family Medicine

## 2023-10-28 ENCOUNTER — Telehealth: Payer: Self-pay

## 2023-10-28 NOTE — Telephone Encounter (Signed)
Copied from CRM [id number]. Topic: Clinical - Medication Question >> Oct 28, 2023  2:26 PM Almira Coaster wrote: Reason for CRM: Patient would like to speak with Dr.Burchette's nurse regarding a testosterone injection.

## 2023-11-01 NOTE — Telephone Encounter (Signed)
I spoke with the patient and nothing further is needed at this

## 2023-11-25 ENCOUNTER — Ambulatory Visit: Payer: Self-pay | Admitting: Family Medicine

## 2023-11-25 ENCOUNTER — Ambulatory Visit: Payer: Commercial Managed Care - PPO | Admitting: Family Medicine

## 2023-11-25 ENCOUNTER — Encounter: Payer: Self-pay | Admitting: Family Medicine

## 2023-11-25 VITALS — BP 132/62 | HR 94 | Temp 99.1°F | Ht 68.0 in | Wt 211.1 lb

## 2023-11-25 DIAGNOSIS — R051 Acute cough: Secondary | ICD-10-CM

## 2023-11-25 DIAGNOSIS — R0689 Other abnormalities of breathing: Secondary | ICD-10-CM

## 2023-11-25 MED ORDER — BENZONATATE 200 MG PO CAPS
200.0000 mg | ORAL_CAPSULE | Freq: Three times a day (TID) | ORAL | 0 refills | Status: DC | PRN
Start: 2023-11-25 — End: 2024-05-12

## 2023-11-25 MED ORDER — DOXYCYCLINE HYCLATE 100 MG PO TABS: 100.0000 mg | ORAL_TABLET | Freq: Two times a day (BID) | ORAL | 0 refills | Status: DC

## 2023-11-25 MED ORDER — ALBUTEROL SULFATE HFA 108 (90 BASE) MCG/ACT IN AERS: 2.0000 | INHALATION_SPRAY | Freq: Four times a day (QID) | RESPIRATORY_TRACT | 0 refills | Status: DC | PRN

## 2023-11-25 NOTE — Progress Notes (Signed)
   Subjective:    Patient ID: MALHAR MONTEMARANO, male    DOB: Dec 12, 1950, 73 y.o.   MRN: [id number]  HPI URI- pt first noticed chest congestion on Tuesday.  'it was pretty significant'.  Started taking Tylenol regularly.  Yesterday developed cough- non productive.  No fevers.  Denies body aches/chills.  + mild HA this morning.  Denies sinus pain/pressure.  L ear fullness.  No known sick contacts.   Review of Systems For ROS see HPI     Objective:   Physical Exam Vitals reviewed.  Constitutional:      General: He is not in acute distress.    Appearance: He is well-developed. He is not ill-appearing.  HENT:     Head: Normocephalic and atraumatic.     Right Ear: Tympanic membrane and ear canal normal.     Left Ear: Tympanic membrane and ear canal normal.     Nose: Congestion present.     Comments: No TTP over frontal or maxillary sinuses Eyes:     General: No scleral icterus.    Extraocular Movements: Extraocular movements intact.  Cardiovascular:     Rate and Rhythm: Normal rate and regular rhythm.  Pulmonary:     Effort: Pulmonary effort is normal. No respiratory distress.     Breath sounds: Wheezing (faint inspiratory wheezes, R>L) and rhonchi (coarse BS over R side, normal on L) present.  Musculoskeletal:     Cervical back: Neck supple.  Lymphadenopathy:     Cervical: No cervical adenopathy.  Skin:    General: Skin is warm and dry.  Neurological:     Mental Status: He is alert and oriented to person, place, and time.  Psychiatric:        Mood and Affect: Mood normal.        Behavior: Behavior normal.        Thought Content: Thought content normal.           Assessment & Plan:  Cough w/ abnormal breath sounds- new.  Given coarse BS on R and + wheezing, concern for bronchitis or early PNA.  Will start Doxycycline BID.  Cough meds prn.  Albuterol to improve chest tightness and wheezing.  Reviewed supportive care and red flags that should prompt return.  Pt expressed  understanding and is in agreement w/ plan.

## 2023-11-25 NOTE — Telephone Encounter (Signed)
Copied from CRM [id number]. Topic: Clinical - Red Word Triage >> Nov 25, 2023  8:10 AM Deaijah H wrote: Red Word that prompted transfer to Nurse Triage: Severe cold tightness in chest and everything coming from nose/with headache  The patient reported an ongoing cough, chest pain that feels like muscle pain due to coughing, sneezing and a runny nose with clear discharge.  He stated that he has started feeling worse and has a trip to [place] Sunday and appointments scheduled tomorrow.  He was scheduled for a same day appointment at a different office due to no availability at his normal office.   Reason for Disposition  [1] Continuous (nonstop) coughing interferes with work or school AND [2] no improvement using cough treatment per Care Advice  Answer Assessment - Initial Assessment Questions 1. ONSET: "When did the cough begin?"      Tuesday - worsened yesterday; trip Sunday  2. SEVERITY: "How bad is the cough today?"      Had to sit up to sleep  3. SPUTUM: "Describe the color of your sputum" (none, dry cough; clear, white, yellow, green)     Dry  4. HEMOPTYSIS: "Are you coughing up any blood?" If so ask: "How much?" (flecks, streaks, tablespoons, etc.)     None  5. DIFFICULTY BREATHING: "Are you having difficulty breathing?" If Yes, ask: "How bad is it?" (e.g., mild, moderate, severe)    - MILD: No SOB at rest, mild SOB with walking, speaks normally in sentences, can lie down, no retractions, pulse < 100.    - MODERATE: SOB at rest, SOB with minimal exertion and prefers to sit, cannot lie down flat, speaks in phrases, mild retractions, audible wheezing, pulse 100-120.    - SEVERE: Very SOB at rest, speaks in single words, struggling to breathe, sitting hunched forward, retractions, pulse > 120      None  6. FEVER: "Do you have a fever?" If Yes, ask: "What is your temperature, how was it measured, and when did it start?"     No 7. CARDIAC HISTORY: "Do you have any history of heart disease?" (e.g.,  heart attack, congestive heart failure)      hypertension 8. LUNG HISTORY: "Do you have any history of lung disease?"  (e.g., pulmonary embolus, asthma, emphysema)     none 10. OTHER SYMPTOMS: "Do you have any other symptoms?" (e.g., runny nose, wheezing, chest pain)       Runny nose - clear Chest pain with cough  Protocols used: Cough - Acute Non-Productive-A-AH

## 2023-11-25 NOTE — Patient Instructions (Signed)
Follow up as needed or as scheduled START the Doxycycline twice daily- take w/ food- for any bronchitis or early pneumonia USE the cough pills as needed- this can be in addition to Robitussin or Delsym USE the Albuterol inhaler- 2 puffs every 4 hrs as needed for cough/wheezing/shortness of breath Drink LOTS of fluids REST! Call with any questions or concerns Hang in there!

## 2023-12-10 ENCOUNTER — Encounter: Payer: Self-pay | Admitting: Family Medicine

## 2023-12-10 ENCOUNTER — Ambulatory Visit: Payer: Commercial Managed Care - PPO | Admitting: Family Medicine

## 2023-12-10 VITALS — BP 142/70 | HR 85 | Temp 98.2°F | Wt 209.4 lb

## 2023-12-10 DIAGNOSIS — M5412 Radiculopathy, cervical region: Secondary | ICD-10-CM

## 2023-12-10 NOTE — Progress Notes (Signed)
 Established Patient Office Visit  Subjective   Patient ID: ERROL ALA, male    DOB: Feb 10, 1951  Age: 73 y.o. MRN: [id number]  Chief Complaint  Patient presents with   Neck Pain    Patient complains of right sided neck pain, x2 months, Tried Advil   Shoulder Pain    Patient complains of right shoulder pain, x2 months    HPI   Ebubechukwu is seen with several month history of progressive right neck pains with radiation down right upper extremity mostly developed mid arm but occasionally down to the hand.  Pain has been progressive.  He states he has had 2 months of clearly worsening pain although he is had pain altogether for much much longer.  Denies any recent specific injury.  He had a cervical spine films 2021 which showed multilevel degenerative disc disease.  He is taken Advil without much relief.  Current pain is dull and 6-7 out of 10 in severity.  Denies any upper extremity numbness or weakness.  He has noted that pain tends to be a lot worse when he sleeps on his left side.  Pain triggered with moving the neck with extreme flexion or lateral bending or rotation to the right or left.  Starting to affect driving more.  No chest pain.  No prior history of neck surgery.  He is mostly right-handed though somewhat ambidextrous.  Past Medical History:  Diagnosis Date   ADD 01/17/2010   Complication of anesthesia    takes awhile to wake up   History of back surgery 07/2018   Tumor removal   History of kidney stones    pt. 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/01/2010   OSTEOARTHRITIS, HAND 04/17/2010   TINNITUS 06/26/2009   TRIGGER FINGER 01/18/2009   Past Surgical History:  Procedure Laterality Date   APPENDECTOMY  1969   COLONOSCOPY     LAMINECTOMY N/A 07/12/2018   Procedure: Lumbar three-four Laminectomy for resection of intradural tumor;  Surgeon: Colon Shove, MD;  Location: [facility] OR;  Service:  Neurosurgery;  Laterality: N/A;   VASECTOMY  1992    reports that he has never smoked. He has never used smokeless tobacco. He reports current alcohol use of about 1.0 - 4.0 standard drink of alcohol per week. He reports that he does not use drugs. family history includes Arthritis in an other family member; Bladder Cancer in his father; Hyperlipidemia in an other family member; Hypertension in an other family member; Stroke in his mother and sister; Sudden death in an other family member. Allergies  Allergen Reactions   Tolectin [Tolmetin Sodium] Other (See Comments)    Fever 104, breathing problems   Meloxicam Other (See Comments)    REACTION: Flushed, high temp   Univasc [Moexipril Hydrochloride] Cough    coughing    Review of Systems  Constitutional:  Negative for chills and fever.  Respiratory:  Negative for cough and shortness of breath.   Cardiovascular:  Negative for chest pain.  Musculoskeletal:  Positive for neck pain.  Neurological:  Negative for focal weakness.      Objective:     BP (!) 142/70 (BP Location: Left Arm, Patient Position: Sitting, Cuff Size: Normal)   Pulse 85   Temp 98.2 F (36.8 C) (Oral)   Wt 209 lb 6.4 oz (95 kg)   SpO2 97%   BMI 31.84 kg/m  BP Readings from Last 3  Encounters:  12/10/23 (!) 142/70  11/25/23 132/62  05/18/23 (!) 146/60   Wt Readings from Last 3 Encounters:  12/10/23 209 lb 6.4 oz (95 kg)  11/25/23 211 lb 2 oz (95.8 kg)  05/18/23 200 lb 11.2 oz (91 kg)      Physical Exam Vitals reviewed.  Constitutional:      General: He is not in acute distress.    Appearance: He is not ill-appearing.  Neck:     Comments: He has very limited range of motion with neck extension, lateral bending, or rotation to the right or left.  Movement in any of those dimensions triggers worsening neck pain on the right side near the right cervical base Cardiovascular:     Rate and Rhythm: Normal rate and regular rhythm.  Pulmonary:     Effort:  Pulmonary effort is normal.     Breath sounds: Normal breath sounds.  Musculoskeletal:     Cervical back: Neck supple.  Lymphadenopathy:     Cervical: No cervical adenopathy.  Neurological:     Mental Status: He is alert.     Sensory: No sensory deficit.     Comments: Good strength upper extremity.  He does have slightly diminished brachial radialis and biceps reflex compared to the left      No results found for any visits on 12/10/23.    The ASCVD Risk score (Arnett DK, et al., 2019) failed to calculate for the following reasons:   Cannot find a previous HDL lab   Cannot find a previous total cholesterol lab    Assessment & Plan:   Patient presents with progressive several month history of right lower cervical neck pain with radiation down right upper extremity.  He does not have any definite weakness on exam but does have diminished reflexes biceps and brachial radialis.  Pain is becoming progressively worse and interfering more with day-to-day activities and sleeping.  Previous cervical plain films showed multilevel degenerative arthritis.  Set up MRI scan to further assess Wolm Scarlet, MD

## 2023-12-10 NOTE — Patient Instructions (Signed)
 I will be setting up MRI to further assess neck pain.

## 2023-12-16 ENCOUNTER — Ambulatory Visit
Admission: RE | Admit: 2023-12-16 | Discharge: 2023-12-16 | Disposition: A | Discharge status: Home / Self Care | Payer: Commercial Managed Care - PPO | Source: Ambulatory Visit | Attending: Family Medicine | Admitting: Family Medicine

## 2023-12-16 DIAGNOSIS — M5412 Radiculopathy, cervical region: Secondary | ICD-10-CM

## 2023-12-20 ENCOUNTER — Telehealth: Payer: Self-pay

## 2023-12-20 NOTE — Telephone Encounter (Signed)
 Copied from CRM [id number]. Topic: Clinical - Lab/Test Results >> Dec 20, 2023  9:25 AM Irine Seal wrote: Reason for CRM: MR Cervical Spine Wo Contrast (Accession [id number]) (Order [id number]) Patient is wanting a call back to discuss the results of his MRI. Patient callback [phone]- call back after 11am

## 2023-12-20 NOTE — Telephone Encounter (Signed)
 Patient informed of message below.

## 2023-12-23 ENCOUNTER — Telehealth: Payer: Self-pay

## 2023-12-23 NOTE — Telephone Encounter (Signed)
 Copied from CRM [id number]. Topic: Clinical - Lab/Test Results >> Dec 22, 2023  2:50 PM Elizebeth Brooking wrote: Reason for CRM: Patient is calling in wanting to know the results of his MRI, he is requesting that someone give him a callback as soon as possible about this matter

## 2023-12-24 ENCOUNTER — Other Ambulatory Visit: Payer: Commercial Managed Care - PPO

## 2023-12-24 NOTE — Telephone Encounter (Signed)
 I spoke with Gabe at the reading room and order as been placed STAT

## 2023-12-24 NOTE — Addendum Note (Signed)
 Addended by: Kristian Covey on: 12/24/2023 03:27 PM   Modules accepted: Orders

## 2024-01-17 ENCOUNTER — Other Ambulatory Visit: Payer: Self-pay | Admitting: Family Medicine

## 2024-03-07 ENCOUNTER — Ambulatory Visit (HOSPITAL_COMMUNITY): Payer: Commercial Managed Care - PPO

## 2024-03-23 ENCOUNTER — Encounter: Payer: Self-pay | Admitting: Cardiology

## 2024-03-30 ENCOUNTER — Other Ambulatory Visit: Payer: Self-pay | Admitting: Family Medicine

## 2024-04-06 ENCOUNTER — Other Ambulatory Visit: Payer: Self-pay | Admitting: Family Medicine

## 2024-04-06 NOTE — Telephone Encounter (Unsigned)
 Copied from CRM [id number]. Topic: Clinical - Medication Refill >> Apr 06, 2024  1:53 PM Shardie S wrote: Medication: VIAGRA  100 MG tablet  Has the patient contacted their pharmacy? No (Agent: If no, request that the patient contact the pharmacy for the refill. If patient does not wish to contact the pharmacy document the reason why and proceed with request.) (Agent: If yes, when and what did the pharmacy advise?)  This is the patient's preferred pharmacy:  [id number] - [place], [place] - [address] [place] [address] Phone: [phone] Fax: [phone]   Is this the correct pharmacy for this prescription? Yes If no, delete pharmacy and type the correct one.   Has the prescription been filled recently? No  Is the patient out of the medication? Yes  Has the patient been seen for an appointment in the last year OR does the patient have an upcoming appointment? Yes  Can we respond through MyChart? No  Agent: Please be advised that Rx refills may take up to 3 business days. We ask that you follow-up with your pharmacy.

## 2024-04-07 MED ORDER — VIAGRA 100 MG PO TABS: 100.0000 mg | ORAL_TABLET | Freq: Every day | ORAL | 3 refills | Status: DC | PRN

## 2024-04-11 ENCOUNTER — Other Ambulatory Visit (HOSPITAL_COMMUNITY): Payer: Self-pay

## 2024-04-13 ENCOUNTER — Telehealth: Payer: Self-pay

## 2024-04-13 ENCOUNTER — Other Ambulatory Visit (HOSPITAL_COMMUNITY): Payer: Self-pay

## 2024-04-13 NOTE — Telephone Encounter (Signed)
 Brand name Viagra  or the generic Sildenafil  is not covered under part D. The Sildenafil  he can get at any [facility] for $10. The Rx was sent over as a DAW1. If you want to change to generic a new rx will need to be sent in.

## 2024-04-14 ENCOUNTER — Other Ambulatory Visit (HOSPITAL_BASED_OUTPATIENT_CLINIC_OR_DEPARTMENT_OTHER): Payer: Self-pay

## 2024-04-14 MED ORDER — SILDENAFIL CITRATE 100 MG PO TABS
100.0000 mg | ORAL_TABLET | Freq: Every day | ORAL | 3 refills | Status: AC | PRN
  Filled 2024-04-14: qty 6, 30d supply, fill #0
  Filled 2024-05-31: qty 6, 30d supply, fill #1

## 2024-04-14 NOTE — Telephone Encounter (Signed)
 Patient ok with change and rx sent

## 2024-04-19 DIAGNOSIS — M4712 Other spondylosis with myelopathy, cervical region: Secondary | ICD-10-CM | POA: Diagnosis not present

## 2024-04-19 DIAGNOSIS — Z6829 Body mass index (BMI) 29.0-29.9, adult: Secondary | ICD-10-CM | POA: Diagnosis not present

## 2024-05-03 ENCOUNTER — Ambulatory Visit (HOSPITAL_COMMUNITY): Payer: Self-pay | Attending: Cardiology

## 2024-05-03 ENCOUNTER — Encounter (HOSPITAL_COMMUNITY): Payer: Self-pay | Admitting: Cardiology

## 2024-05-06 ENCOUNTER — Other Ambulatory Visit: Payer: Self-pay | Admitting: Family Medicine

## 2024-05-09 ENCOUNTER — Other Ambulatory Visit: Payer: Self-pay | Admitting: Family Medicine

## 2024-05-12 ENCOUNTER — Emergency Department (HOSPITAL_BASED_OUTPATIENT_CLINIC_OR_DEPARTMENT_OTHER)

## 2024-05-12 ENCOUNTER — Encounter (HOSPITAL_BASED_OUTPATIENT_CLINIC_OR_DEPARTMENT_OTHER): Payer: Self-pay | Admitting: Emergency Medicine

## 2024-05-12 ENCOUNTER — Other Ambulatory Visit: Payer: Self-pay

## 2024-05-12 ENCOUNTER — Ambulatory Visit: Payer: Self-pay

## 2024-05-12 ENCOUNTER — Inpatient Hospital Stay (HOSPITAL_BASED_OUTPATIENT_CLINIC_OR_DEPARTMENT_OTHER)
Admission: EM | Admit: 2024-05-12 | Discharge: 2024-05-14 | DRG: 063 | Disposition: A | Discharge status: Home / Self Care | Attending: Neurology | Admitting: Neurology

## 2024-05-12 DIAGNOSIS — B948 Sequelae of other specified infectious and parasitic diseases: Secondary | ICD-10-CM

## 2024-05-12 DIAGNOSIS — R1310 Dysphagia, unspecified: Secondary | ICD-10-CM | POA: Diagnosis not present

## 2024-05-12 DIAGNOSIS — Z823 Family history of stroke: Secondary | ICD-10-CM

## 2024-05-12 DIAGNOSIS — R29701 NIHSS score 1: Secondary | ICD-10-CM | POA: Diagnosis present

## 2024-05-12 DIAGNOSIS — Z886 Allergy status to analgesic agent status: Secondary | ICD-10-CM

## 2024-05-12 DIAGNOSIS — R200 Anesthesia of skin: Secondary | ICD-10-CM | POA: Diagnosis not present

## 2024-05-12 DIAGNOSIS — Z87442 Personal history of urinary calculi: Secondary | ICD-10-CM | POA: Diagnosis not present

## 2024-05-12 DIAGNOSIS — I639 Cerebral infarction, unspecified: Principal | ICD-10-CM | POA: Diagnosis present

## 2024-05-12 DIAGNOSIS — I7090 Unspecified atherosclerosis: Secondary | ICD-10-CM | POA: Diagnosis not present

## 2024-05-12 DIAGNOSIS — I6782 Cerebral ischemia: Secondary | ICD-10-CM | POA: Diagnosis not present

## 2024-05-12 DIAGNOSIS — I129 Hypertensive chronic kidney disease with stage 1 through stage 4 chronic kidney disease, or unspecified chronic kidney disease: Secondary | ICD-10-CM | POA: Diagnosis not present

## 2024-05-12 DIAGNOSIS — I6329 Cerebral infarction due to unspecified occlusion or stenosis of other precerebral arteries: Secondary | ICD-10-CM | POA: Diagnosis not present

## 2024-05-12 DIAGNOSIS — Z8673 Personal history of transient ischemic attack (TIA), and cerebral infarction without residual deficits: Secondary | ICD-10-CM

## 2024-05-12 DIAGNOSIS — E785 Hyperlipidemia, unspecified: Secondary | ICD-10-CM | POA: Diagnosis not present

## 2024-05-12 DIAGNOSIS — Z888 Allergy status to other drugs, medicaments and biological substances status: Secondary | ICD-10-CM | POA: Diagnosis not present

## 2024-05-12 DIAGNOSIS — R2689 Other abnormalities of gait and mobility: Secondary | ICD-10-CM | POA: Diagnosis not present

## 2024-05-12 DIAGNOSIS — Z6831 Body mass index (BMI) 31.0-31.9, adult: Secondary | ICD-10-CM

## 2024-05-12 DIAGNOSIS — N189 Chronic kidney disease, unspecified: Secondary | ICD-10-CM | POA: Diagnosis present

## 2024-05-12 DIAGNOSIS — I63532 Cerebral infarction due to unspecified occlusion or stenosis of left posterior cerebral artery: Secondary | ICD-10-CM

## 2024-05-12 DIAGNOSIS — R4781 Slurred speech: Secondary | ICD-10-CM | POA: Diagnosis not present

## 2024-05-12 DIAGNOSIS — G319 Degenerative disease of nervous system, unspecified: Secondary | ICD-10-CM | POA: Diagnosis not present

## 2024-05-12 DIAGNOSIS — Z8052 Family history of malignant neoplasm of bladder: Secondary | ICD-10-CM | POA: Diagnosis not present

## 2024-05-12 DIAGNOSIS — I779 Disorder of arteries and arterioles, unspecified: Secondary | ICD-10-CM | POA: Diagnosis not present

## 2024-05-12 DIAGNOSIS — Z79899 Other long term (current) drug therapy: Secondary | ICD-10-CM

## 2024-05-12 DIAGNOSIS — E669 Obesity, unspecified: Secondary | ICD-10-CM | POA: Diagnosis not present

## 2024-05-12 DIAGNOSIS — R42 Dizziness and giddiness: Secondary | ICD-10-CM | POA: Diagnosis not present

## 2024-05-12 DIAGNOSIS — R9082 White matter disease, unspecified: Secondary | ICD-10-CM | POA: Diagnosis not present

## 2024-05-12 DIAGNOSIS — R2681 Unsteadiness on feet: Secondary | ICD-10-CM | POA: Diagnosis not present

## 2024-05-12 DIAGNOSIS — Z8249 Family history of ischemic heart disease and other diseases of the circulatory system: Secondary | ICD-10-CM | POA: Diagnosis not present

## 2024-05-12 DIAGNOSIS — R269 Unspecified abnormalities of gait and mobility: Secondary | ICD-10-CM | POA: Diagnosis not present

## 2024-05-12 DIAGNOSIS — I6523 Occlusion and stenosis of bilateral carotid arteries: Secondary | ICD-10-CM | POA: Diagnosis not present

## 2024-05-12 DIAGNOSIS — R471 Dysarthria and anarthria: Secondary | ICD-10-CM | POA: Diagnosis not present

## 2024-05-12 DIAGNOSIS — I10 Essential (primary) hypertension: Secondary | ICD-10-CM | POA: Diagnosis present

## 2024-05-12 DIAGNOSIS — I6622 Occlusion and stenosis of left posterior cerebral artery: Secondary | ICD-10-CM | POA: Diagnosis not present

## 2024-05-12 DIAGNOSIS — I69391 Dysphagia following cerebral infarction: Secondary | ICD-10-CM | POA: Diagnosis not present

## 2024-05-12 DIAGNOSIS — I672 Cerebral atherosclerosis: Secondary | ICD-10-CM | POA: Diagnosis not present

## 2024-05-12 DIAGNOSIS — I63521 Cerebral infarction due to unspecified occlusion or stenosis of right anterior cerebral artery: Secondary | ICD-10-CM | POA: Diagnosis not present

## 2024-05-12 DIAGNOSIS — I6389 Other cerebral infarction: Secondary | ICD-10-CM | POA: Diagnosis not present

## 2024-05-12 LAB — COMPREHENSIVE METABOLIC PANEL WITH GFR
ALT: 17 U/L (ref 0–44)
AST: 28 U/L (ref 15–41)
Albumin: 4 g/dL (ref 3.5–5.0)
Alkaline Phosphatase: 71 U/L (ref 38–126)
Anion gap: 13 (ref 5–15)
BUN: 23 mg/dL (ref 8–23)
CO2: 21 mmol/L — ABNORMAL LOW (ref 22–32)
Calcium: 9.2 mg/dL (ref 8.9–10.3)
Chloride: 105 mmol/L (ref 98–111)
Creatinine, Ser: 1.52 mg/dL — ABNORMAL HIGH (ref 0.61–1.24)
GFR, Estimated: 48 mL/min — ABNORMAL LOW (ref >60)
Glucose, Bld: 148 mg/dL — ABNORMAL HIGH (ref 70–99)
Potassium: 4.2 mmol/L (ref 3.5–5.1)
Sodium: 139 mmol/L (ref 135–145)
Total Bilirubin: 0.4 mg/dL (ref 0.0–1.2)
Total Protein: 6.5 g/dL (ref 6.5–8.1)

## 2024-05-12 LAB — DIFFERENTIAL
Abs Immature Granulocytes: 0.02 K/uL (ref 0.00–0.07)
Basophils Absolute: 0.1 K/uL (ref 0.0–0.1)
Basophils Relative: 1 %
Eosinophils Absolute: 0.1 K/uL (ref 0.0–0.5)
Eosinophils Relative: 2 %
Immature Granulocytes: 0 %
Lymphocytes Relative: 20 %
Lymphs Abs: 2 K/uL (ref 0.7–4.0)
Monocytes Absolute: 0.5 K/uL (ref 0.1–1.0)
Monocytes Relative: 6 %
Neutro Abs: 6.9 K/uL (ref 1.7–7.7)
Neutrophils Relative %: 71 %

## 2024-05-12 LAB — CBC
HCT: 43.7 % (ref 39.0–52.0)
Hemoglobin: 14.5 g/dL (ref 13.0–17.0)
MCH: 29.9 pg (ref 26.0–34.0)
MCHC: 33.2 g/dL (ref 30.0–36.0)
MCV: 90.1 fL (ref 80.0–100.0)
Platelets: 264 K/uL (ref 150–400)
RBC: 4.85 MIL/uL (ref 4.22–5.81)
RDW: 13.9 % (ref 11.5–15.5)
WBC: 9.6 K/uL (ref 4.0–10.5)
nRBC: 0 % (ref 0.0–0.2)

## 2024-05-12 LAB — ETHANOL: Alcohol, Ethyl (B): <15 mg/dL (ref <15)

## 2024-05-12 LAB — PROTIME-INR
INR: 1 (ref 0.8–1.2)
Prothrombin Time: 13.4 s (ref 11.4–15.2)

## 2024-05-12 LAB — MRSA NEXT GEN BY PCR, NASAL: MRSA by PCR Next Gen: NOT DETECTED

## 2024-05-12 LAB — APTT: aPTT: 29 s (ref 24–36)

## 2024-05-12 LAB — CBG MONITORING, ED: Glucose-Capillary: 164 mg/dL — ABNORMAL HIGH (ref 70–99)

## 2024-05-12 MED ORDER — ACETAMINOPHEN 160 MG/5ML PO SOLN: 650.0000 mg | ORAL | Status: DC | PRN

## 2024-05-12 MED ORDER — LABETALOL HCL 5 MG/ML IV SOLN: 10.0000 mg | INTRAVENOUS | Status: DC | PRN

## 2024-05-12 MED ORDER — TENECTEPLASE FOR STROKE: 0.2500 mg/kg | PACK | Freq: Once | INTRAVENOUS | Status: AC

## 2024-05-12 MED ORDER — SENNOSIDES-DOCUSATE SODIUM 8.6-50 MG PO TABS: 1.0000 | ORAL_TABLET | Freq: Every evening | ORAL | Status: DC | PRN

## 2024-05-12 MED ORDER — STROKE: EARLY STAGES OF RECOVERY BOOK
Freq: Once | Status: AC
  Filled 2024-05-12 (×2): qty 1

## 2024-05-12 MED ORDER — HYDRALAZINE HCL 20 MG/ML IJ SOLN: 10.0000 mg | INTRAMUSCULAR | Status: DC | PRN

## 2024-05-12 MED ORDER — PANTOPRAZOLE SODIUM 40 MG IV SOLR
40.0000 mg | Freq: Every day | INTRAVENOUS | Status: DC
  Administered 2024-05-12 – 2024-05-13 (×2): 40 mg via INTRAVENOUS
  Filled 2024-05-12 (×2): qty 10

## 2024-05-12 MED ORDER — GABAPENTIN 300 MG PO CAPS
300.0000 mg | ORAL_CAPSULE | Freq: Every day | ORAL | Status: DC
  Administered 2024-05-12 – 2024-05-13 (×2): 300 mg via ORAL
  Filled 2024-05-12 (×2): qty 1

## 2024-05-12 MED ORDER — ALFUZOSIN HCL ER 10 MG PO TB24
10.0000 mg | ORAL_TABLET | Freq: Every day | ORAL | Status: DC
  Administered 2024-05-13 – 2024-05-14 (×2): 10 mg via ORAL
  Filled 2024-05-12 (×2): qty 1

## 2024-05-12 MED ORDER — TENECTEPLASE FOR STROKE: 0.2500 mg/kg | PACK | Freq: Once | INTRAVENOUS | Status: DC

## 2024-05-12 MED ORDER — ACETAMINOPHEN 325 MG PO TABS
650.0000 mg | ORAL_TABLET | ORAL | Status: DC | PRN
Start: 2024-05-12 — End: 2024-05-14

## 2024-05-12 MED ORDER — MIRABEGRON ER 25 MG PO TB24
50.0000 mg | ORAL_TABLET | Freq: Every day | ORAL | Status: DC
  Administered 2024-05-13 – 2024-05-14 (×2): 50 mg via ORAL
  Filled 2024-05-12: qty 2
  Filled 2024-05-12: qty 1

## 2024-05-12 MED ORDER — CHLORHEXIDINE GLUCONATE CLOTH 2 % EX PADS
6.0000 | MEDICATED_PAD | Freq: Every day | CUTANEOUS | Status: DC
  Administered 2024-05-12 – 2024-05-13 (×2): 6 via TOPICAL

## 2024-05-12 MED ORDER — TENECTEPLASE FOR STROKE
PACK | INTRAVENOUS | Status: AC
  Administered 2024-05-12: 23 mg via INTRAVENOUS
  Filled 2024-05-12: qty 10

## 2024-05-12 MED ORDER — SODIUM CHLORIDE 0.9 % IV SOLN: INTRAVENOUS | Status: DC

## 2024-05-12 MED ORDER — IOHEXOL 350 MG/ML SOLN
100.0000 mL | Freq: Once | INTRAVENOUS | Status: AC | PRN
  Administered 2024-05-12: 75 mL via INTRAVENOUS

## 2024-05-12 MED ORDER — ACETAMINOPHEN 650 MG RE SUPP: 650.0000 mg | RECTAL | Status: DC | PRN

## 2024-05-12 NOTE — Telephone Encounter (Signed)
 I spoke with the patient's wife Jan and she informed me that patient is on route to ED( [facility]) for evaluation.

## 2024-05-12 NOTE — Telephone Encounter (Signed)
 FYI Only or Action Required?: FYI only for provider.  Patient was last seen in primary care on 12/10/2023 by Micheal Wolm ORN, MD.  Called Nurse Triage reporting Gait Problem.  Symptoms began several days ago.  Interventions attempted: Nothing.  Symptoms are: rapidly worsening.  Triage Disposition: Call EMS 911 Now  Patient/caregiver understands and will follow disposition?: No, refuses dispositionCopied from CRM [id number]. Topic: Clinical - Red Word Triage >> May 12, 2024  1:16 PM Jon Spencer wrote: Red Word that prompted transfer to Nurse Triage: Patient is has no strength in his body or legs, cant walk straight, or think clearly and has confusion.   ----------------------------------------------------------------------- From previous Reason for Contact - Scheduling: Patient/patient representative is calling to schedule an appointment. Refer to attachments for appointment information. Reason for Disposition  [1] Loss of speech or garbled speech AND [2] sudden onset AND [3] present now  Protocols used: Neurologic Deficit-A-AH

## 2024-05-12 NOTE — ED Notes (Signed)
 Called [facility] to transport patient to [facility] rm# [id number].  I advised patient just went to MRI so give it about 20 mins then come get the patient--s/w [facility].

## 2024-05-12 NOTE — ED Triage Notes (Addendum)
 Garbled speech garbled, unsteady gait Weakness, dizziness  Started Wednesday night  Drove self here

## 2024-05-12 NOTE — ED Triage Notes (Signed)
---  updated triage.   Patient arrives POV (drove to the ED) with complaints of shuffling gate and weakness x2 days.   Patient reports slurred/garbled speech 1~hour prior to arrival (1300). Patient symptoms reportedly worsened around 1300 and he drove here after calling his PCP.  Wife to bedside on arrival reinforcing that he did not have slurred speech this morning.

## 2024-05-12 NOTE — H&P (Addendum)
 NEUROLOGY H&P NOTE   Date of service: May 12, 2024 Patient Name: Jon Spencer MRN:  [id number] DOB:  [birth year] Chief Complaint: Slurred speech and shuffling gait  History of Present Illness  Jon Spencer is a 73 y.o. male with hx of hypertension and hyperlipidemia who presented to the emergency department for sudden onset slurred speech.  Upon further review, patient has been having an intermittent shuffling gait for about 3 days.  Patient reports that he was fine while he was at work until about 1300, when he had sudden onset slurred speech and word finding difficulties.  He called his primary care office and was advised to come to the emergency department.  Initial CT head revealed no acute abnormality, but CT angiogram revealed near occlusive stenosis of P1 segment of left PCA and moderate stenosis of mid basilar artery.  Given stenosis in posterior circulation, question of posterior stroke was raised as a cause of patient's symptoms, and TNK was offered.  TNK was given at 1450, and patient was transferred here for further care. Code status was discussed, and patient states that he would like to be full code.   Last known well: 1300 Modified rankin score: 0-Completely asymptomatic and back to baseline post- stroke IV Thrombolysis: Yes, given at 1450 Thrombectomy: No, no LVO NIHSS components Score: Comment  1a Level of Conscious 0[x]  1[]  2[]  3[]      1b LOC Questions 0[x]  1[]  2[]       1c LOC Commands 0[x]  1[]  2[]       2 Best Gaze 0[x]  1[]  2[]       3 Visual 0[x]  1[]  2[]  3[]      4 Facial Palsy 0[x]  1[]  2[]  3[]      5a Motor Arm - left 0[x]  1[]  2[]  3[]  4[]  UN[]    5b Motor Arm - Right 0[x]  1[]  2[]  3[]  4[]  UN[]    6a Motor Leg - Left 0[x]  1[]  2[]  3[]  4[]  UN[]    6b Motor Leg - Right 0[x]  1[]  2[]  3[]  4[]  UN[]    7 Limb Ataxia 0[]  1[x]  2[]  UN[]      8 Sensory 0[]  1[x]  2[]  UN[]      9 Best Language 0[x]  1[]  2[]  3[]      10 Dysarthria 0[]  1[x]  2[]  UN[]      11 Extinct. and Inattention 0[x]  1[]   2[]       TOTAL:3       ROS  Comprehensive ROS performed and pertinent positives documented in the HPI  Past History   Past Medical History:  Diagnosis Date   ADD 01/17/2010   Complication of anesthesia    takes awhile to wake up   History of back surgery 07/2018   Tumor removal   History of kidney stones    pt. 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/01/2010   OSTEOARTHRITIS, HAND 04/17/2010   TINNITUS 06/26/2009   TRIGGER FINGER 01/18/2009   Past Surgical History:  Procedure Laterality Date   APPENDECTOMY  1969   COLONOSCOPY     LAMINECTOMY N/A 07/12/2018   Procedure: Lumbar three-four Laminectomy for resection of intradural tumor;  Surgeon: Colon Shove, MD;  Location: [facility] OR;  Service: Neurosurgery;  Laterality: N/A;   VASECTOMY  1992   Family History  Problem Relation Age of Onset   Stroke Mother    Bladder Cancer Father    Stroke Sister    Arthritis Other    Hyperlipidemia Other    Hypertension Other  Sudden death Other    Social History   Socioeconomic History   Marital status: Married    Spouse name: Not on file   Number of children: Not on file   Years of education: Not on file   Highest education level: Some college, no degree  Occupational History   Not on file  Tobacco Use   Smoking status: Never   Smokeless tobacco: Never  Vaping Use   Vaping status: Never Used  Substance and Sexual Activity   Alcohol use: Yes    Alcohol/week: 1.0 - 4.0 standard drink of alcohol    Types: 1 - 4 Glasses of wine per week   Drug use: Never   Sexual activity: Not on file  Other Topics Concern   Not on file  Social History Narrative   Lives with wife.  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  One daughter. 3 step.     Social Drivers of Corporate investment banker Strain: Low Risk  (05/19/2022)   Overall Financial Resource Strain (CARDIA)    Difficulty of Paying Living Expenses: Not hard at all   Food Insecurity: No Food Insecurity (05/19/2022)   Hunger Vital Sign    Worried About Running Out of Food in the Last Year: Never true    Ran Out of Food in the Last Year: Never true  Transportation Needs: No Transportation Needs (05/19/2022)   PRAPARE - Administrator, Civil Service (Medical): No    Lack of Transportation (Non-Medical): No  Physical Activity: Unknown (05/19/2022)   Exercise Vital Sign    Days of Exercise per Week: Patient declined    Minutes of Exercise per Session: Not on file  Stress: Stress Concern Present (05/19/2022)   [employer] of Occupational Health - Occupational Stress Questionnaire    Feeling of Stress : To some extent  Social Connections: Socially Integrated (05/19/2022)   Social Connection and Isolation Panel    Frequency of Communication with Friends and Family: Three times a week    Frequency of Social Gatherings with Friends and Family: Once a week    Attends Religious Services: More than 4 times per year    Active Member of [employer] or Organizations: Yes    Attends Engineer, structural: More than 4 times per year    Marital Status: Married   Allergies  Allergen Reactions   Tolectin [Tolmetin Sodium] Other (See Comments)    Fever 104, breathing problems   Meloxicam Other (See Comments)    REACTION: Flushed, high temp   Univasc [Moexipril Hydrochloride] Cough    coughing    Medications   Current Facility-Administered Medications:    [START ON 05/13/2024]  stroke: early stages of recovery book, , Does not apply, Once, Voncile Isles, MD   0.9 %  sodium chloride  infusion, , Intravenous, Continuous, Arora, Ashish, MD, Last Rate: 40 mL/hr at 05/12/24 1516, New Bag at 05/12/24 1516   acetaminophen  (TYLENOL ) tablet 650 mg, 650 mg, Oral, Q4H PRN **OR** acetaminophen  (TYLENOL ) 160 MG/5ML solution 650 mg, 650 mg, Per Tube, Q4H PRN **OR** acetaminophen  (TYLENOL ) suppository 650 mg, 650 mg, Rectal, Q4H PRN, Arora, Ashish, MD    pantoprazole  (PROTONIX ) injection 40 mg, 40 mg, Intravenous, QHS, Arora, Ashish, MD   senna-docusate (Senokot-S) tablet 1 tablet, 1 tablet, Oral, QHS PRN, Arora, Ashish, MD  Current Outpatient Medications:    albuterol  (VENTOLIN  HFA) 108 (90 Base) MCG/ACT inhaler, Inhale 2 puffs into the lungs every 6 (six) hours as needed for wheezing or  shortness of breath., Disp: 8 g, Rfl: 0   alfuzosin  (UROXATRAL ) 10 MG 24 hr tablet, Take 10 mg by mouth daily., Disp: , Rfl:    amLODipine  (NORVASC ) 10 MG tablet, TAKE 1 TABLET BY MOUTH EVERY DAY, Disp: 90 tablet, Rfl: 0   benzonatate  (TESSALON ) 200 MG capsule, Take 1 capsule (200 mg total) by mouth 3 (three) times daily as needed., Disp: 45 capsule, Rfl: 0   doxycycline  (VIBRA -TABS) 100 MG tablet, Take 1 tablet (100 mg total) by mouth 2 (two) times daily., Disp: 14 tablet, Rfl: 0   DULoxetine  (CYMBALTA ) 30 MG capsule, Take one tablet by mouth once daily for one week and then increase to two tablets by mouth once daily., Disp: 60 capsule, Rfl: 3   gabapentin  (NEURONTIN ) 300 MG capsule, TAKE 1 CAPSULE BY MOUTH TWICE A DAY, Disp: 60 capsule, Rfl: 3   imiquimod (ALDARA) 5 % cream, Apply topically daily., Disp: , Rfl:    losartan  (COZAAR ) 100 MG tablet, TAKE 1 TABLET BY MOUTH EVERY DAY, Disp: 30 tablet, Rfl: 0   mirabegron  ER (MYRBETRIQ ) 50 MG TB24 tablet, TAKE 1 TABLET BY MOUTH EVERY DAY, Disp: 30 tablet, Rfl: 5   Multiple Vitamins-Minerals (MULTIVITAMIN ADULT) CHEW, Chew 1 each by mouth daily., Disp: , Rfl:    predniSONE  (DELTASONE ) 10 MG tablet, Taper as follows: 6-6-4-4-3-3-2-2-1-1 (Patient not taking: Reported on 11/25/2023), Disp: 32 tablet, Rfl: 0   sildenafil  (VIAGRA ) 100 MG tablet, Take 1 tablet (100 mg total) by mouth daily as needed for erectile dysfunction., Disp: 6 tablet, Rfl: 3   testosterone  cypionate (DEPOTESTOSTERONE CYPIONATE) 200 MG/ML injection, SMARTSIG:0.3 Milliliter(s) IM Twice a Week, Disp: , Rfl:    Vitals   Vitals:   05/12/24 1545  05/12/24 1600 05/12/24 1615 05/12/24 1654  BP: (!) 156/80 (!) 154/75 (!) 152/76 (!) 163/81  Pulse: 91 87 84 86  Resp: 19 18 (!) 21 19  Temp:  98 F (36.7 C)    TempSrc:      SpO2: 95% 93% 97% 98%  Weight:      Height:         Body mass index is 31.44 kg/m.  Physical Exam   Constitutional: Appears well-developed and well-nourished.  Psych: Affect appropriate to situation.  Eyes: No scleral injection.  HENT: No OP obstruction.  Head: Normocephalic.  Cardiovascular: Normal rate and regular rhythm. Swishing 3/6 murmur Respiratory: Effort normal, non-labored breathing. Lungs clear to auscultation Skin: WDI.   Neurologic Examination    NEURO:  Mental Status: AA&Ox3, able to give clear and coherent history of present illness  Speech/Language: speech is without aphasia but not completely back to baseline.  Naming, repetition, fluency, and comprehension intact.  Cranial Nerves:  II: Right pupil slightly larger than left, both briskly reactive. Visual fields full. III, IV, VI: EOMI. Eyelids elevate symmetrically.  V: Sensation is intact to light touch with some numbness in left V1 (from previous shingles outbreak) VII: Smile is symmetrical.  VIII: hearing intact to voice. IX, X: Palate elevates symmetrically. Phonation is mildly slurred KP:Dynloizm shrug 5/5. XII: tongue is midline without fasciculations. Motor: 5/5 strength to all muscle groups tested other than perhaps slight 4+/5 in right HF  Tone: is normal and bulk is normal Sensation- Intact to light touch bilaterally. Extinction absent to light touch to DSS.  Coordination: RUE slight ataxia, HKS: no ataxia in  Gait- deferred    Labs   CBC:  Recent Labs  Lab 05/12/24 1403  WBC 9.6  NEUTROABS 6.9  HGB 14.5  HCT 43.7  MCV 90.1  PLT 264   Basic Metabolic Panel:  Lab Results  Component Value Date   NA 139 05/12/2024   K 4.2 05/12/2024   CO2 21 (L) 05/12/2024   GLUCOSE 148 (H) 05/12/2024   BUN 23  05/12/2024   CREATININE 1.52 (H) 05/12/2024   CALCIUM  9.2 05/12/2024   GFRNONAA 48 (L) 05/12/2024   GFRAA >60 06/29/2018   Lipid Panel:  Lab Results  Component Value Date   LDLCALC 155 (H) 05/18/2017   HgbA1c: No results found for: HGBA1C Urine Drug Screen: No results found for: LABOPIA, COCAINSCRNUR, LABBENZ, AMPHETMU, THCU, LABBARB  Alcohol Level     Component Value Date/Time   ETH <15 05/12/2024 1402   INR  Lab Results  Component Value Date   INR 1.0 05/12/2024   APTT  Lab Results  Component Value Date   APTT 29 05/12/2024     CT Head without contrast(Personally reviewed): No acute abnormality, age-related atrophy and moderately advanced cerebral white matter disease, aspects 10  CT angio Head and Neck with contrast(Personally reviewed): Near occlusive stenosis of the left PCA P1, moderate stenosis of mid basilar artery, moderate to severe stenosis of communicating segments of bilateral internal carotid arteries  MRI Brain(Personally reviewed): 15 mm acute infarct in the left aspect of the pons, small cortical/subcortical infarct within posterior right frontal lobe, late subacute or chronic, chronic lacunar infarcts within left corona radiata and about the bilateral basal ganglia, moderate to advanced cerebral white matter ischemic disease and atrophy  Assessment   Jon Spencer is a 73 y.o. male with a history of hypertension and hyperlipidemia who presents with sudden onset slurred speech along with intermittent shuffling gait for 3 days.  Initial CT head was negative, but CT angiogram revealed basilar artery stenosis.  TNK was offered due to possibility of posterior stroke and was given at 1450.  MRI was performed, and patient was found to have a left pontine infarct.  He was then transferred here for further care.  Primary Diagnosis:  Left pontine infarct, likely etiology small vessel disease, vs. Large vessel disease  Secondary  Diagnosis: Essential (primary) hypertension  Recommendations  - Admit to ICU for further monitoring - Keep blood pressure under 180/105, use labetalol  if HR > 60, hyrdalazine if HR < 60 or too early to give labetalol ; add clevidipine if needed - Avoid needlesticks and tube insertions, no sticks in noncompressible areas - TTE  - Check A1c and LDL + add statin per guidelines - antiplt/anticoag to be started 24 hours after TNK administration - NIHSS and vital signs every 15 minutes x 2 hours, every 30 minutes x 6 hours, every hour x 16 hours and then every 4 hours - STAT head CT for any change in neuro exam - Tele - PT/OT/SLP - Stroke education - Amb referral to neurology upon discharge   ______________________________________________________________________ Patient seen by NP with MD, MD to edit note as needed.  Signed, Cortney E Everitt Clint Kill, NP [facility]  Attending Neurologist's note:  I personally saw this patient, gathering history, performing a full neurologic examination, reviewing relevant labs, personally reviewing relevant imaging including MRI, CTA head and neck, head CT, and formulated the assessment and plan, adding the note above for completeness and clarity to accurately reflect my thoughts   Lola Jernigan MD-PhD [facility] [phone] Available 7 AM to 7 PM, outside these hours please contact Neurologist on call listed on AMION  CRITICAL CARE Performed  by: Lola LITTIE Jernigan   Total critical care time: 35 minutes  Critical care time was exclusive of separately billable procedures and treating other patients.  Critical care was necessary to treat or prevent imminent or life-threatening deterioration.  Critical care was time spent personally by me on the following activities: development of treatment plan with patient and/or surrogate as well as nursing, discussions with consultants, evaluation of patient's response to treatment,  examination of patient, obtaining history from patient or surrogate, ordering and performing treatments and interventions, ordering and review of laboratory studies, ordering and review of radiographic studies, pulse oximetry and re-evaluation of patient's condition.

## 2024-05-12 NOTE — Consult Note (Signed)
 [facility] Telemedicine Consult   Requesting Provider: Dr Dean Consult Participants: Dr. RONAL Lav,  Bedside RN Powell Location of the provider: [facility] Location of the patient: [facility] Bed 00[address]  This consult was provided via telemedicine with 2-way video and audio communication. The patient/family was informed that care would be provided in this way and agreed to receive care in this manner.   Chief Complaint: slurred speech, gait difficulty  HPI: 73 year old who has a past medical history of hypertension hyperlipidemia, presented to the emergency department for sudden onset of slurred speech. Initial report sounded like he has been having symptoms for 3 days.  Wife reports that he usually shuffles some when he is wearing his bedroom slippers but for the past 3 days he has been shuffling more and this morning he was shuffling even while wearing his work shoes.  He went to work normally.  He reports that at work, he was fine until he had lunch, when he had sudden onset of slurred speech and his speech did not improve after that, for which he immediately called his doctor's office which she had been planning to do already for the off-and-on shuffling that was worsening.  The doctor's office asked him to come to the ED. A code stroke was activated in the ED   Past Medical History:  Diagnosis Date   ADD 01/17/2010   Complication of anesthesia    takes awhile to wake up   History of back surgery 07/2018   Tumor removal   History of kidney stones    pt. 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/01/2010   OSTEOARTHRITIS, HAND 04/17/2010   TINNITUS 06/26/2009   TRIGGER FINGER 01/18/2009     Current Facility-Administered Medications:    iohexol  (OMNIPAQUE ) 350 MG/ML injection 100 mL, 100 mL, Intravenous, Once PRN, Priyana Mccarey, MD  Current Outpatient Medications:    albuterol  (VENTOLIN  HFA) 108 (90 Base) MCG/ACT  inhaler, Inhale 2 puffs into the lungs every 6 (six) hours as needed for wheezing or shortness of breath., Disp: 8 g, Rfl: 0   alfuzosin  (UROXATRAL ) 10 MG 24 hr tablet, Take 10 mg by mouth daily., Disp: , Rfl:    amLODipine  (NORVASC ) 10 MG tablet, TAKE 1 TABLET BY MOUTH EVERY DAY, Disp: 90 tablet, Rfl: 0   benzonatate  (TESSALON ) 200 MG capsule, Take 1 capsule (200 mg total) by mouth 3 (three) times daily as needed., Disp: 45 capsule, Rfl: 0   doxycycline  (VIBRA -TABS) 100 MG tablet, Take 1 tablet (100 mg total) by mouth 2 (two) times daily., Disp: 14 tablet, Rfl: 0   DULoxetine  (CYMBALTA ) 30 MG capsule, Take one tablet by mouth once daily for one week and then increase to two tablets by mouth once daily., Disp: 60 capsule, Rfl: 3   gabapentin  (NEURONTIN ) 300 MG capsule, TAKE 1 CAPSULE BY MOUTH TWICE A DAY, Disp: 60 capsule, Rfl: 3   imiquimod (ALDARA) 5 % cream, Apply topically daily., Disp: , Rfl:    losartan  (COZAAR ) 100 MG tablet, TAKE 1 TABLET BY MOUTH EVERY DAY, Disp: 30 tablet, Rfl: 0   mirabegron  ER (MYRBETRIQ ) 50 MG TB24 tablet, TAKE 1 TABLET BY MOUTH EVERY DAY, Disp: 30 tablet, Rfl: 5   Multiple Vitamins-Minerals (MULTIVITAMIN ADULT) CHEW, Chew 1 each by mouth daily., Disp: , Rfl:    predniSONE  (DELTASONE ) 10 MG tablet, Taper as follows: 6-6-4-4-3-3-2-2-1-1 (Patient not taking: Reported on 11/25/2023), Disp: 32 tablet, Rfl: 0  sildenafil  (VIAGRA ) 100 MG tablet, Take 1 tablet (100 mg total) by mouth daily as needed for erectile dysfunction., Disp: 6 tablet, Rfl: 3   testosterone  cypionate (DEPOTESTOSTERONE CYPIONATE) 200 MG/ML injection, SMARTSIG:0.3 Milliliter(s) IM Twice a Week, Disp: , Rfl:     LKW: 12:45 PM IV thrombolysis given?:  Yes IR Thrombectomy? No, low NIH Modified Rankin Scale: 1-No significant post stroke disability and can perform usual duties with stroke symptoms Time of teleneurologist evaluation: 2:03 PM  Exam: Vitals:   05/12/24 1354  BP: (!) 154/78  Pulse: (!)  103  Resp: 20  Temp: 98.1 F (36.7 C)  SpO2: 96%    General: Awake alert in no distress Neurological exam Awake alert oriented x 3.  Mild dysarthria.  No aphasia.  Cranial nerves II to XII intact.  Motor examination with no drift.  Sensation intact light touch.  No coordination deficits   NIHSS-1 for dysarthria  Imaging Reviewed: CT head no acute changes.  CT angiography head and neck showed near occlusive stenosis of the left P1 segment.  Moderate stenosis of the mid basilar artery.  Moderate to severe stenosis of communicating segments of the internal carotid arteries bilaterally.  Calcified plaque within carotid bulbs bilaterally with less than 20% stenosis  Labs reviewed in epic and pertinent values follow: CBC    Component Value Date/Time   WBC 9.6 05/12/2024 1403   RBC 4.85 05/12/2024 1403   HGB 14.5 05/12/2024 1403   HCT 43.7 05/12/2024 1403   PLT 264 05/12/2024 1403   MCV 90.1 05/12/2024 1403   MCH 29.9 05/12/2024 1403   MCHC 33.2 05/12/2024 1403   RDW 13.9 05/12/2024 1403   LYMPHSABS 2.0 05/12/2024 1403   MONOABS 0.5 05/12/2024 1403   EOSABS 0.1 05/12/2024 1403   BASOSABS 0.1 05/12/2024 1403   CMP     Component Value Date/Time   NA 141 02/11/2022 1828   K 3.6 02/11/2022 1828   CL 104 02/11/2022 1828   CO2 26 02/11/2022 1828   GLUCOSE 100 (H) 02/11/2022 1828   BUN 29 (H) 02/11/2022 1828   CREATININE 1.47 (H) 02/11/2022 1828   CALCIUM  9.6 02/11/2022 1828   PROT 6.7 05/18/2017 1141   ALBUMIN 4.4 05/18/2017 1141   AST 15 05/18/2017 1141   ALT 22 05/18/2017 1141   ALKPHOS 65 05/18/2017 1141   BILITOT 0.8 05/18/2017 1141   GFRNONAA 50 (L) 02/11/2022 1828   GFRAA >60 06/29/2018 0843   Assessment: 73 year old with past history of hypertension hyperlipidemia presented to the emergency department for evaluation of sudden onset of slurred speech at 12:45 PM. Initially reported to have some other symptoms such as gait disturbance and shuffling of gait as well as  generalized weakness for the past 3 days.  Took her a while to get detailed history from the wife and the patient, and in the interim, I did obtain a CTA head and neck as I was trying to get the history from the wife on the phone and from the patient on the camera. The CT angiography revealed near occlusive left P1 stenosis and moderate mid basilar stenosis. I had initially thought of not offering TNK because of mild symptoms and exam on the camera being of limited quality for neuromuscular weakness considering other neuromuscular disorders in the differentials but upon my review of the CT angio with the left P1 stenosis, his symptoms very well could be from that vessel. The wife and the patient also clarified that the symptoms that he been having  the past 3 days were not persistent and he did go to work this morning although he was shuffling somewhat but he was able to get to work, and it was at work after lunch when he had the sudden onset of slurred speech. After detailed discussion of the risk benefits and alternatives of IV TNK, the patient and wife agreed to proceed with TNK. Thrombectomy was not offered at this time due to firstly  mild symptoms and secondly no clear evidence of occlusion of the vessel.  Impression: Acute ischemic stroke involving the left posterior cerebral artery, likely atherosclerotic disease/large vessel etiology  Recommendations:  Admit to neuro ICU at [facility] accepting Post TNK neurochecks and vitals Stat CT head and consider repeat CTA if his symptoms worsen or if he has more neurodeficits No antiplatelets or anticoagulants 24-hour post TNK MRI ordered 2D echo, A1c, lipid panel Therapy assessments ICU management and further management per the inpatient neurology team  Plan communicated with Dr. Dean and Sidra Ruby, EDPs at [facility]. Plan also discussed with patient, his wife over the camera. I have notified my team at the [facility]    Risks benefits and alternatives of IV TNKase  discussed CT head personally reviewed prior to TNK administration-no evidence of bleed   CRITICAL CARE ATTESTATION Performed by: Eligio Lav, MD Total critical care time: 55 minutes Critical care time was exclusive of separately billable procedures and treating other patients and/or supervising APPs/Residents/Students Critical care was necessary to treat or prevent imminent or life-threatening deterioration. This patient is critically ill and at significant risk for neurological worsening and/or death and care requires constant monitoring. Critical care was time spent personally by me on the following activities: development of treatment plan with patient and/or surrogate as well as nursing, discussions with consultants, evaluation of patient's response to treatment, examination of patient, obtaining history from patient or surrogate, ordering and performing treatments and interventions, ordering and review of laboratory studies, ordering and review of radiographic studies, pulse oximetry, re-evaluation of patient's condition, participation in multidisciplinary rounds and medical decision making of high complexity in the care of this patient.   -- Eligio Lav, MD Neurologist [facility] Pager: [phone]

## 2024-05-12 NOTE — ED Provider Notes (Signed)
 [facility] EMERGENCY DEPARTMENT AT [facility] Provider Note   CSN: [id number] Arrival date & time: 05/12/24  1345     Patient presents with: Dizziness   Jon Spencer is a 73 y.o. male.   Patient with history of hypertension, hyperlipidemia --presents to the emergency department today for evaluation of strokelike symptoms.  Patient reports a change in speech at approximately 1 PM today.  He was having some trouble getting words out and speech was garbled at times.  Patient's wife describes his current speech like his tongue being stuck to the roof of his mouth.  Patient also reports having dizziness described as an off-balance sensation since the evening of July 9.  He has not fallen.  He denies weakness in the arms of the legs.  No vision changes or loss.  Patient was at work when his symptoms started.  Wife confirms that speech symptoms were not present this morning.  No headache.  No chest pain or shortness of breath.       Prior to Admission medications   Medication Sig Start Date End Date Taking? Authorizing Provider  albuterol  (VENTOLIN  HFA) 108 (90 Base) MCG/ACT inhaler Inhale 2 puffs into the lungs every 6 (six) hours as needed for wheezing or shortness of breath. 11/25/23   Mahlon Comer BRAVO, MD  alfuzosin  (UROXATRAL ) 10 MG 24 hr tablet Take 10 mg by mouth daily. 10/09/23   [provider]  amLODipine  (NORVASC ) 10 MG tablet TAKE 1 TABLET BY MOUTH EVERY DAY 05/08/24   Burchette, Wolm ORN, MD  benzonatate  (TESSALON ) 200 MG capsule Take 1 capsule (200 mg total) by mouth 3 (three) times daily as needed. 11/25/23   Tabori, Katherine E, MD  doxycycline  (VIBRA -TABS) 100 MG tablet Take 1 tablet (100 mg total) by mouth 2 (two) times daily. 11/25/23   Tabori, Katherine E, MD  DULoxetine  (CYMBALTA ) 30 MG capsule Take one tablet by mouth once daily for one week and then increase to two tablets by mouth once daily. 05/27/22   Burchette, Wolm ORN, MD  gabapentin  (NEURONTIN ) 300 MG  capsule TAKE 1 CAPSULE BY MOUTH TWICE A DAY 03/30/24   Burchette, Wolm ORN, MD  imiquimod (ALDARA) 5 % cream Apply topically daily. 03/09/23   [provider]  losartan  (COZAAR ) 100 MG tablet TAKE 1 TABLET BY MOUTH EVERY DAY 05/10/24   Burchette, Wolm ORN, MD  mirabegron  ER (MYRBETRIQ ) 50 MG TB24 tablet TAKE 1 TABLET BY MOUTH EVERY DAY 10/15/23   Burchette, Wolm ORN, MD  Multiple Vitamins-Minerals (MULTIVITAMIN ADULT) CHEW Chew 1 each by mouth daily.    [provider]  predniSONE  (DELTASONE ) 10 MG tablet Taper as follows: 6-6-4-4-3-3-2-2-1-1 Patient not taking: Reported on 11/25/2023 05/18/23   Micheal Wolm ORN, MD  sildenafil  (VIAGRA ) 100 MG tablet Take 1 tablet (100 mg total) by mouth daily as needed for erectile dysfunction. 04/14/24   Burchette, Wolm ORN, MD  testosterone  cypionate (DEPOTESTOSTERONE CYPIONATE) 200 MG/ML injection SMARTSIG:0.3 Milliliter(s) IM Twice a Week 10/28/23   [provider]    Allergies: Tolectin [tolmetin sodium], Meloxicam, and Univasc [moexipril hydrochloride]    Review of Systems  Updated Vital Signs BP (!) 154/78   Pulse (!) 103   Temp 98.1 F (36.7 C) (Oral)   Resp 20   Wt 94.8 kg   SpO2 96%   BMI 31.78 kg/m   Physical Exam Vitals and nursing note reviewed.  Constitutional:      Appearance: He is well-developed.  HENT:  Head: Normocephalic and atraumatic.     Right Ear: External ear normal.     Left Ear: External ear normal.     Nose: Nose normal.     Mouth/Throat:     Pharynx: Uvula midline.  Eyes:     General: Lids are normal.     Conjunctiva/sclera: Conjunctivae normal.     Pupils: Pupils are equal, round, and reactive to light.  Cardiovascular:     Rate and Rhythm: Normal rate and regular rhythm.  Pulmonary:     Effort: Pulmonary effort is normal.     Breath sounds: Normal breath sounds.  Abdominal:     Palpations: Abdomen is soft.     Tenderness: There is no abdominal tenderness.  Musculoskeletal:         General: Normal range of motion.     Cervical back: Normal range of motion and neck supple. No tenderness or bony tenderness.  Skin:    General: Skin is warm and dry.  Neurological:     Mental Status: He is alert and oriented to person, place, and time.     GCS: GCS eye subscore is 4. GCS verbal subscore is 5. GCS motor subscore is 6.     Cranial Nerves: Dysarthria present. No cranial nerve deficit.     Sensory: No sensory deficit.     Motor: No abnormal muscle tone.     Coordination: Coordination normal.     Comments: No gross upper or lower extremity weakness on exam.  Patient is stumbling over some words, mild dysarthria noted.  No focal facial weakness or facial droop.     (all labs ordered are listed, but only abnormal results are displayed) Labs Reviewed  COMPREHENSIVE METABOLIC PANEL WITH GFR - Abnormal; Notable for the following components:      Result Value   CO2 21 (*)    Glucose, Bld 148 (*)    Creatinine, Ser 1.52 (*)    GFR, Estimated 48 (*)    All other components within normal limits  CBG MONITORING, ED - Abnormal; Notable for the following components:   Glucose-Capillary 164 (*)    All other components within normal limits  ETHANOL  PROTIME-INR  APTT  CBC  DIFFERENTIAL  URINE DRUG SCREEN  HEMOGLOBIN A1C    ED ECG REPORT   Date: 05/12/2024  Rate: 101  Rhythm: sinus tachycardia  QRS Axis: left  Intervals: PR prolonged  ST/T Wave abnormalities: nonspecific T wave changes  Conduction Disutrbances:none  Narrative Interpretation:   Old EKG Reviewed: unchanged  I have personally reviewed the EKG tracing and agree with the computerized printout as noted.   Radiology: CT HEAD CODE STROKE WO CONTRAST Result Date: 05/12/2024 CLINICAL DATA:  Code stroke.  Dizziness and unsteady gait. EXAM: CT HEAD WITHOUT CONTRAST TECHNIQUE: Contiguous axial images were obtained from the base of the skull through the vertex without intravenous contrast. RADIATION DOSE  REDUCTION: This exam was performed according to the departmental dose-optimization program which includes automated exposure control, adjustment of the mA and/or kV according to patient size and/or use of iterative reconstruction technique. COMPARISON:  None Available. FINDINGS: Brain: Moderate generalized cerebral volume loss. Chronic lacunar infarcts within the left internal and external capsules. Moderate periventricular and deep cerebral white matter disease. No evidence of hemorrhage, mass or acute cortical infarct. Vascular: Moderate calcific atheromatous disease within the carotid siphons. Skull: Intact and unremarkable. Sinuses/Orbits: Clear paranasal sinuses.  Normal orbits. Other: None. ASPECTS [facility] Stroke Program Early CT Score) - Ganglionic level  infarction (caudate, lentiform nuclei, internal capsule, insula, M1-M3 cortex): 7. - Supraganglionic infarction (M4-M6 cortex): 3. Total score (0-10 with 10 being normal): 10. IMPRESSION: 1. Age-related atrophy and moderately she advanced cerebral white matter disease. 2. ASPECTS is 10. 3. These results were communicated via the amion paging service at the time of interpretation on 05/12/2024 at 2:14 pm to provider Dr. Voncile. Electronically Signed   By: Evalene Coho M.D.   On: 05/12/2024 14:18     Procedures   Medications Ordered in the ED   stroke: early stages of recovery book (has no administration in time range)  0.9 %  sodium chloride  infusion ( Intravenous New Bag/Given 05/12/24 1516)  acetaminophen  (TYLENOL ) tablet 650 mg (has no administration in time range)    Or  acetaminophen  (TYLENOL ) 160 MG/5ML solution 650 mg (has no administration in time range)    Or  acetaminophen  (TYLENOL ) suppository 650 mg (has no administration in time range)  senna-docusate (Senokot-S) tablet 1 tablet (has no administration in time range)  pantoprazole  (PROTONIX ) injection 40 mg (has no administration in time range)  iohexol  (OMNIPAQUE ) 350 MG/ML  injection 100 mL (75 mLs Intravenous Contrast Given 05/12/24 1420)  tenecteplase  (TNKASE ) injection for Stroke 23 mg (23 mg Intravenous Given 05/12/24 1450)    ED Course  Patient seen and examined. History obtained directly from patient.  Wife arrived during initial exam.  Additional history obtained from her.  Given change in symptoms, specifically dysarthria, at 1 PM today, code stroke activated.  Labs/EKG: Code stroke order set, stroke panel ordered including PT/INR which is part of that set  Imaging: CT head without contrast  Medications/Fluids: None ordered  Most recent vital signs reviewed and are as follows: BP (!) 154/78   Pulse (!) 103   Temp 98.1 F (36.7 C) (Oral)   Resp 20   Wt 94.8 kg   SpO2 96%   BMI 31.78 kg/m   Initial impression: Possible CVA with symptoms starting Wednesday evening with disequilibrium, progressive to some speech changes today.  2:43 PM Notified by neuro that thrombolysis ordered. Will be admitted to neuro ICU.   Most current vital signs reviewed and are as follows: BP (!) 154/78   Pulse (!) 103   Temp 98.1 F (36.7 C) (Oral)   Resp 20   Ht 5' 8 (1.727 m)   Wt (S) 93.8 kg   SpO2 96%   BMI 31.44 kg/m   Plan: Neuro ICU admit.   3:11 PM patient reassessed.  Unchanged at this time.  Patient is having a little flushing of his skin, noticeable in the face and neck.  No airway swelling or difficulty breathing.   3:47 PM Bed assigned.   CRITICAL CARE Performed by: Fonda Ruby PA-C Total critical care time: 40 minutes Critical care time was exclusive of separately billable procedures and treating other patients. Critical care was necessary to treat or prevent imminent or life-threatening deterioration. Critical care was time spent personally by me on the following activities: development of treatment plan with patient and/or surrogate as well as nursing, discussions with consultants, evaluation of patient's response to treatment,  examination of patient, obtaining history from patient or surrogate, ordering and performing treatments and interventions, ordering and review of laboratory studies, ordering and review of radiographic studies, pulse oximetry and re-evaluation of patient's condition.  Medical Decision Making Amount and/or Complexity of Data Reviewed Labs: ordered. Radiology: ordered.   Patient with symptoms concerning for acute stroke.  Thrombolysis given.  Transfer to ICU.     Final diagnoses:  Acute CVA (cerebrovascular accident) [facility])    ED Discharge Orders     None          Desiderio Chew, PA-C 05/12/24 1551    Dean Clarity, MD 05/15/24 [phone]

## 2024-05-12 NOTE — ED Notes (Signed)
 Called a code stroke to [facility]--LSW was at  1pm.  Dizzy/slurred speech

## 2024-05-13 ENCOUNTER — Inpatient Hospital Stay (HOSPITAL_COMMUNITY)

## 2024-05-13 DIAGNOSIS — I6389 Other cerebral infarction: Secondary | ICD-10-CM

## 2024-05-13 DIAGNOSIS — I69391 Dysphagia following cerebral infarction: Secondary | ICD-10-CM

## 2024-05-13 DIAGNOSIS — I779 Disorder of arteries and arterioles, unspecified: Secondary | ICD-10-CM

## 2024-05-13 DIAGNOSIS — R29701 NIHSS score 1: Secondary | ICD-10-CM | POA: Diagnosis not present

## 2024-05-13 LAB — ECHOCARDIOGRAM COMPLETE
AR max vel: 0.74 cm2
AV Area VTI: 0.79 cm2
AV Area mean vel: 0.69 cm2
AV Mean grad: 21 mmHg
AV Peak grad: 32.5 mmHg
Ao pk vel: 2.85 m/s
Area-P 1/2: 3.79 cm2
Calc EF: 65.5 %
Height: 68 in
S' Lateral: 3.15 cm
Single Plane A2C EF: 67.6 %
Single Plane A4C EF: 63.7 %
Weight: 3308.66 [oz_av]

## 2024-05-13 LAB — LIPID PANEL
Cholesterol: 206 mg/dL — ABNORMAL HIGH (ref 0–200)
HDL: 43 mg/dL (ref >40)
LDL Cholesterol: 140 mg/dL — ABNORMAL HIGH (ref 0–99)
Total CHOL/HDL Ratio: 4.8 ratio
Triglycerides: 114 mg/dL (ref <150)
VLDL: 23 mg/dL (ref 0–40)

## 2024-05-13 MED ORDER — ROSUVASTATIN CALCIUM 5 MG PO TABS
10.0000 mg | ORAL_TABLET | Freq: Every day | ORAL | Status: DC
  Administered 2024-05-13 – 2024-05-14 (×2): 10 mg via ORAL
  Filled 2024-05-13 (×2): qty 2

## 2024-05-13 MED ORDER — ONDANSETRON HCL 4 MG/2ML IJ SOLN
INTRAMUSCULAR | Status: AC
  Administered 2024-05-13: 4 mg via INTRAVENOUS
  Filled 2024-05-13: qty 2

## 2024-05-13 MED ORDER — ONDANSETRON HCL 4 MG/2ML IJ SOLN: 4.0000 mg | Freq: Four times a day (QID) | INTRAMUSCULAR | Status: DC

## 2024-05-13 MED ORDER — ONDANSETRON HCL 4 MG/2ML IJ SOLN: 4.0000 mg | Freq: Four times a day (QID) | INTRAMUSCULAR | Status: DC | PRN

## 2024-05-13 MED ORDER — ASPIRIN 81 MG PO TBEC
81.0000 mg | DELAYED_RELEASE_TABLET | Freq: Every day | ORAL | Status: DC
  Administered 2024-05-13 – 2024-05-14 (×2): 81 mg via ORAL
  Filled 2024-05-13 (×2): qty 1

## 2024-05-13 NOTE — Evaluation (Signed)
 Occupational Therapy Evaluation Patient Details Name: Jon Spencer MRN: [id number] DOB: 1951/07/03 Today's Date: 05/13/2024   History of Present Illness   Pt is a 73 y.o. male who presented 05/12/24 with sudden onset slurred speech. Pt was also noted to have intermittent shuffling gait for ~3 days PTA. CT head revealed no acute abnormality, but CT angiogram revealed near occlusive stenosis of P1 segment of left PCA and moderate stenosis of mid basilar artery. Pt received TNK. MRI showed acute infarct within the L pons, late subacute or chronic small cortical/subcortical infarct within the posterior R frontal lobe, and chronic lacunar infarcts within the L corona radiata and  within/about the bilateral basal ganglia. PMH: ADD, HLD, HTN, tinnitus, trigger finger     Clinical Impressions Pt ind at baseline with ADLs/functional mobility, lives with spouse who can assist at d/c. Pt currently needing CGA overall for ADLs and mobility, mild unsteadiness. Pt occasionally reaching out for unilateral external support. Pt with speech deficits, needs incr time for communication and expressing self during session. Pt presenting with impairments listed below, will follow acutely. Recommend OP OT at d/c.      If plan is discharge home, recommend the following:   A little help with walking and/or transfers;A little help with bathing/dressing/bathroom;Assistance with cooking/housework;Direct supervision/assist for financial management;Direct supervision/assist for medications management;Assist for transportation;Help with stairs or ramp for entrance     Functional Status Assessment   Patient has had a recent decline in their functional status and demonstrates the ability to make significant improvements in function in a reasonable and predictable amount of time.     Equipment Recommendations   None recommended by OT     Recommendations for Other Services   PT consult      Precautions/Restrictions   Precautions Precautions: Fall Restrictions Weight Bearing Restrictions Per Provider Order: No     Mobility Bed Mobility               General bed mobility comments: in bathroom upon arrival, in chair at departure    Transfers Overall transfer level: Needs assistance Equipment used: None Transfers: Sit to/from Stand Sit to Stand: Contact guard assist                  Balance Overall balance assessment: Needs assistance Sitting-balance support: Feet supported, No upper extremity supported Sitting balance-Leahy Scale: Good Sitting balance - Comments: reaches outside BOS without LOB   Standing balance support: No upper extremity supported, During functional activity Standing balance-Leahy Scale: Fair                             ADL either performed or assessed with clinical judgement   ADL Overall ADL's : Needs assistance/impaired Eating/Feeding: Set up;Sitting   Grooming: Set up;Sitting   Upper Body Bathing: Contact guard assist;Standing   Lower Body Bathing: Contact guard assist;Sitting/lateral leans;Sit to/from stand   Upper Body Dressing : Contact guard assist;Sitting;Standing   Lower Body Dressing: Contact guard assist;Sitting/lateral leans;Sit to/from stand   Toilet Transfer: Occupational hygienist and Hygiene: Contact guard assist       Functional mobility during ADLs: Contact guard assist       Vision Baseline Vision/History: 1 Wears glasses Ability to See in Adequate Light: 0 Adequate Patient Visual Report: No change from baseline Vision Assessment?: No apparent visual deficits;Wears glasses for reading Additional Comments: vision assessment WFL, pt functionally able to read paragraph  without error     Perception Perception: Not tested       Praxis Praxis: Not tested       Pertinent Vitals/Pain Pain Assessment Pain Assessment:  No/denies pain     Extremity/Trunk Assessment Upper Extremity Assessment Upper Extremity Assessment: Left hand dominant RUE Deficits / Details: very mild weakness compared to LUE RUE Coordination: decreased fine motor   Lower Extremity Assessment Lower Extremity Assessment: Defer to PT evaluation   Cervical / Trunk Assessment Cervical / Trunk Assessment: Normal   Communication Communication Communication: Impaired Factors Affecting Communication: Reduced clarity of speech;Difficulty expressing self   Cognition Arousal: Alert Behavior During Therapy: WFL for tasks assessed/performed Cognition: No apparent impairments             OT - Cognition Comments: no errors on SBT                 Following commands: Intact       Cueing  General Comments   Cueing Techniques: Verbal cues  VSS on RA   Exercises     Shoulder Instructions      Home Living Family/patient expects to be discharged to:: Private residence Living Arrangements: Spouse/significant other Available Help at Discharge: Family;Available 24 hours/day Type of Home: House Home Access: Stairs to enter [employer] of Steps: 1 Entrance Stairs-Rails: None Home Layout: Multi-level;Able to live on main level with bedroom/bathroom     Bathroom Shower/Tub: Producer, television/film/video: Handicapped height     Home Equipment: Cane - single point;Wheelchair - manual;Standard Walker;Grab bars - tub/shower          Prior Functioning/Environment Prior Level of Function : Independent/Modified Independent;Driving;Working/employed             Mobility Comments: No AD ADLs Comments: Drives a lot for work, 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    OT Problem List: Impaired balance (sitting and/or standing);Decreased coordination   OT Treatment/Interventions: Self-care/ADL training;Therapeutic exercise;Neuromuscular education;Energy conservation;DME and/or  AE instruction;Therapeutic activities;Patient/family education;Balance training      OT Goals(Current goals can be found in the care plan section)   Acute Rehab OT Goals Patient Stated Goal: to go home OT Goal Formulation: With patient Time For Goal Achievement: 05/27/24 Potential to Achieve Goals: Good ADL Goals Pt Will Perform Grooming: Independently;standing Pt Will Perform Tub/Shower Transfer: Shower transfer;Independently;ambulating Pt/caregiver will Perform Home Exercise Program: Both right and left upper extremity;With written HEP provided;With Supervision Additional ADL Goal #1: pt will perform standing functional task x15 min in order to improve balance for standing ADLs   OT Frequency:  Min 1X/week    Co-evaluation              AM-PAC OT 6 Clicks Daily Activity     Outcome Measure Help from another person eating meals?: None Help from another person taking care of personal grooming?: None Help from another person toileting, which includes using toliet, bedpan, or urinal?: A Little Help from another person bathing (including washing, rinsing, drying)?: A Little Help from another person to put on and taking off regular upper body clothing?: A Little Help from another person to put on and taking off regular lower body clothing?: A Little 6 Click Score: 20   End of Session Nurse Communication: Mobility status  Activity Tolerance: Patient tolerated treatment well Patient left: in chair;with call bell/phone within reach;with chair alarm set  OT Visit Diagnosis: Muscle weakness (generalized) (M62.81);Cognitive communication deficit (R41.841) Symptoms and signs involving  cognitive functions: Cerebral infarction                Time: 1313-1330 OT Time Calculation (min): 17 min Charges:  OT General Charges $OT Visit: 1 Visit OT Evaluation $OT Eval Low Complexity: 1 Low  Teirra Carapia K, OTD, OTR/L SecureChat Preferred Acute Rehab [phone]   Jon Spencer 05/13/2024, 2:19 PM

## 2024-05-13 NOTE — Progress Notes (Addendum)
 STROKE TEAM PROGRESS NOTE   INTERIM HISTORY/SUBJECTIVE Family at the bedside.  Patient states that since Wednesday he has been having problems being unsteady and yesterday he developed problems with his speech.  He was given TNK Family states that his speech is still a little off  24-hour scan post TNK scheduled for this afternoon.  If negative for hemorrhage will start aspirin  and Plavix .  Discussed possible participation in CAPTIVA trial with patient and family and they have expressed interest and will be given information to review and decide CBC    Component Value Date/Time   WBC 9.6 05/12/2024 1403   RBC 4.85 05/12/2024 1403   HGB 14.5 05/12/2024 1403   HCT 43.7 05/12/2024 1403   PLT 264 05/12/2024 1403   MCV 90.1 05/12/2024 1403   MCH 29.9 05/12/2024 1403   MCHC 33.2 05/12/2024 1403   RDW 13.9 05/12/2024 1403   LYMPHSABS 2.0 05/12/2024 1403   MONOABS 0.5 05/12/2024 1403   EOSABS 0.1 05/12/2024 1403   BASOSABS 0.1 05/12/2024 1403    BMET    Component Value Date/Time   NA 139 05/12/2024 1403   K 4.2 05/12/2024 1403   CL 105 05/12/2024 1403   CO2 21 (L) 05/12/2024 1403   GLUCOSE 148 (H) 05/12/2024 1403   BUN 23 05/12/2024 1403   CREATININE 1.52 (H) 05/12/2024 1403   CALCIUM  9.2 05/12/2024 1403   GFRNONAA 48 (L) 05/12/2024 1403    IMAGING past 24 hours MR BRAIN WO CONTRAST Result Date: 05/12/2024 CLINICAL DATA:  Provided history: Stroke, follow-up. Slurred speech. Abnormal gait. Following administration of tPA. EXAM: MRI HEAD WITHOUT CONTRAST TECHNIQUE: Multiplanar, multiecho pulse sequences of the brain and surrounding structures were obtained without intravenous contrast. COMPARISON:  Non-contrast head CT and CT angiogram head/neck 05/12/2024. FINDINGS: Brain: Mild generalized cerebral atrophy. 15 mm acute infarct within the left aspect of the pons (series 5, image 69) (series 7, image 54). Small cortical/subcortical infarct within the posterior right frontal lobe. There  is diffusion-weighted signal hyperintensity at this site (with corresponding T2 shine through on the ADC map, and this infarct may be late subacute or chronic. Chronic lacunar infarcts within the left corona radiata and within/about the bilateral basal ganglia. Background multifocal T2 FLAIR hyperintense signal abnormality within the cerebral white matter, nonspecific but compatible with moderate-to-advanced chronic small vessel ischemic disease. Few nonspecific chronic microhemorrhages scattered within the left cerebral hemisphere. No evidence of an intracranial mass. No extra-axial fluid collection. No midline shift. Vascular: See CTA head/neck performed earlier today. Skull and upper cervical spine: No focal worrisome marrow lesion. Sinuses/Orbits: No mass or acute finding within the imaged orbits. No significant paranasal sinus disease. IMPRESSION: 1. 15 mm acute infarct within the left aspect of the pons. 2. Small cortical/subcortical infarct within the posterior right frontal lobe, late subacute or chronic. 3. Chronic lacunar infarcts within the left corona radiata and within/about the bilateral basal ganglia. 4. Background moderate-to-advanced cerebral white matter chronic small vessel ischemic disease. 5. Mild generalized cerebral atrophy. Electronically Signed   By: Rockey Childs D.O.   On: 05/12/2024 17:17   CT ANGIO HEAD NECK W WO CM (CODE STROKE) Result Date: 05/12/2024 CLINICAL DATA:  Acute neurological deficit. Dizziness and unsteady gait as well as slurred speech. EXAM: CT ANGIOGRAPHY HEAD AND NECK WITH CONTRAST TECHNIQUE: Multidetector CT imaging of the head and neck was performed using the standard protocol during bolus administration of intravenous contrast. Multiplanar CT image reconstructions and MIPs were obtained to evaluate the vascular anatomy.  Carotid stenosis measurements (when applicable) are obtained utilizing NASCET criteria, using the distal internal carotid diameter as the  denominator. RADIATION DOSE REDUCTION: This exam was performed according to the departmental dose-optimization program which includes automated exposure control, adjustment of the mA and/or kV according to patient size and/or use of iterative reconstruction technique. CONTRAST:  75mL OMNIPAQUE  IOHEXOL  350 MG/ML SOLN COMPARISON:  CT the head dated May 12, 2024. FINDINGS: CTA NECK FINDINGS Aortic arch: Mild calcific plaque. Three vessel takeoff of the great arteries. Right carotid system: The common carotid and internal carotid arteries are normal in caliber. There is mild calcific plaque within the carotid bulb, with no flow limiting stenosis. Left carotid system: The common carotid artery is normal in caliber. There is moderate calcific atheromatous disease within the carotid bulb and origin of the internal carotid artery, there is less than 20% stenosis of the origin. The remainder of the cervical segment of the internal carotid arteries normal in caliber. Vertebral arteries: Patent and normal in caliber throughout the respective courses. Skeleton: Mild-to-moderate cervical spondylosis. No osseous lesions. Other neck: Negative. Upper chest: Negative. Review of the MIP images confirms the above findings CTA HEAD FINDINGS Anterior circulation: There is extensive calcific plaque present within the carotid siphons and there appears to be at least moderate stenosis within the communicating segments of the internal carotid arteries bilaterally. There is atheromatous disease within the middle cerebral arteries bilaterally, but no flow-limiting stenosis. There is no evidence of large vessel occlusion or aneurysm. The anterior cerebral arteries and their branches are normal in caliber. Posterior circulation: There is moderate stenosis of the mid basilar artery, approximately 60%. There also appears to be high-grade stenosis of the P1 segment of the left posterior cerebral artery, which is demonstrated on image 108 of  series 6. The P2 segments are mildly irregular bilaterally, but show no flow limiting stenosis. A diminutive right posterior communicating artery is present. There is no definite left posterior communicating artery. The cerebellar arteries are patent. Venous sinuses: Patent.  The left transverse sinus is dominant. Anatomic variants: None. Review of the MIP images confirms the above findings IMPRESSION: 1. Near occlusive stenosis of the P1 segment of the left posterior cerebral artery. 2. Moderate stenosis of the midbasilar artery. 3. Moderate to severe stenosis of the communicating segments of the internal carotid arteries bilaterally. 4. Calcific plaque within the carotid bulbs bilaterally with less than 20% luminal stenosis bilaterally. These results were called by telephone at the time of interpretation on 05/12/2024 at 2:33 pm to provider Dr. ASHISH ARORA , who verbally acknowledged these results. Electronically Signed   By: Evalene Coho M.D.   On: 05/12/2024 14:47   CT HEAD CODE STROKE WO CONTRAST Result Date: 05/12/2024 CLINICAL DATA:  Code stroke.  Dizziness and unsteady gait. EXAM: CT HEAD WITHOUT CONTRAST TECHNIQUE: Contiguous axial images were obtained from the base of the skull through the vertex without intravenous contrast. RADIATION DOSE REDUCTION: This exam was performed according to the departmental dose-optimization program which includes automated exposure control, adjustment of the mA and/or kV according to patient size and/or use of iterative reconstruction technique. COMPARISON:  None Available. FINDINGS: Brain: Moderate generalized cerebral volume loss. Chronic lacunar infarcts within the left internal and external capsules. Moderate periventricular and deep cerebral white matter disease. No evidence of hemorrhage, mass or acute cortical infarct. Vascular: Moderate calcific atheromatous disease within the carotid siphons. Skull: Intact and unremarkable. Sinuses/Orbits: Clear paranasal  sinuses.  Normal orbits. Other: None. ASPECTS [facility] Stroke Program  Early CT Score) - Ganglionic level infarction (caudate, lentiform nuclei, internal capsule, insula, M1-M3 cortex): 7. - Supraganglionic infarction (M4-M6 cortex): 3. Total score (0-10 with 10 being normal): 10. IMPRESSION: 1. Age-related atrophy and moderately she advanced cerebral white matter disease. 2. ASPECTS is 10. 3. These results were communicated via the amion paging service at the time of interpretation on 05/12/2024 at 2:14 pm to provider Dr. Voncile. Electronically Signed   By: Evalene Coho M.D.   On: 05/12/2024 14:18    Vitals:   05/13/24 0600 05/13/24 0630 05/13/24 0700 05/13/24 0800  BP: 139/71 (!) 154/72 (!) 152/80   Pulse: 60 62 73   Resp:   19   Temp:    98 F (36.7 C)  TempSrc:    Oral  SpO2: 94% 94% 95%   Weight:      Height:         PHYSICAL EXAM General:  Alert, well-nourished, well-developed pleasant elderly Caucasian male in no acute distress Psych:  Mood and affect appropriate for situation CV: Regular rate and rhythm on monitor Respiratory:  Regular, unlabored respirations on room air GI: Abdomen soft and nontender   NEURO:  Mental Status: AA&Ox3, patient is able to give clear and coherent history Speech/Language: speech is without  aphasia.  Mild dysarthria.  Naming, repetition, fluency, and comprehension intact.  Cranial Nerves:  II: PERRL. Visual fields full.  III, IV, VI: EOMI. Eyelids elevate symmetrically.  V: Sensation is intact to light touch and symmetrical to face.  VII: Face is symmetrical resting and smiling VIII: hearing intact to voice. IX, X: Palate elevates symmetrically. Phonation is normal.  KP:Dynloizm shrug 5/5. XII: tongue is midline without fasciculations. Motor: 5/5 strength to all muscle groups tested.  Tone: is normal and bulk is normal Sensation- Intact to light touch bilaterally. Extinction absent to light touch to DSS.   Coordination: FTN intact  bilaterally, HKS: no ataxia in BLE.No drift.  Gait- deferred  Most Recent NIH   1a Level of Conscious.:  1b LOC Questions:  1c LOC Commands:  2 Best Gaze:  3 Visual:  4 Facial Palsy:  5a Motor Arm - left:  5b Motor Arm - Right:  6a Motor Leg - Left:  6b Motor Leg - Right:  7 Limb Ataxia:  8 Sensory:  9 Best Language:  10 Dysarthria: 1 11 Extinct. and Inatten.:  TOTAL: 1   ASSESSMENT/PLAN  Mr. Jon Spencer is a 73 y.o. male with history of  admitted for hypertension and hyperlipidemia who presented to the emergency department for sudden onset slurred speech.  Also had intermittent trouble with his gait since Wednesday.  He received TNK NIH on Admission 3  Acute Ischemic Infarct:  left pons, small cortical/subcortical infarct in posterior right frontal lobe s/p TNK Etiology: Large vessel disease Code Stroke CT head No acute abnormality. Small vessel disease. Atrophy. ASPECTS 10.    CTA head & neck Near occlusive stenosis of the P1 segment of the left posterior cerebral artery 2. Moderate stenosis of the midbasilar artery. 3. Moderate to severe stenosis of the communicating segments of the internal carotid arteries bilaterally. 4. Calcific plaque within the carotid bulbs bilaterally with less than 20% luminal stenosis bilaterally.  MRI  15 mm acute infarct within the left aspect of the pons. 2. Small cortical/subcortical infarct within the posterior right frontal lobe, late subacute or chronic.  Chronic lacunar infarcts within the left corona radiata and within/about the bilateral basal ganglia. 4. Background moderate-to-advanced cerebral white  matter chronic small vessel ischemic disease. 5. Mild generalized cerebral atrophy. 2D Echo EF 55 to 60%.  Left ventricle with grade 1 diastolic dysfunction LDL ordered HgbA1c ordered VTE prophylaxis - SCD's No antithrombotic prior to admission, now on no antithrombotics.  Will start aspirin  81 mg daily and clopidogrel  75 mg daily for 3  months and then Asprin  alone.  Once 24-hour brain imaging is negative for hemorrhage Therapy recommendations:  Pending Disposition: Pending  Hx of Stroke/TIA Chronic lacunar infarcts within the left corona radiata and within/about the bilateral basal ganglia on brain imaging  Hypertension Home meds: Amlodipine  10 mg, losartan  100 mg Stable Blood Pressure Goal: BP less than 180/105   Hyperlipidemia Home meds: None,  LDL ordered goal < 70 Add Crestor  10 mg High intensity statin not indicated  Continue statin at discharge   Dysphagia Patient has post-stroke dysphagia, SLP consulted    Diet   Diet regular Room service appropriate? Yes; Fluid consistency: Thin   Advance diet as tolerated  Other Stroke Risk Factors ETOH use, alcohol level <15, advised to drink no more than 2 drink(s) a day Obesity, Body mass index is 31.44 kg/m., BMI >/= 30 associated with increased stroke risk, recommend weight loss, diet and exercise as appropriate    Other Active Problems CKD-CR 1.52  Hospital day # 1   [name] Geralds DNP, ACNPC-AG  [facility]   I have personally obtained history,examined this patient, reviewed notes, independently viewed imaging studies, participated in medical decision making and plan of care.ROS completed by me personally and pertinent positives fully documented  I have made any additions or clarifications directly to the above note. Agree with note above.  Patient presented with sudden onset of slurred speech and gait difficulties due to left pontine stroke and was treated with IV TNK with good recovery.  CT angiogram suggests moderate to severe basilar artery stenosis which is symptomatic as well as severe left PCA stenosis which is asymptomatic.  Recommend close neurological observation with strict blood pressure control as per post TNK protocol.  Mobilize out of bed.  Therapy consults.  Continue ongoing stroke workup.  He will likely need dual antiplatelet  therapy for 3 months then aspirin  alone and aggressive risk factor modification.  Patient may also consider possible participation in the CAPTIVA stroke prevention study and was given information to review and decide.   Long discussion with patient and multiple family members at the bedside and answered questions. This patient is critically ill and at significant risk of neurological worsening, death and care requires constant monitoring of vital signs, hemodynamics,respiratory and cardiac monitoring, extensive review of multiple databases, frequent neurological assessment, discussion with family, other specialists and medical decision making of high complexity.I have made any additions or clarifications directly to the above note.This critical care time does not reflect procedure time, or teaching time or supervisory time of PA/NP/Med Resident etc but could involve care discussion time.  I spent 30 minutes of neurocritical care time  in the care of  this patient.     Eather Popp, MD Medical Director [facility] Pager: [phone] 05/13/2024 3:24 PM   To contact Stroke Continuity provider, please refer to [email or web address]. After hours, contact General Neurology

## 2024-05-13 NOTE — Progress Notes (Signed)
 Phone report called to 3 [facility] staff. They are ready to receive PT whenever convenient.

## 2024-05-13 NOTE — Progress Notes (Signed)
 PT Cancellation Note  Patient Details Name: Jon Spencer MRN: [id number] DOB: April 20, 1951   Cancelled Treatment:    Reason Eval/Treat Not Completed: (P) Active bedrest order. Coordinated with RN to notify PT if pt comes off bedrest this date. Will plan to follow-up as able.   Theo Ferretti, PT, DPT Acute Rehabilitation Services  Office: [phone]    Theo CHRISTELLA Ferretti 05/13/2024, 8:22 AM

## 2024-05-13 NOTE — Evaluation (Signed)
 Physical Therapy Evaluation Patient Details Name: Jon Spencer MRN: [id number] DOB: [birth year] Today's Date: 05/13/2024  History of Present Illness  Pt is a 73 y.o. male who presented 05/12/24 with sudden onset slurred speech. Pt was also noted to have intermittent shuffling gait for ~3 days PTA. CT head revealed no acute abnormality, but CT angiogram revealed near occlusive stenosis of P1 segment of left PCA and moderate stenosis of mid basilar artery. Pt received TNK. MRI showed acute infarct within the L pons, late subacute or chronic small cortical/subcortical infarct within the posterior R frontal lobe, and chronic lacunar infarcts within the L corona radiata and  within/about the bilateral basal ganglia. PMH: ADD, HLD, HTN, tinnitus, trigger finger   Clinical Impression  Pt presents with condition above and deficits mentioned below, see PT Problem List. PTA, he was independent without DME, working, driving, and living with his wife in a 3-level house with 1 STE. His bedroom is on the main level. Currently, the pt is demonstrating deficits in speech clarity and word finding capabilities along with deficits in R UE fine coordination and bil lower extremity functional coordination. His proprioception, sensation, and strength appear intact, WFL, and symmetrical in bil upper and lower extremities. When standing, pt is a bit shaky and ambulates slowly with a shuffling gait pattern. However, when cued to increase speed and step length, he was able to do so. He does sway/stagger intermittently, but is capable of ambulating without DME with only CGA for safety. He has good support available to him at d/c. He could benefit from neuro OPPT follow-up. Will continue to follow acutely.        If plan is discharge home, recommend the following: Assistance with cooking/housework;Assist for transportation;Help with stairs or ramp for entrance   Can travel by private vehicle        Equipment Recommendations  None recommended by PT  Recommendations for Other Services       Functional Status Assessment Patient has had a recent decline in their functional status and demonstrates the ability to make significant improvements in function in a reasonable and predictable amount of time.     Precautions / Restrictions Precautions Precautions: Fall Restrictions Weight Bearing Restrictions Per Provider Order: No      Mobility  Bed Mobility Overal bed mobility: Needs Assistance Bed Mobility: Supine to Sit     Supine to sit: Supervision, HOB elevated     General bed mobility comments: Extra time, supervision for safety, HOB elevated    Transfers Overall transfer level: Needs assistance Equipment used: None Transfers: Sit to/from Stand Sit to Stand: Contact guard assist           General transfer comment: Pt transferred to stand from EOB 3x, slow to power up to stand and pt noted to be shaky, but no LOB, CGA for safety    Ambulation/Gait Ambulation/Gait assistance: Contact guard assist, Min assist Gait Distance (Feet): 320 Feet Assistive device: None Gait Pattern/deviations: Step-through pattern, Decreased step length - right, Decreased step length - left, Decreased stride length, Shuffle Gait velocity: reduced Gait velocity interpretation: >2.62 ft/sec, indicative of community ambulatory (when cued to increase speed)   General Gait Details: Pt initially required minA for balance but quickly progressed to CGA after ~15 ft. He initially ambulated slowly, shakily, with a shuffling gait pattern and intermittent trunk sway. VCs provided to increase step length bil and speed with noted good success and carryover. When cued to turn head L <> R pt did  stagger slightly 1x but then recovered on his own with CGA. Pt also took a few steps to regain his balance after a sudden stop, CGA. Pt able to increase speed without LOB.  Stairs            Wheelchair Mobility     Tilt Bed     Modified Rankin (Stroke Patients Only) Modified Rankin (Stroke Patients Only) Pre-Morbid Rankin Score: No symptoms Modified Rankin: Moderately severe disability     Balance Overall balance assessment: Needs assistance Sitting-balance support: Feet supported, No upper extremity supported Sitting balance-Leahy Scale: Good Sitting balance - Comments: able to reach off COG to donn underwear and pants sitting EOB, supervision for safety   Standing balance support: No upper extremity supported, During functional activity Standing balance-Leahy Scale: Fair Standing balance comment: shakiness noted in standing with intermittent sway/stagger when ambulating, CGA for safety                             Pertinent Vitals/Pain Pain Assessment Pain Assessment: No/denies pain    Home Living Family/patient expects to be discharged to:: Private residence Living Arrangements: Spouse/significant other Available Help at Discharge: Family;Available 24 hours/day Type of Home: House Home Access: Stairs to enter Entrance Stairs-Rails: None Entrance Stairs-Number of Steps: 1   Home Layout: Multi-level;Able to live on main level with bedroom/bathroom Home Equipment: [place] - single point;Wheelchair - manual;Standard Walker;Grab bars - tub/shower      Prior Function Prior Level of Function : Independent/Modified Independent;Driving;Working/employed             Mobility Comments: No AD ADLs Comments: Drives a lot for work, 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 [employer]     Extremity/Trunk Assessment   Upper Extremity Assessment Upper Extremity Assessment: Defer to OT evaluation;Left hand dominant;RUE deficits/detail (MMT scores of 5/5 grossly bil; sensation intact bil; static proprioception intact bil; coordination intact on L) RUE Deficits / Details: noted some mild incoordination with tapping each finger to his thumb, overshooting with his ring finger  repeatedly and tapping multiple times with some fingers before progressing to the next finger RUE Coordination: decreased fine motor    Lower Extremity Assessment Lower Extremity Assessment: RLE deficits/detail;LLE deficits/detail RLE Deficits / Details: MMT scores of 5 grossly bil; sensation intact bil; dynamic proprioception intact bil; coordination intact bil with formal testing but noted incoordination with functional mobility RLE Coordination: decreased gross motor LLE Deficits / Details: MMT scores of 5 grossly bil; sensation intact bil; dynamic proprioception intact bil; coordination intact bil with formal testing but noted incoordination with functional mobility LLE Coordination: decreased gross motor    Cervical / Trunk Assessment Cervical / Trunk Assessment: Normal  Communication   Communication Communication: Impaired Factors Affecting Communication: Reduced clarity of speech;Difficulty expressing self    Cognition Arousal: Alert Behavior During Therapy: WFL for tasks assessed/performed   PT - Cognitive impairments: No apparent impairments                       PT - Cognition Comments: may need further assessment though, but pt followed all commands appropriately this session Following commands: Intact       Cueing Cueing Techniques: Verbal cues     General Comments General comments (skin integrity, edema, etc.): VSS on RA    Exercises     Assessment/Plan    PT Assessment Patient needs continued PT services  PT Problem List Decreased  activity tolerance;Decreased balance;Decreased mobility;Decreased coordination       PT Treatment Interventions DME instruction;Gait training;Stair training;Functional mobility training;Therapeutic activities;Therapeutic exercise;Balance training;Neuromuscular re-education;Patient/family education    PT Goals (Current goals can be found in the Care Plan section)  Acute Rehab PT Goals Patient Stated Goal: to  improve PT Goal Formulation: With patient/family Time For Goal Achievement: 05/27/24 Potential to Achieve Goals: Good    Frequency Min 2X/week     Co-evaluation               AM-PAC PT 6 Clicks Mobility  Outcome Measure Help needed turning from your back to your side while in a flat bed without using bedrails?: A Little Help needed moving from lying on your back to sitting on the side of a flat bed without using bedrails?: A Little Help needed moving to and from a bed to a chair (including a wheelchair)?: A Little Help needed standing up from a chair using your arms (e.g., wheelchair or bedside chair)?: A Little Help needed to walk in hospital room?: A Little Help needed climbing 3-5 steps with a railing? : A Little 6 Click Score: 18    End of Session Equipment Utilized During Treatment: Gait belt Activity Tolerance: Patient tolerated treatment well Patient left: in chair;with call bell/phone within reach;with chair alarm set;with family/visitor present Nurse Communication: Mobility status PT Visit Diagnosis: Unsteadiness on feet (R26.81);Other abnormalities of gait and mobility (R26.89);Difficulty in walking, not elsewhere classified (R26.2);Other symptoms and signs involving the nervous system (R29.898)    Time: 9047-8984 PT Time Calculation (min) (ACUTE ONLY): 23 min   Charges:   PT Evaluation $PT Eval Moderate Complexity: 1 Mod PT Treatments $Gait Training: 8-22 mins PT General Charges $$ ACUTE PT VISIT: 1 Visit         Theo Ferretti, PT, DPT Acute Rehabilitation Services  Office: [phone]   Theo CHRISTELLA Ferretti 05/13/2024, 10:30 AM

## 2024-05-14 DIAGNOSIS — N189 Chronic kidney disease, unspecified: Secondary | ICD-10-CM | POA: Insufficient documentation

## 2024-05-14 DIAGNOSIS — R29701 NIHSS score 1: Secondary | ICD-10-CM | POA: Diagnosis not present

## 2024-05-14 DIAGNOSIS — E669 Obesity, unspecified: Secondary | ICD-10-CM | POA: Insufficient documentation

## 2024-05-14 DIAGNOSIS — I6389 Other cerebral infarction: Secondary | ICD-10-CM | POA: Diagnosis not present

## 2024-05-14 DIAGNOSIS — R1310 Dysphagia, unspecified: Secondary | ICD-10-CM

## 2024-05-14 MED ORDER — CLOPIDOGREL BISULFATE 75 MG PO TABS: 75.0000 mg | ORAL_TABLET | Freq: Every day | ORAL | 0 refills | Status: DC

## 2024-05-14 MED ORDER — PANTOPRAZOLE SODIUM 40 MG PO TBEC: 40.0000 mg | DELAYED_RELEASE_TABLET | Freq: Every day | ORAL | Status: DC

## 2024-05-14 MED ORDER — CLOPIDOGREL BISULFATE 75 MG PO TABS
75.0000 mg | ORAL_TABLET | Freq: Every day | ORAL | Status: DC
  Administered 2024-05-14: 75 mg via ORAL
  Filled 2024-05-14: qty 1

## 2024-05-14 MED ORDER — ASPIRIN 81 MG PO TBEC: 81.0000 mg | DELAYED_RELEASE_TABLET | Freq: Every day | ORAL | 12 refills | Status: AC

## 2024-05-14 MED ORDER — ROSUVASTATIN CALCIUM 10 MG PO TABS: 10.0000 mg | ORAL_TABLET | Freq: Every day | ORAL | 0 refills | Status: DC

## 2024-05-14 NOTE — Discharge Summary (Addendum)
 Stroke Discharge Summary  Patient ID: Jon Spencer   MRN: [id number]      DOB: 1951/01/06  Date of Admission: 05/12/2024 Date of Discharge: 05/14/2024  Attending Physician:  Stroke, Md, MD Consultant(s):    None  Patient's PCP:  Micheal Wolm ORN, MD  DISCHARGE PRIMARY DIAGNOSIS:   Acute Ischemic Infarct:  left pons, and subacute small cortical/subcortical infarct in posterior right frontal lobe s/p TNK Etiology: Symptomatic basilar artery stenosis Patient Active Problem List   Diagnosis Date Noted   Dysphagia 05/14/2024   CKD (chronic kidney disease) 05/14/2024   Obesity 05/14/2024   Stroke determined by clinical assessment (HCC) 05/12/2024   Acute ischemic stroke (HCC) 05/12/2024   Aortic valve stenosis 05/05/2023   SOB (shortness of breath) 05/05/2023   Ganglion cyst of flexor tendon sheath of finger of left hand 04/29/2021   De Quervain's tenosynovitis, right 04/25/2020   Schwannoma 07/12/2018   Hypogonadism male 04/10/2011   IMPOTENCE OF ORGANIC ORIGIN 10/01/2010   Osteoarthrosis, hand 04/17/2010   Attention deficit disorder 01/17/2010   Tinnitus 06/26/2009   Hyperlipidemia 01/18/2009   Essential hypertension 01/18/2009   Trigger finger, acquired Basilar artery stenosis 01/18/2009     Allergies as of 05/14/2024       Reactions   Tolectin [tolmetin Sodium] Other (See Comments)   Fever 104, breathing problems   Meloxicam Other (See Comments)   REACTION: Flushed, high temp   Univasc [moexipril Hydrochloride] Cough   coughing        Medication List     TAKE these medications    albuterol  108 (90 Base) MCG/ACT inhaler Commonly known as: VENTOLIN  HFA Inhale 2 puffs into the lungs every 6 (six) hours as needed for wheezing or shortness of breath.   alfuzosin  10 MG 24 hr tablet Commonly known as: UROXATRAL  Take 10 mg by mouth daily.   amLODipine  10 MG tablet Commonly known as: NORVASC  TAKE 1 TABLET BY MOUTH EVERY DAY   aspirin  EC 81 MG  tablet Take 1 tablet (81 mg total) by mouth daily. Swallow whole. Start taking on: May 15, 2024   clopidogrel  75 MG tablet Commonly known as: PLAVIX  Take 1 tablet (75 mg total) by mouth daily. Start taking on: May 15, 2024   gabapentin  300 MG capsule Commonly known as: NEURONTIN  TAKE 1 CAPSULE BY MOUTH TWICE A DAY What changed: when to take this   losartan  100 MG tablet Commonly known as: COZAAR  TAKE 1 TABLET BY MOUTH EVERY DAY   mirabegron  ER 50 MG Tb24 tablet Commonly known as: MYRBETRIQ  TAKE 1 TABLET BY MOUTH EVERY DAY   Multivitamin Adult Chew Chew 1 each by mouth daily.   rosuvastatin  10 MG tablet Commonly known as: CRESTOR  Take 1 tablet (10 mg total) by mouth daily. Start taking on: May 15, 2024   sildenafil  100 MG tablet Commonly known as: Viagra  Take 1 tablet (100 mg total) by mouth daily as needed for erectile dysfunction.   testosterone  cypionate 200 MG/ML injection Commonly known as: DEPOTESTOSTERONE CYPIONATE SMARTSIG:0.3 Milliliter(s) IM Twice a Week        LABORATORY STUDIES CBC    Component Value Date/Time   WBC 9.6 05/12/2024 1403   RBC 4.85 05/12/2024 1403   HGB 14.5 05/12/2024 1403   HCT 43.7 05/12/2024 1403   PLT 264 05/12/2024 1403   MCV 90.1 05/12/2024 1403   MCH 29.9 05/12/2024 1403   MCHC 33.2 05/12/2024 1403   RDW 13.9 05/12/2024 1403   LYMPHSABS 2.0  05/12/2024 1403   MONOABS 0.5 05/12/2024 1403   EOSABS 0.1 05/12/2024 1403   BASOSABS 0.1 05/12/2024 1403   CMP    Component Value Date/Time   NA 139 05/12/2024 1403   K 4.2 05/12/2024 1403   CL 105 05/12/2024 1403   CO2 21 (L) 05/12/2024 1403   GLUCOSE 148 (H) 05/12/2024 1403   BUN 23 05/12/2024 1403   CREATININE 1.52 (H) 05/12/2024 1403   CALCIUM  9.2 05/12/2024 1403   PROT 6.5 05/12/2024 1403   ALBUMIN 4.0 05/12/2024 1403   AST 28 05/12/2024 1403   ALT 17 05/12/2024 1403   ALKPHOS 71 05/12/2024 1403   BILITOT 0.4 05/12/2024 1403   GFRNONAA 48 (L) 05/12/2024 1403    GFRAA >60 06/29/2018 0843   COAGS Lab Results  Component Value Date   INR 1.0 05/12/2024   Lipid Panel    Component Value Date/Time   CHOL 206 (H) 05/13/2024 1044   TRIG 114 05/13/2024 1044   HDL 43 05/13/2024 1044   CHOLHDL 4.8 05/13/2024 1044   VLDL 23 05/13/2024 1044   LDLCALC 140 (H) 05/13/2024 1044   HgbA1C No results found for: HGBA1C Alcohol Level    Component Value Date/Time   [facility] <15 05/12/2024 1402     SIGNIFICANT DIAGNOSTIC STUDIES CT HEAD WO CONTRAST ( ) Result Date: 05/13/2024 CLINICAL DATA:  Stroke, follow up 24 hours post TNK APPROX.(1445 on 7/12) EXAM: CT HEAD WITHOUT CONTRAST TECHNIQUE: Contiguous axial images were obtained from the base of the skull through the vertex without intravenous contrast. RADIATION DOSE REDUCTION: This exam was performed according to the departmental dose-optimization program which includes automated exposure control, adjustment of the mA and/or kV according to patient size and/or use of iterative reconstruction technique. COMPARISON:  MRI of the head dated May 12, 2024. FINDINGS: Brain: Age-related atrophy and moderate periventricular and deep cerebral white matter disease. Chronic lacunar infarcts within the left basal ganglia and right external capsule. No evidence of hemorrhage, mass or hydrocephalus. Vascular: Moderate calcifications within the carotid siphons and vertebral arteries. Skull: Intact and unremarkable. Sinuses/Orbits: Clear paranasal nasal sinuses.  Normal orbits. Other: None. IMPRESSION: 1. Age-related atrophy and moderately advanced ischemic small vessel disease. Electronically Signed   By: Evalene Coho M.D.   On: 05/13/2024 16:37   ECHOCARDIOGRAM COMPLETE Result Date: 05/13/2024    ECHOCARDIOGRAM REPORT   Patient Name:   Jon Spencer Date of Exam: 05/13/2024 Medical Rec #:  [id number]      Height:       68.0 in Accession #:    [id number]     Weight:       206.8 lb Date of Birth:  07-11-1951     BSA:           2.073 m Patient Age:    72 years       BP:           151/78 mmHg Patient Gender: M              HR:           79 bpm. Exam Location:  Inpatient Procedure: 2D Echo, Cardiac Doppler and Color Doppler (Both Spectral and Color            Flow Doppler were utilized during procedure). Indications:    Stroke  History:        Patient has prior history of Echocardiogram examinations.  Stroke; Aortic Valve Disease.  Sonographer:    Vella Key Referring Phys: [id number] Jon ARORA IMPRESSIONS  1. Left ventricular ejection fraction, by estimation, is 55 to 60%. The left ventricle has normal function. The left ventricle has no regional wall motion abnormalities. Left ventricular diastolic parameters are consistent with Grade I diastolic dysfunction (impaired relaxation).  2. Right ventricular systolic function is normal. The right ventricular size is normal.  3. The mitral valve is abnormal. Trivial mitral valve regurgitation. No evidence of mitral stenosis.  4. Gradients similar to TTE done 03/23/23 DVI 0.40 AVA calculated by tech wrong she used LVOT diameter of only 1.1 LVOT measured by me 2.1 which would calculate and AVA of 1.2 cm2 similar to prior . The aortic valve is tricuspid. There is severe calcifcation of the aortic valve. There is severe thickening of the aortic valve. Aortic valve regurgitation is not visualized. Moderate aortic valve stenosis.  5. The inferior vena cava is normal in size with greater than 50% respiratory variability, suggesting right atrial pressure of 3 mmHg. FINDINGS  Left Ventricle: Left ventricular ejection fraction, by estimation, is 55 to 60%. The left ventricle has normal function. The left ventricle has no regional wall motion abnormalities. Strain was performed and the global longitudinal strain is indeterminate. The left ventricular internal cavity size was normal in size. There is no left ventricular hypertrophy. Left ventricular diastolic parameters are consistent with  Grade I diastolic dysfunction (impaired relaxation). Right Ventricle: The right ventricular size is normal. No increase in right ventricular wall thickness. Right ventricular systolic function is normal. Left Atrium: Left atrial size was normal in size. Right Atrium: Right atrial size was normal in size. Pericardium: There is no evidence of pericardial effusion. Mitral Valve: The mitral valve is abnormal. There is mild thickening of the mitral valve leaflet(s). There is mild calcification of the mitral valve leaflet(s). Mild mitral annular calcification. Trivial mitral valve regurgitation. No evidence of mitral valve stenosis. Tricuspid Valve: The tricuspid valve is normal in structure. Tricuspid valve regurgitation is not demonstrated. No evidence of tricuspid stenosis. Aortic Valve: Gradients similar to TTE done 03/23/23 DVI 0.40 AVA calculated by tech wrong she used LVOT diameter of only 1.1 LVOT measured by me 2.1 which would calculate and AVA of 1.2 cm2 similar to prior. The aortic valve is tricuspid. There is severe  calcifcation of the aortic valve. There is severe thickening of the aortic valve. Aortic valve regurgitation is not visualized. Moderate aortic stenosis is present. Aortic valve mean gradient measures 21.0 mmHg. Aortic valve peak gradient measures 32.5 mmHg. Aortic valve area, by VTI measures 0.79 cm. Pulmonic Valve: The pulmonic valve was normal in structure. Pulmonic valve regurgitation is not visualized. No evidence of pulmonic stenosis. Aorta: The aortic root is normal in size and structure. Venous: The inferior vena cava is normal in size with greater than 50% respiratory variability, suggesting right atrial pressure of 3 mmHg. IAS/Shunts: No atrial level shunt detected by color flow Doppler. Additional Comments: 3D was performed not requiring image post processing on an independent workstation and was indeterminate.  LEFT VENTRICLE PLAX 2D LVIDd:         3.40 cm      Diastology LVIDs:          3.15 cm      LV e' medial:    7.72 cm/s LV PW:         1.60 cm      LV E/e' medial:  7.3 LV IVS:  1.60 cm      LV e' lateral:   7.07 cm/s LVOT diam:     1.59 cm      LV E/e' lateral: 8.0 LV SV:         50 LV SV Index:   24 LVOT Area:     1.99 cm  LV Volumes (MOD) LV vol d, MOD A2C: 84.5 ml LV vol d, MOD A4C: 101.0 ml LV vol s, MOD A2C: 27.4 ml LV vol s, MOD A4C: 36.7 ml LV SV MOD A2C:     57.1 ml LV SV MOD A4C:     101.0 ml LV SV MOD BP:      62.4 ml RIGHT VENTRICLE RV Basal diam:  2.80 cm RV S prime:     14.80 cm/s TAPSE (M-mode): 2.0 cm LEFT ATRIUM             Index        RIGHT ATRIUM           Index LA diam:        3.60 cm 1.74 cm/m   RA Area:     10.30 cm LA Vol (A2C):   41.6 ml 20.07 ml/m  RA Volume:   22.40 ml  10.81 ml/m LA Vol (A4C):   50.5 ml 24.36 ml/m LA Biplane Vol: 47.9 ml 23.11 ml/m  AORTIC VALVE AV Area (Vmax):    0.74 cm AV Area (Vmean):   0.69 cm AV Area (VTI):     0.79 cm AV Vmax:           285.00 cm/s AV Vmean:          219.000 cm/s AV VTI:            0.636 m AV Peak Grad:      32.5 mmHg AV Mean Grad:      21.0 mmHg LVOT Vmax:         106.00 cm/s LVOT Vmean:        76.400 cm/s LVOT VTI:          0.252 m LVOT/AV VTI ratio: 0.40  AORTA Ao Root diam: 2.80 cm Ao Asc diam:  3.20 cm MITRAL VALVE MV Area (PHT): 3.79 cm    SHUNTS MV Decel Time: 200 msec    Systemic VTI:  0.25 m MV E velocity: 56.60 cm/s  Systemic Diam: 1.59 cm MV A velocity: 99.60 cm/s MV E/A ratio:  0.57 Maude Emmer MD Electronically signed by Maude Emmer MD Signature Date/Time: 05/13/2024/11:12:01 AM    Final    MR BRAIN WO CONTRAST Result Date: 05/12/2024 CLINICAL DATA:  Provided history: Stroke, follow-up. Slurred speech. Abnormal gait. Following administration of tPA. EXAM: MRI HEAD WITHOUT CONTRAST TECHNIQUE: Multiplanar, multiecho pulse sequences of the brain and surrounding structures were obtained without intravenous contrast. COMPARISON:  Non-contrast head CT and CT angiogram head/neck 05/12/2024.  FINDINGS: Brain: Mild generalized cerebral atrophy. 15 mm acute infarct within the left aspect of the pons (series 5, image 69) (series 7, image 54). Small cortical/subcortical infarct within the posterior right frontal lobe. There is diffusion-weighted signal hyperintensity at this site (with corresponding T2 shine through on the ADC map, and this infarct may be late subacute or chronic. Chronic lacunar infarcts within the left corona radiata and within/about the bilateral basal ganglia. Background multifocal T2 FLAIR hyperintense signal abnormality within the cerebral white matter, nonspecific but compatible with moderate-to-advanced chronic small vessel ischemic disease. Few nonspecific chronic microhemorrhages scattered within the left cerebral hemisphere. No  evidence of an intracranial mass. No extra-axial fluid collection. No midline shift. Vascular: See CTA head/neck performed earlier today. Skull and upper cervical spine: No focal worrisome marrow lesion. Sinuses/Orbits: No mass or acute finding within the imaged orbits. No significant paranasal sinus disease. IMPRESSION: 1. 15 mm acute infarct within the left aspect of the pons. 2. Small cortical/subcortical infarct within the posterior right frontal lobe, late subacute or chronic. 3. Chronic lacunar infarcts within the left corona radiata and within/about the bilateral basal ganglia. 4. Background moderate-to-advanced cerebral white matter chronic small vessel ischemic disease. 5. Mild generalized cerebral atrophy. Electronically Signed   By: Rockey Childs D.O.   On: 05/12/2024 17:17   CT ANGIO HEAD NECK W WO CM (CODE STROKE) Result Date: 05/12/2024 CLINICAL DATA:  Acute neurological deficit. Dizziness and unsteady gait as well as slurred speech. EXAM: CT ANGIOGRAPHY HEAD AND NECK WITH CONTRAST TECHNIQUE: Multidetector CT imaging of the head and neck was performed using the standard protocol during bolus administration of intravenous contrast.  Multiplanar CT image reconstructions and MIPs were obtained to evaluate the vascular anatomy. Carotid stenosis measurements (when applicable) are obtained utilizing NASCET criteria, using the distal internal carotid diameter as the denominator. RADIATION DOSE REDUCTION: This exam was performed according to the departmental dose-optimization program which includes automated exposure control, adjustment of the mA and/or kV according to patient size and/or use of iterative reconstruction technique. CONTRAST:  75mL OMNIPAQUE  IOHEXOL  350 MG/ML SOLN COMPARISON:  CT the head dated May 12, 2024. FINDINGS: CTA NECK FINDINGS Aortic arch: Mild calcific plaque. Three vessel takeoff of the great arteries. Right carotid system: The common carotid and internal carotid arteries are normal in caliber. There is mild calcific plaque within the carotid bulb, with no flow limiting stenosis. Left carotid system: The common carotid artery is normal in caliber. There is moderate calcific atheromatous disease within the carotid bulb and origin of the internal carotid artery, there is less than 20% stenosis of the origin. The remainder of the cervical segment of the internal carotid arteries normal in caliber. Vertebral arteries: Patent and normal in caliber throughout the respective courses. Skeleton: Mild-to-moderate cervical spondylosis. No osseous lesions. Other neck: Negative. Upper chest: Negative. Review of the MIP images confirms the above findings CTA HEAD FINDINGS Anterior circulation: There is extensive calcific plaque present within the carotid siphons and there appears to be at least moderate stenosis within the communicating segments of the internal carotid arteries bilaterally. There is atheromatous disease within the middle cerebral arteries bilaterally, but no flow-limiting stenosis. There is no evidence of large vessel occlusion or aneurysm. The anterior cerebral arteries and their branches are normal in caliber. Posterior  circulation: There is moderate stenosis of the mid basilar artery, approximately 60%. There also appears to be high-grade stenosis of the P1 segment of the left posterior cerebral artery, which is demonstrated on image 108 of series 6. The P2 segments are mildly irregular bilaterally, but show no flow limiting stenosis. A diminutive right posterior communicating artery is present. There is no definite left posterior communicating artery. The cerebellar arteries are patent. Venous sinuses: Patent.  The left transverse sinus is dominant. Anatomic variants: None. Review of the MIP images confirms the above findings IMPRESSION: 1. Near occlusive stenosis of the P1 segment of the left posterior cerebral artery. 2. Moderate stenosis of the midbasilar artery. 3. Moderate to severe stenosis of the communicating segments of the internal carotid arteries bilaterally. 4. Calcific plaque within the carotid bulbs bilaterally with less than 20%  luminal stenosis bilaterally. These results were called by telephone at the time of interpretation on 05/12/2024 at 2:33 pm to provider Dr. ASHISH ARORA , who verbally acknowledged these results. Electronically Signed   By: Evalene Coho M.D.   On: 05/12/2024 14:47   CT HEAD CODE STROKE WO CONTRAST Result Date: 05/12/2024 CLINICAL DATA:  Code stroke.  Dizziness and unsteady gait. EXAM: CT HEAD WITHOUT CONTRAST TECHNIQUE: Contiguous axial images were obtained from the base of the skull through the vertex without intravenous contrast. RADIATION DOSE REDUCTION: This exam was performed according to the departmental dose-optimization program which includes automated exposure control, adjustment of the mA and/or kV according to patient size and/or use of iterative reconstruction technique. COMPARISON:  None Available. FINDINGS: Brain: Moderate generalized cerebral volume loss. Chronic lacunar infarcts within the left internal and external capsules. Moderate periventricular and deep  cerebral white matter disease. No evidence of hemorrhage, mass or acute cortical infarct. Vascular: Moderate calcific atheromatous disease within the carotid siphons. Skull: Intact and unremarkable. Sinuses/Orbits: Clear paranasal sinuses.  Normal orbits. Other: None. ASPECTS [facility] Stroke Program Early CT Score) - Ganglionic level infarction (caudate, lentiform nuclei, internal capsule, insula, M1-M3 cortex): 7. - Supraganglionic infarction (M4-M6 cortex): 3. Total score (0-10 with 10 being normal): 10. IMPRESSION: 1. Age-related atrophy and moderately she advanced cerebral white matter disease. 2. ASPECTS is 10. 3. These results were communicated via the amion paging service at the time of interpretation on 05/12/2024 at 2:14 pm to provider Dr. Voncile. Electronically Signed   By: Evalene Coho M.D.   On: 05/12/2024 14:18       HISTORY OF PRESENT ILLNESS 73 y.o. patient with history of  hypertension and hyperlipidemia who presented to the emergency department for sudden onset slurred speech.  Also had intermittent trouble with his gait since Wednesday.  He received TNK NIH on Admission 3   HOSPITAL COURSE Acute Ischemic Infarct:  left pons, small cortical/subcortical infarct in posterior right frontal lobe s/p TNK Etiology: Large vessel disease Code Stroke CT head No acute abnormality. Small vessel disease. Atrophy. ASPECTS 10.    CTA head & neck Near occlusive stenosis of the P1 segment of the left posterior cerebral artery 2. Moderate stenosis of the midbasilar artery. 3. Moderate to severe stenosis of the communicating segments of the internal carotid arteries bilaterally. 4. Calcific plaque within the carotid bulbs bilaterally with less than 20% luminal stenosis bilaterally.  MRI  15 mm acute infarct within the left aspect of the pons. 2. Small cortical/subcortical infarct within the posterior right frontal lobe, late subacute or chronic.  Chronic lacunar infarcts within the left corona radiata  and within/about the bilateral basal ganglia. 4. Background moderate-to-advanced cerebral white matter chronic small vessel ischemic disease. 5. Mild generalized cerebral atrophy. 2D Echo EF 55 to 60%.  Left ventricle with grade 1 diastolic dysfunction LDL 140 HgbA1c pending No antithrombotic prior to admission, now aspirin  81 mg daily and clopidogrel  75 mg daily for 3 months and then Asprin  alone.     Hx of Stroke/TIA Chronic lacunar infarcts within the left corona radiata and within/about the bilateral basal ganglia on brain imaging   Hypertension Home meds: Amlodipine  10 mg, losartan  100 mg Stable Blood Pressure Goal: 130/90   Hyperlipidemia Home meds: None,  LDL 140 goal < 70 Add Crestor  10 mg Continue statin at discharge    Dysphagia Patient has post-stroke dysphagia, SLP consulted Advance diet as tolerated   Other Stroke Risk Factors ETOH use, alcohol level <15,  advised to drink no more than 2 drink(s) a day Obesity, Body mass index is 31.44 kg/m., BMI >/= 30 associated with increased stroke risk, recommend weight loss, diet and exercise as appropriate      Other Active Problems CKD-CR 1.52  DISCHARGE EXAM  PHYSICAL EXAM General:  Alert, well-nourished, well-developed elderly Caucasian male in no acute distress Psych:  Mood and affect appropriate for situation CV: Regular rate and rhythm on monitor Respiratory:  Regular, unlabored respirations on room air GI: Abdomen soft and nontender  NEURO:  Mental Status: AA&Ox3, patient is able to give clear and coherent history Speech/Language: speech is without  aphasia.  Mild dysarthria.  Naming, repetition, fluency, and comprehension intact.   Cranial Nerves:  II: PERRL. Visual fields full.  III, IV, VI: EOMI. Eyelids elevate symmetrically.  V: Sensation is intact to light touch and symmetrical to face.  VII: Face is symmetrical resting and smiling VIII: hearing intact to voice. IX, X: Palate elevates  symmetrically. Phonation is normal.  KP:Dynloizm shrug 5/5. XII: tongue is midline without fasciculations. Motor: 5/5 strength to all muscle groups tested.  Tone: is normal and bulk is normal Sensation- Intact to light touch bilaterally. Extinction absent to light touch to DSS.   Coordination: FTN intact bilaterally, HKS: no ataxia in BLE.No drift.  Gait- deferred   Most Recent NIH   1a Level of Conscious.:  1b LOC Questions:  1c LOC Commands:  2 Best Gaze:  3 Visual:  4 Facial Palsy:  5a Motor Arm - left:  5b Motor Arm - Right:  6a Motor Leg - Left:  6b Motor Leg - Right:  7 Limb Ataxia:  8 Sensory:  9 Best Language:  10 Dysarthria: 1 11 Extinct. and Inatten.:  TOTAL: 1     Discharge Diet       Diet   Diet regular Room service appropriate? Yes; Fluid consistency: Thin   liquids  DISCHARGE PLAN Disposition: Home with outpatient PT, OT and ST aspirin  81 mg daily and clopidogrel  75 mg daily for secondary stroke prevention for 3 months then aspirin  81 mg daily alone. Ongoing stroke risk factor control by Primary Care Physician at time of discharge Follow-up PCP Micheal Wolm ORN, MD in 2 weeks. Follow-up in [facility] in 8 weeks, office to schedule an appointment. Able to see NP in clinic.  50 minutes were spent preparing discharge.  [name] Geralds DNP, ACNPC-AG  [facility]  I have personally obtained history,examined this patient, reviewed notes, independently viewed imaging studies, participated in medical decision making and plan of care.ROS completed by me personally and pertinent positives fully documented  I have made any additions or clarifications directly to the above note. Agree with note above.    Eather Popp, MD Medical Director [facility] Pager: [phone] 05/14/2024 1:43 PM

## 2024-05-14 NOTE — Progress Notes (Signed)
 Physical Therapy Treatment Patient Details Name: Jon Spencer MRN: [id number] DOB: 1951/03/02 Today's Date: 05/14/2024   History of Present Illness Pt is a 73 y.o. male who presented 05/12/24 with sudden onset slurred speech. Pt was also noted to have intermittent shuffling gait for ~3 days PTA. CT head revealed no acute abnormality, but CT angiogram revealed near occlusive stenosis of P1 segment of left PCA and moderate stenosis of mid basilar artery. Pt received TNK. MRI showed acute infarct within the L pons, late subacute or chronic small cortical/subcortical infarct within the posterior R frontal lobe, and chronic lacunar infarcts within the L corona radiata and  within/about the bilateral basal ganglia. PMH: ADD, HLD, HTN, tinnitus, trigger finger    PT Comments  Pt in bed upon arrival and agreeable to PT session. Worked on dynamic balance and stair negotiation in today's session. When using no AD, pt required ModA and BUE support to ascend/descend 6 steps. Pt had improved stability when using RW with MinA/CGA required. Multiple repetitions performed with pt feeling more comfortable using RW. Pt will only have one step to enter home. Discussed safety with guarding and use of gait belt with gait belt given at end of session. Pt was slightly unsteady when ambulating with no AD and would occasionally stagger with no challenges to balance. Recommended use of RW to reduce falls risk until pt is able to continue working with PT. Pt scored a 16/24 on the DGI indicating that pt is at an elevated risk of falling. Discussed how to improve safety at home with having wife present when pt is ambulating. Pt verbalized agreement to all recommendations. Continue to recommend OP PT upon return home. Acute PT to follow.     If plan is discharge home, recommend the following: Assistance with cooking/housework;Assist for transportation;Help with stairs or ramp for entrance   Can travel by private vehicle      Yes   Equipment Recommendations  None recommended by PT       Precautions / Restrictions Precautions Precautions: Fall Restrictions Weight Bearing Restrictions Per Provider Order: No     Mobility  Bed Mobility Overal bed mobility: Modified Independent     Transfers Overall transfer level: Needs assistance Equipment used: None Transfers: Sit to/from Stand Sit to Stand: Contact guard assist    General transfer comment: CGA for safety    Ambulation/Gait Ambulation/Gait assistance: Min assist Gait Distance (Feet): 300 Feet (x300, x150) Assistive device: None, Rolling walker (2 wheels) Gait Pattern/deviations: Step-through pattern, Decreased step length - right, Decreased step length - left, Decreased stride length, Shuffle Gait velocity: decr     General Gait Details: Occasionally unsteady with no AD requiring MinA for suport. Increased difficulty with head turns and quick/slow gait. Improved stability with RW   Stairs Stairs: Yes Stairs assistance: Mod assist, Min assist, Contact guard assist Stair Management: One rail Right, Step to pattern, Forwards, Backwards, With walker Number of Stairs: 6 (x6 with BUE support, x5 with RW) General stair comments: Initially pt ascended/descended with forwards pattern, ModA, and BUE support. Trialed use of RW with using backwards approach to ascend and forwards to descend with MinA/CGA.     Modified Rankin (Stroke Patients Only) Modified Rankin (Stroke Patients Only) Pre-Morbid Rankin Score: No symptoms Modified Rankin: Moderately severe disability     Balance Overall balance assessment: Needs assistance Sitting-balance support: Feet supported, No upper extremity supported Sitting balance-Leahy Scale: Good     Standing balance support: No upper extremity supported, During functional activity  Standing balance-Leahy Scale: Fair    High level balance activites: Braiding, Direction changes, Turns, Head turns, Sudden stops High  Level Balance Comments: Slightly unsteady with head turns and quick/slow gait Standardized Balance Assessment Standardized Balance Assessment : Dynamic Gait Index   Dynamic Gait Index Level Surface: Moderate Impairment Change in Gait Speed: Mild Impairment Gait with Horizontal Head Turns: Mild Impairment Gait with Vertical Head Turns: Mild Impairment Gait and Pivot Turn: Normal Step Over Obstacle: Normal Step Around Obstacles: Mild Impairment Steps: Moderate Impairment Total Score: 16      Communication Communication Communication: Impaired Factors Affecting Communication: Reduced clarity of speech;Difficulty expressing self  Cognition Arousal: Alert Behavior During Therapy: WFL for tasks assessed/performed   PT - Cognitive impairments: No apparent impairments    Following commands: Intact      Cueing Cueing Techniques: Verbal cues         Pertinent Vitals/Pain Pain Assessment Pain Assessment: No/denies pain     PT Goals (current goals can now be found in the care plan section) Acute Rehab PT Goals PT Goal Formulation: With patient/family Time For Goal Achievement: 05/27/24 Potential to Achieve Goals: Good Progress towards PT goals: Progressing toward goals    Frequency    Min 2X/week       AM-PAC PT 6 Clicks Mobility   Outcome Measure  Help needed turning from your back to your side while in a flat bed without using bedrails?: None Help needed moving from lying on your back to sitting on the side of a flat bed without using bedrails?: None Help needed moving to and from a bed to a chair (including a wheelchair)?: A Little Help needed standing up from a chair using your arms (e.g., wheelchair or bedside chair)?: A Little Help needed to walk in hospital room?: A Little Help needed climbing 3-5 steps with a railing? : A Lot 6 Click Score: 19    End of Session Equipment Utilized During Treatment: Gait belt Activity Tolerance: Patient tolerated  treatment well Patient left: in bed;with call bell/phone within reach;with bed alarm set Nurse Communication: Mobility status PT Visit Diagnosis: Unsteadiness on feet (R26.81);Other abnormalities of gait and mobility (R26.89);Difficulty in walking, not elsewhere classified (R26.2);Other symptoms and signs involving the nervous system (R29.898)     Time: 9182-9155 PT Time Calculation (min) (ACUTE ONLY): 27 min  Charges:    $Gait Training: 8-22 mins $Therapeutic Activity: 8-22 mins PT General Charges $$ ACUTE PT VISIT: 1 Visit                     Kate ORN, PT, DPT Secure Chat Preferred  Rehab Office [phone]   Kate BRAVO Wendolyn 05/14/2024, 8:50 AM

## 2024-05-14 NOTE — TOC Transition Note (Signed)
 Transition of Care (TOC) - Discharge Note Rayfield Gobble RN, BSN Inpatient Care Management Unit 4E- RN Case Manager See Treatment Team for direct phone # Weekend coverage for 3W  Patient Details  Name: Jon Spencer MRN: [id number] Date of Birth: 1951-06-13  Transition of [facility]) CM/SW Contact:  Gobble Rayfield Hurst, RN Phone Number: 05/14/2024, 12:59 PM   Clinical Narrative:    Pt stable for transition home today, [facility] consult received for outpt PT/OT/ST needs.   CM in to speak with pt at bedside- wife and daughter also present- pt agreeable for CM to speak with family present.   Discussed outpt therapy needs for neuro rehab- PTOT/SLP. Location choice offered- they have selected [facility].  Referral submitted via epic to [facility] rehab- PTOT/SLP- ref# [id number]  Info on AVS for pt/family to follow up.   No DME needs noted. Family to transport home.    Final next level of care: OP Rehab Barriers to Discharge: No Barriers Identified   Patient Goals and CMS Choice Patient states their goals for this hospitalization and ongoing recovery are:: return home with family   Choice offered to / list presented to : Patient, Spouse, Adult Children      Discharge Placement               Home        Discharge Plan and Services Additional resources added to the After Visit Summary for     Discharge Planning Services: CM Consult Post Acute Care Choice: NA (outpt needs)          DME Arranged: N/A DME Agency: NA       HH Arranged: NA HH Agency: NA        Social Drivers of Health (SDOH) Interventions SDOH Screenings   Food Insecurity: No Food Insecurity (05/12/2024)  Housing: Low Risk  (05/13/2024)  Transportation Needs: No Transportation Needs (05/12/2024)  Utilities: Not At Risk (05/12/2024)  Alcohol Screen: Low Risk  (05/19/2022)  Depression (PHQ2-9): Medium Risk (03/18/2023)  Financial Resource Strain: Low Risk  (05/19/2022)   Physical Activity: Unknown (05/19/2022)  Social Connections: Socially Integrated (05/13/2024)  Stress: Stress Concern Present (05/19/2022)  Tobacco Use: Low Risk  (05/12/2024)     Readmission Risk Interventions    05/14/2024   12:59 PM  Readmission Risk Prevention Plan  Post Dischage Appt Complete  Medication Screening Complete  Transportation Screening Complete

## 2024-05-14 NOTE — Progress Notes (Signed)
 DISCHARGE NOTE HOME [facility]EL [facility] to be discharged Home per MD order. Discussed prescriptions and follow up appointments with the patient. Prescriptions given to patient; medication list explained in detail. Patient verbalized understanding.  Skin clean, dry and intact without evidence of skin break down, no evidence of skin tears noted. IV catheter discontinued intact. Site without signs and symptoms of complications. Dressing and pressure applied. Pt denies pain at the site currently. No complaints noted.  Patient free of lines, drains, and wounds.   An After Visit Summary (AVS) was printed and given to the patient. Patient escorted via wheelchair, and discharged home via private auto.  Peyton SHAUNNA Pepper, RN

## 2024-05-14 NOTE — Plan of Care (Signed)

## 2024-05-15 ENCOUNTER — Telehealth: Payer: Self-pay | Admitting: *Deleted

## 2024-05-15 LAB — HEMOGLOBIN A1C
Hgb A1c MFr Bld: 5.8 % — ABNORMAL HIGH (ref 4.8–5.6)
Mean Plasma Glucose: 120 mg/dL

## 2024-05-15 NOTE — Transitions of Care (Post Inpatient/ED Visit) (Signed)
 05/15/2024  Name: Jon Spencer MRN: [id number] DOB: Feb 02, 1951  Today's TOC FU Call Status: Today's TOC FU Call Status:: Successful TOC FU Call Completed TOC FU Call Complete Date: 05/15/24 Patient's Name and Date of Birth confirmed.  Transition Care Management Follow-up Telephone Call Date of Discharge: 05/14/24 Discharge Facility: [facility]) Type of Discharge: Inpatient Admission Primary Inpatient Discharge Diagnosis:: Acute Ischemic Infarct How have you been since you were released from the hospital?: Better (patient is doing better. His speech is still slightly impaired) Any questions or concerns?: Yes Patient Questions/Concerns:: Patient wife wanted to understand better why plavix  and baby aspirin  was given together with the contraindications listed. Patient Questions/Concerns Addressed: Other: (RN explained that they are often given together to reduce the risk of a stroke especially in a high risk patient. RN also pointed out what signs and symptoms of bleeding to look for such as easy bruising, dark stools.)  Items Reviewed: Did you receive and understand the discharge instructions provided?: Yes Medications obtained,verified, and reconciled?: Yes (Medications Reviewed) Any new allergies since your discharge?: No Dietary orders reviewed?: Yes Type of Diet Ordered:: low sodium heart healthy Do you have support at home?: Yes People in Home [RPT]: spouse Name of Support/Comfort Primary Source: Jan wife  Medications Reviewed Today: Medications Reviewed Today     Reviewed by Kennieth Cathlean DEL, RN (Case Manager) on 05/15/24 at 1336  Med List Status: <None>   Medication Order Taking? Sig Documenting Provider Last Dose Status Informant  albuterol  (VENTOLIN  HFA) 108 (90 Base) MCG/ACT inhaler [id number] Yes Inhale 2 puffs into the lungs every 6 (six) hours as needed for wheezing or shortness of breath. Tabori, Katherine E, MD  Active Spouse/Significant Other, Pharmacy  Records  alfuzosin  (UROXATRAL ) 10 MG 24 hr tablet [id number] Yes Take 10 mg by mouth daily. [provider]  Active Spouse/Significant Other, Pharmacy Records  amLODipine  (NORVASC ) 10 MG tablet [id number] Yes TAKE 1 TABLET BY MOUTH EVERY DAY Burchette, Wolm ORN, MD  Active Spouse/Significant Other, Pharmacy Records  aspirin  EC 81 MG tablet [id number] Yes Take 1 tablet (81 mg total) by mouth daily. Swallow whole. [name] LABOR, NP  Active   clopidogrel  (PLAVIX ) 75 MG tablet [id number] Yes Take 1 tablet (75 mg total) by mouth daily. [name] LABOR, NP  Active   gabapentin  (NEURONTIN ) 300 MG capsule [id number] Yes TAKE 1 CAPSULE BY MOUTH TWICE A DAY  Patient taking differently: Take 300 mg by mouth daily.   Micheal Wolm ORN, MD  Active Spouse/Significant Other, Pharmacy Records  losartan  (COZAAR ) 100 MG tablet [id number] Yes TAKE 1 TABLET BY MOUTH EVERY DAY Burchette, Wolm ORN, MD  Active Spouse/Significant Other, Pharmacy Records  mirabegron  ER (MYRBETRIQ ) 50 MG TB24 tablet [id number] Yes TAKE 1 TABLET BY MOUTH EVERY DAY Burchette, Wolm ORN, MD  Active Spouse/Significant Other, Pharmacy Records  Multiple Vitamins-Minerals (MULTIVITAMIN ADULT) CHEW [id number] Yes Chew 1 each by mouth daily. [provider]  Active Spouse/Significant Other, Pharmacy Records  rosuvastatin  (CRESTOR ) 10 MG tablet [id number] Yes Take 1 tablet (10 mg total) by mouth daily. [name] LABOR, NP  Active   sildenafil  (VIAGRA ) 100 MG tablet [id number] Yes Take 1 tablet (100 mg total) by mouth daily as needed for erectile dysfunction. Micheal Wolm ORN, MD  Active Spouse/Significant Other, Pharmacy Records  testosterone  cypionate (DEPOTESTOSTERONE CYPIONATE) 200 MG/ML injection [id number] Yes SMARTSIG:0.3 Milliliter(s) IM Twice a Week [provider]  Active Spouse/Significant Other, Pharmacy Records  Home Care and Equipment/Supplies: Were Home Health Services Ordered?: NA Any new equipment or  medical supplies ordered?: NA  Functional Questionnaire: Do you need assistance with meal preparation?: Yes Do you need assistance with eating?: No Do you have difficulty maintaining continence: No Do you need assistance with getting out of bed/getting out of a chair/moving?: No Do you have difficulty managing or taking your medications?: No  Follow up appointments reviewed: PCP Follow-up appointment confirmed?: Yes Date of PCP follow-up appointment?: 05/16/24 Follow-up Provider: Dr Wol[facility] Follow-up appointment confirmed?: Yes Date of Specialist follow-up appointment?: 05/17/24 Follow-Up Specialty Provider:: Outpatient rehab/ Patient wife will follow up with [facility] for appointment within 8 weeks. She called and left message Do you need transportation to your follow-up appointment?: No Do you understand care options if your condition(s) worsen?: Yes-patient verbalized understanding  RN instructed patient and wife on the symptoms of stroke (FAST) RN discussed signs and symptoms to be aware for bleeding while on Plavix  and ASA RN discussed the importance of why the patient should follow up with his PCP  The patient has been provided with contact information for the care management team and has been advised to call with any health-related questions or concerns. The patient verbalized understanding with current POC. The patient is directed to their insurance card regarding availability of benefits coverage   Cathlean Headland BSN RN [facility] Health Care Management Coordinator [email or web address] Direct Dial: [phone]  Fax: [phone] Website: [email or web address]

## 2024-05-16 ENCOUNTER — Ambulatory Visit: Admitting: Family Medicine

## 2024-05-16 ENCOUNTER — Encounter: Payer: Self-pay | Admitting: Family Medicine

## 2024-05-16 VITALS — BP 138/68 | HR 85 | Temp 98.1°F | Wt 201.3 lb

## 2024-05-16 DIAGNOSIS — I10 Essential (primary) hypertension: Secondary | ICD-10-CM

## 2024-05-16 DIAGNOSIS — I639 Cerebral infarction, unspecified: Secondary | ICD-10-CM

## 2024-05-16 DIAGNOSIS — E785 Hyperlipidemia, unspecified: Secondary | ICD-10-CM | POA: Diagnosis not present

## 2024-05-16 DIAGNOSIS — I350 Nonrheumatic aortic (valve) stenosis: Secondary | ICD-10-CM

## 2024-05-16 DIAGNOSIS — I69328 Other speech and language deficits following cerebral infarction: Secondary | ICD-10-CM | POA: Diagnosis not present

## 2024-05-16 DIAGNOSIS — I69351 Hemiplegia and hemiparesis following cerebral infarction affecting right dominant side: Secondary | ICD-10-CM | POA: Diagnosis not present

## 2024-05-16 NOTE — Therapy (Signed)
 OUTPATIENT PHYSICAL THERAPY NEURO EVALUATION   Patient Name: Jon Spencer MRN: [id number] DOB:May 28, 1951, 73 y.o., male Today's Date: 05/17/2024   PCP:    Micheal Wolm ORN, MD   REFERRING PROVIDER:   [name] LABOR, NP    END OF SESSION:  PT End of Session - 05/17/24 1528     Visit Number 1    Number of Visits 17    Authorization Type [employer]    PT Start Time 1447    PT Stop Time 1526    PT Time Calculation (min) 39 min    Equipment Utilized During Treatment Gait belt    Activity Tolerance Patient tolerated treatment well    Behavior During Therapy WFL for tasks assessed/performed          Past Medical History:  Diagnosis Date   ADD 01/17/2010   Complication of anesthesia    takes awhile to wake up   History of back surgery 07/2018   Tumor removal   History of kidney stones    pt. 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/01/2010   OSTEOARTHRITIS, HAND 04/17/2010   TINNITUS 06/26/2009   TRIGGER FINGER 01/18/2009   Past Surgical History:  Procedure Laterality Date   APPENDECTOMY  1969   COLONOSCOPY     LAMINECTOMY N/A 07/12/2018   Procedure: Lumbar three-four Laminectomy for resection of intradural tumor;  Surgeon: Colon Shove, MD;  [facility];  Service: Neurosurgery;  Laterality: N/A;   VASECTOMY  1992   Patient Active Problem List   Diagnosis Date Noted   Dysphagia 05/14/2024   CKD (chronic kidney disease) 05/14/2024   Obesity 05/14/2024   Stroke determined by clinical assessment (HCC) 05/12/2024   Acute ischemic stroke (HCC) 05/12/2024   Aortic valve stenosis 05/05/2023   SOB (shortness of breath) 05/05/2023   Ganglion cyst of flexor tendon sheath of finger of left hand 04/29/2021   De Quervain's tenosynovitis, right 04/25/2020   Schwannoma 07/12/2018   Hypogonadism male 04/10/2011   IMPOTENCE OF ORGANIC ORIGIN 10/01/2010   Osteoarthrosis, hand 04/17/2010    Attention deficit disorder 01/17/2010   Tinnitus 06/26/2009   Hyperlipidemia 01/18/2009   Essential hypertension 01/18/2009   Trigger finger, acquired 01/18/2009    ONSET DATE: 05/12/24  REFERRING DIAG: I63.9 (ICD-10-CM) - Acute CVA (cerebrovascular accident)  THERAPY DIAG:  Unsteadiness on feet  Muscle weakness (generalized)  Other lack of coordination  Other abnormalities of gait and mobility  Rationale for Evaluation and Treatment: Rehabilitation  SUBJECTIVE:  SUBJECTIVE STATEMENT: "If I get my speech worked out [employer] be fine." Patient reports that he is wobbly and unstable. Reports that he uses a RW, especially when taking the first couple steps after taking a nap. Reports that he is trying not to use the walker as much as possible. 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 This is something he would like to return to. Reports feeling a little out of breath with activity. Denies pain or dizziness. Reports tingling sensation down his back and into the R LE when he stands from a nap.    Pt accompanied by: self  PERTINENT HISTORY: ADD, HLD, HTN, L3/4 laminectomy 2019  PAIN:  Are you having pain? No  PRECAUTIONS: Fall  RED FLAGS: None   WEIGHT BEARING RESTRICTIONS: No  FALLS: Has patient fallen in last 6 months? 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  DIAGNOSTIC FINDINGS: 05/12/24 brain MRI: 15 mm acute infarct within the left aspect of the pons. 2. Small cortical/subcortical infarct within the posterior right frontal lobe, late subacute or chronic. 3. Chronic lacunar infarcts within the left corona radiata and within/about the bilateral basal ganglia.  COGNITION: Overall cognitive status: Within functional limits for tasks assessed   SENSATION: Reports episodic tingling sensation down his back and into the R LE when he stands from a nap.  COORDINATION: Alternating pronation/supination: slightly slowed on L UE Alternating toe tap: WNL B Finger to nose: WNL B   MUSCLE TONE: tight musculature in B LEs but does not seem neurologic in nature  POSTURE: rounded shoulders  LOWER EXTREMITY ROM:     Active  Right Eval Left Eval  Hip flexion    Hip extension    Hip abduction    Hip adduction    Hip internal rotation    Hip external rotation    Knee flexion    Knee extension    Ankle dorsiflexion -2 10  Ankle plantarflexion    Ankle inversion    Ankle eversion     (Blank rows = not tested)  LOWER EXTREMITY MMT:    MMT (in sitting) Right Eval Left Eval  Hip flexion 4 4+  Hip extension    Hip abduction 4- 4+  Hip adduction 4 4  Hip internal rotation    Hip external rotation    Knee flexion 4- 4  Knee extension 4+ 4+  Ankle dorsiflexion 2+ (d/t reduced ROM) 4+  Ankle plantarflexion 4+ 4+  Ankle inversion    Ankle eversion    (Blank rows = not tested)  GAIT: Findings: Assistive device utilized:hovering walker above the floor, Level of assistance: SBA, and Comments: antalgia and reduced stance time on R LE with lateral lean and mild instability   FUNCTIONAL TESTS:  5 times sit to stand: 19.05 sec with B armrests; unable to stand without UE support  10 meter walk test: 15.6 sec with pt hovering RW above the floor (2.1 ft/sec)   [facility]  PT Assessment - 05/17/24 0001  Standardized Balance Assessment   Standardized Balance Assessment Berg Balance Test      Berg Balance Test   Sit to Stand Able to stand  independently using hands    Standing Unsupported Able to stand safely 2 minutes    Sitting with Back Unsupported but Feet Supported on Floor or Stool Able to sit safely and securely 2 minutes    Stand to Sit Sits safely with minimal use of hands    Transfers Able to transfer safely, definite need of hands    Standing Unsupported with Eyes Closed Able to stand 10 seconds safely    Standing Unsupported with Feet Together Able to place feet together independently and stand for 1 minute with supervision    From Standing, Reach Forward with Outstretched Arm Can reach forward >12 cm safely (5)    From Standing Position, Pick up Object from Floor Able to pick up shoe, needs supervision    From Standing Position, Turn to Look Behind Over each Shoulder Needs supervision when turning    Turn 360 Degrees Able to turn 360 degrees safely but slowly    Standing Unsupported, Alternately Place Feet on Step/Stool Able to complete 4 steps without aid or supervision    Standing Unsupported, One Foot in Front Able to plae foot ahead of the other independently and hold 30 seconds    Standing on One Leg Tries to lift leg/unable to hold 3 seconds but remains standing independently    Total Score 40                                                                                                                                      TREATMENT DATE: 05/17/24    PATIENT EDUCATION: Education details: prognosis, POC, edu on exam findings; ed on falls risk according to balance testing and recommended proper use of RW Person educated: Patient Education method: Explanation, Tactile cues, and Verbal cues Education comprehension: verbalized understanding and returned demonstration  HOME EXERCISE PROGRAM: Not yet initiated   GOALS: Goals  reviewed with patient? Yes  SHORT TERM GOALS: Target date: 06/07/2024  Patient to be independent with initial HEP. Baseline: HEP initiated Goal status: INITIAL    LONG TERM GOALS: Target date: 07/12/2024  Patient to be independent with advanced HEP. Baseline: Not yet initiated  Goal status: INITIAL  Patient to demonstrate R LE strength >/=4/5.  Baseline: See above Goal status: INITIAL  Patient to demonstrate R ankle DF AROM to at least 5 degrees to improve efficiency of gait.  Baseline: -2 deg Goal status: INITIAL  to be assessed and goal made. Baseline: NT Goal status: INITIAL  Patient to demonstrate gait speed of 2.7 ft/sec with LRAD in order to improve access to community.   Baseline: 2.1 ft/sec Goal status: INITIAL  Patient to demonstrate 5xSTS test in <15 sec in order to decrease risk of falls.  Baseline: 19.05 sec Goal status:  INITIAL  Patient to score at least 46/56 on Berg in order to decrease risk of falls.  Baseline: 40 Goal status: INITIAL    ASSESSMENT:  CLINICAL IMPRESSION:  Patient is a 73 y/o M presenting to [facility] with c/o imbalance s/p hospital admission 05/12/24-05/14/24 for L pons and R frontal lobe CVA, imaging also demonstrating chronic infarcts in L corona radiata and B BG.Patient today presenting with decreased L UE coordination, decreased LE flexibility, limited R ankle DF AROM, decreased R LE strength, gait deviations, increased time required for gait and transfers, and imbalance. Patient's scores on 5xSTS and Lars indicate an increased risk of falls. Prior to current episode, patient was independent and working full-time. Would benefit from skilled PT services 2 x/week for 8 weeks to address aforementioned impairments in order to optimize level of function.    OBJECTIVE IMPAIRMENTS: Abnormal gait, decreased activity tolerance, decreased balance, decreased coordination, decreased endurance, difficulty walking, decreased ROM, decreased strength,  decreased safety awareness, impaired perceived functional ability, impaired flexibility, and postural dysfunction.   ACTIVITY LIMITATIONS: carrying, lifting, bending, standing, squatting, stairs, transfers, bathing, toileting, dressing, reach over head, hygiene/grooming, and locomotion level  PARTICIPATION LIMITATIONS: meal prep, cleaning, laundry, driving, shopping, community activity, occupation, yard work, and church  PERSONAL FACTORS: Age, Past/current experiences, Profession, Time since onset of injury/illness/exacerbation, and 3+ comorbidities: ADD, HLD, HTN, L3/4 laminectomy 2019 are also affecting patient's functional outcome.   REHAB POTENTIAL: Good  CLINICAL DECISION MAKING: Evolving/moderate complexity  EVALUATION COMPLEXITY: Moderate  PLAN:  PT FREQUENCY: 2x/week  PT DURATION: 8 weeks  PLANNED INTERVENTIONS: [id number]- PT Re-evaluation, 97750- Physical Performance Testing, 97110-Therapeutic exercises, 97530- Therapeutic activity, [id number]- Neuromuscular re-education, 97535- Self Care, [id number]- Manual therapy, [phone]- Gait training, [phone]- Orthotic Initial, [phone]- Orthotic/Prosthetic subsequent, [phone]- Canalith repositioning, [phone]- Aquatic Therapy, Patient/Family education, Balance training, Stair training, Taping, Vestibular training, DME instructions, Cryotherapy, and Moist heat  PLAN FOR NEXT SESSION: ; initiate HEP for R LE strengthening, STS, narrow BOS and dynamic balance    Louana Terrilyn Christians, PT, DPT 05/17/24 3:37 PM  [facility] [address] [place], [place] [address] Phone # [phone] Fax # [phone]

## 2024-05-16 NOTE — Progress Notes (Signed)
 Established Patient Office Visit  Subjective   Patient ID: Jon Spencer, male    DOB: Aug 14, 1951  Age: 73 y.o. MRN: [id number]  Chief Complaint  Patient presents with   Hospitalization Follow-up    HPI   Jon Spencer is seen for hospital follow-up.  Recent CVA.  He has history of hypertension and hyperlipidemia.  He had presented to the emergency department on 05-12-2024 after onset around lunchtime Friday of some speech changes.  His wife actually noted that he seemed to be shuffling with his gait some around Wednesday of that week and perhaps some difficulties or changes with ambulation.  He initially declined going to the hospital but had some progressive speech difficulties and agreed to go.  Initially called our office and was advised to go immediately to the hospital.  Was determined to have acute infarct left pons and small cortical/subcortical infarct posterior right frontal lobe.  He did receive TNK.  Code stroke was called.  Initial CT head no acute abnormality.  Evidence for small vessel disease and some atrophy.  MRI showed 15 mm acute infarct within the left aspect of the pons.  There is also finding of small cortical/subcortical infarct within the posterior right frontal lobe, late subacute or chronic.  Chronic lacunar infarcts within the left corona radiata and within the bilateral basal ganglia.  CT angiogram head and neck showed near occlusive stenosis of P1 segment of the left posterior cerebral artery.  Moderate stenosis of the mid basilar artery.  Moderate to severe stenosis of the communicating segments of the internal carotid arteries bilaterally.  Calcific plaque within the carotid bulbs bilaterally but no significant ICA stenosis.  Echocardiogram EF 55 to 60%.  Grade 1 diastolic dysfunction.  LDL 140.  A1c 5.8%.  Patient was started on rosuvastatin  10 mg daily and tolerating well.  Also started on dual antiplatelet therapy with Plavix  75 mg daily and aspirin  81 mg daily.  Was  not taking any antiplatelet agents prior to admission.  Was maintained on his usual blood pressure medications including losartan  100 mg daily and amlodipine  10 mg daily.  Plan was to use dual treatment with Plavix  and aspirin  for 3 months followed by aspirin .  Patient doing relatively well at this time.  He is anxious to get back to work.  He has been set up for outpatient OT, PT, and speech therapy.  He has pending follow-up with neurologist in about 8 weeks.  He has had some subtle weakness right upper and lower extremity and still has some mild speech impediment.  Past Medical History:  Diagnosis Date   ADD 01/17/2010   Complication of anesthesia    takes awhile to wake up   History of back surgery 07/2018   Tumor removal   History of kidney stones    pt. 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/01/2010   OSTEOARTHRITIS, HAND 04/17/2010   TINNITUS 06/26/2009   TRIGGER FINGER 01/18/2009   Past Surgical History:  Procedure Laterality Date   APPENDECTOMY  1969   COLONOSCOPY     LAMINECTOMY N/A 07/12/2018   Procedure: Lumbar three-four Laminectomy for resection of intradural tumor;  Surgeon: Colon Shove, MD;  Location: [facility] OR;  Service: Neurosurgery;  Laterality: N/A;   VASECTOMY  1992    reports that he has never smoked. He has never used smokeless tobacco. He reports current alcohol use of about 1.0 - 4.0 standard drink  of alcohol per week. He reports that he does not use drugs. family history includes Arthritis in an other family member; Bladder Cancer in his father; Hyperlipidemia in an other family member; Hypertension in an other family member; Stroke in his mother and sister; Sudden death in an other family member. Allergies  Allergen Reactions   Tolectin [Tolmetin Sodium] Other (See Comments)    Fever 104, breathing problems   Meloxicam Other (See Comments)    REACTION: Flushed, high temp   Univasc [Moexipril  Hydrochloride] Cough    coughing    Review of Systems  Constitutional:  Positive for malaise/fatigue. Negative for chills and fever.  Eyes:  Negative for blurred vision.  Respiratory:  Negative for shortness of breath.   Cardiovascular:  Negative for chest pain.  Gastrointestinal:  Negative for abdominal pain.  Genitourinary:  Negative for dysuria.  Neurological:  Negative for dizziness, weakness and headaches.      Objective:     BP 138/68 (BP Location: Left Arm, Cuff Size: Normal)   Pulse 85   Temp 98.1 F (36.7 C) (Oral)   Wt 201 lb 4.8 oz (91.3 kg)   SpO2 96%   BMI 30.61 kg/m  BP Readings from Last 3 Encounters:  05/16/24 138/68  05/14/24 (!) 144/81  12/10/23 (!) 142/70   Wt Readings from Last 3 Encounters:  05/16/24 201 lb 4.8 oz (91.3 kg)  05/12/24 (S) 206 lb 12.7 oz (93.8 kg)  12/10/23 209 lb 6.4 oz (95 kg)      Physical Exam Vitals reviewed.  Constitutional:      General: He is not in acute distress.    Appearance: He is not ill-appearing.  HENT:     Head: Normocephalic and atraumatic.  Cardiovascular:     Rate and Rhythm: Normal rate and regular rhythm.     Heart sounds: Murmur heard.     Comments: Has 2/6 to 3/6 systolic ejection murmur heard best right upper sternal border. Musculoskeletal:     Right lower leg: No edema.     Left lower leg: No edema.  Neurological:     Mental Status: He is alert.     Cranial Nerves: No cranial nerve deficit.     Motor: Weakness present.     Coordination: Coordination normal.     Comments: Still has some mild pronator drift on the right side.  He has mild weakness with right hip flexion against resistance compared to the left.  Psychiatric:        Mood and Affect: Mood normal.      No results found for any visits on 05/16/24.  Last CBC Lab Results  Component Value Date   WBC 9.6 05/12/2024   HGB 14.5 05/12/2024   HCT 43.7 05/12/2024   MCV 90.1 05/12/2024   MCH 29.9 05/12/2024   RDW 13.9 05/12/2024    PLT 264 05/12/2024   Last metabolic panel Lab Results  Component Value Date   GLUCOSE 148 (H) 05/12/2024   NA 139 05/12/2024   K 4.2 05/12/2024   CL 105 05/12/2024   CO2 21 (L) 05/12/2024   BUN 23 05/12/2024   CREATININE 1.52 (H) 05/12/2024   GFRNONAA 48 (L) 05/12/2024   CALCIUM  9.2 05/12/2024   PROT 6.5 05/12/2024   ALBUMIN 4.0 05/12/2024   BILITOT 0.4 05/12/2024   ALKPHOS 71 05/12/2024   AST 28 05/12/2024   ALT 17 05/12/2024   ANIONGAP 13 05/12/2024   Last lipids Lab Results  Component Value Date  CHOL 206 (H) 05/13/2024   HDL 43 05/13/2024   LDLCALC 140 (H) 05/13/2024   LDLDIRECT 162.6 09/26/2013   TRIG 114 05/13/2024   CHOLHDL 4.8 05/13/2024   Last hemoglobin A1c Lab Results  Component Value Date   HGBA1C 5.8 (H) 05/13/2024   Last thyroid  functions Lab Results  Component Value Date   TSH 1.29 05/18/2017      The ASCVD Risk score (Arnett DK, et al., 2019) failed to calculate for the following reasons:   Risk score cannot be calculated because patient has a medical history suggesting prior/existing ASCVD    Assessment & Plan:   #1 recent acute ischemic infarct involving left pons, small cortical-subcortical infarct posterior right frontal lobe status post TNK.  Patient still has some speech difficulties.  Very mild weakness right upper extremity and right lower extremity but ambulating without difficulty with a walker.  -outpatient OT,PT, speech therapy has been set up. -DAPT with ASA 81 mg plus Plavix  75 mg for 3 months and then aspirin  alone. - Statin therapy with rosuvastatin  with goal LDL less than 70 - A1c 5.8% - Try to keep blood pressure around 130/80 or less.  Home blood pressure since discharge have been in this range. - Keep close follow-up with neurology - Recommend out of work minimum of 3 weeks and then slowly progress back if stable and making good recovery at that time  #2 hypertension.  As above.  Currently on amlodipine  and losartan .   Continue current regimen and close home monitoring.  Be in touch if consistently greater than 130/80.  Continue low-sodium diet  #3 hyperlipidemia.  Goal LDL less than 70.  Recently started on rosuvastatin  10 mg daily.  Future lab order for lipid and hepatic panel placed  #4 aortic stenosis murmur.  Echocardiogram during hospitalization with gradient similar to TTE done on 03-23-2023.  Moderate aortic valve stenosis.  Watch for any progressive dizziness this, dyspnea with exertion, or syncope.  Continue follow-up with cardiology  #5 history of hypogonadism on testosterone  therapy.  We discussed the fact that weighing out potential risk versus benefit for testosterone  replacement following stroke is very complex and somewhat controversy will.  He has not had any recent polycythemia.  While some increased risk of venous thromboembolism and potentially atrial fibrillation also some evidence apparently for improved functional recovery following stroke in patients on testosterone  replacement.  We have recommended that he discuss with his neurologist to get their input.   Return in about 6 weeks (around 06/27/2024).    Wolm Scarlet, MD

## 2024-05-17 ENCOUNTER — Encounter: Payer: Self-pay | Admitting: Physical Therapy

## 2024-05-17 ENCOUNTER — Ambulatory Visit: Attending: Nurse Practitioner | Admitting: Occupational Therapy

## 2024-05-17 ENCOUNTER — Ambulatory Visit: Admitting: Physical Therapy

## 2024-05-17 ENCOUNTER — Other Ambulatory Visit: Payer: Self-pay

## 2024-05-17 DIAGNOSIS — R2681 Unsteadiness on feet: Secondary | ICD-10-CM | POA: Insufficient documentation

## 2024-05-17 DIAGNOSIS — M6281 Muscle weakness (generalized): Secondary | ICD-10-CM | POA: Insufficient documentation

## 2024-05-17 DIAGNOSIS — I69353 Hemiplegia and hemiparesis following cerebral infarction affecting right non-dominant side: Secondary | ICD-10-CM | POA: Insufficient documentation

## 2024-05-17 DIAGNOSIS — R2689 Other abnormalities of gait and mobility: Secondary | ICD-10-CM | POA: Diagnosis not present

## 2024-05-17 DIAGNOSIS — R1312 Dysphagia, oropharyngeal phase: Secondary | ICD-10-CM | POA: Insufficient documentation

## 2024-05-17 DIAGNOSIS — R278 Other lack of coordination: Secondary | ICD-10-CM

## 2024-05-17 DIAGNOSIS — R471 Dysarthria and anarthria: Secondary | ICD-10-CM | POA: Insufficient documentation

## 2024-05-17 NOTE — Therapy (Signed)
 OUTPATIENT OCCUPATIONAL THERAPY NEURO EVALUATION  Patient Name: Jon Spencer MRN: [id number] DOB:[birth year], 73 y.o., male Today's Date: 05/17/2024  PCP: Micheal Wolm ORN, MD REFERRING PROVIDER: Rosemarie Eather GORMAN, MD  END OF SESSION:  OT End of Session - 05/17/24 1447     Visit Number 1    Number of Visits 7    Date for OT Re-Evaluation 06/30/24    Authorization Type [employer] [id number]    OT Start Time 1405    OT Stop Time 1447    OT Time Calculation (min) 42 min          Past Medical History:  Diagnosis Date   ADD 01/17/2010   Complication of anesthesia    takes awhile to wake up   History of back surgery 07/2018   Tumor removal   History of kidney stones    pt. 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/01/2010   OSTEOARTHRITIS, HAND 04/17/2010   TINNITUS 06/26/2009   TRIGGER FINGER 01/18/2009   Past Surgical History:  Procedure Laterality Date   APPENDECTOMY  1969   COLONOSCOPY     LAMINECTOMY N/A 07/12/2018   Procedure: Lumbar three-four Laminectomy for resection of intradural tumor;  Surgeon: Colon Shove, MD;  Location: [facility] OR;  Service: Neurosurgery;  Laterality: N/A;   VASECTOMY  1992   Patient Active Problem List   Diagnosis Date Noted   Dysphagia 05/14/2024   CKD (chronic kidney disease) 05/14/2024   Obesity 05/14/2024   Stroke determined by clinical assessment (HCC) 05/12/2024   Acute ischemic stroke (HCC) 05/12/2024   Aortic valve stenosis 05/05/2023   SOB (shortness of breath) 05/05/2023   Ganglion cyst of flexor tendon sheath of finger of left hand 04/29/2021   De Quervain's tenosynovitis, right 04/25/2020   Schwannoma 07/12/2018   Hypogonadism male 04/10/2011   IMPOTENCE OF ORGANIC ORIGIN 10/01/2010   Osteoarthrosis, hand 04/17/2010   Attention deficit disorder 01/17/2010   Tinnitus 06/26/2009   Hyperlipidemia 01/18/2009   Essential hypertension 01/18/2009    Trigger finger, acquired 01/18/2009    ONSET DATE: 05/12/24  REFERRING DIAG: Rosemarie Eather GORMAN, MD  THERAPY DIAG:  Hemiplegia and hemiparesis following cerebral infarction affecting right non-dominant side (HCC)  Unsteadiness on feet  Muscle weakness (generalized)  Other lack of coordination  Rationale for Evaluation and Treatment: Rehabilitation  SUBJECTIVE:   SUBJECTIVE STATEMENT: 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 Pt report that he is a little wobbly as he walks, notices that the balance is not what it once was.  Pt and spouse note that his Right side is a little weaker than it was. Pt accompanied by: self (wife remained in waiting room)  PERTINENT HISTORY: ADD, HLD, HTN, L3/4 laminectomy 2019   73 y.o. male who presented 05/12/24 with sudden onset slurred speech. Pt was also noted to have intermittent shuffling gait for ~3 days PTA. CT head revealed no acute abnormality, but CT angiogram revealed near occlusive stenosis of P1 segment of left PCA and moderate stenosis of mid basilar artery. Pt received TNK. MRI showed acute infarct within the L pons, late subacute or chronic small cortical/subcortical infarct within the posterior R frontal lobe, and chronic lacunar infarcts within the L corona radiata and within/about the bilateral basal ganglia.  PRECAUTIONS: Fall  WEIGHT BEARING RESTRICTIONS: No  PAIN:  Are you having pain? No  FALLS: Has patient fallen in  last 6 months? 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  HAND DOMINANCE: Left  ADLs: Overall ADLs: it just takes longer Transfers/ambulation related to ADLs: using RW in the home, OF NOTE pt was carrying RW during ambulation  Eating: Mod I Grooming: Mod I UB Dressing: Mod I LB Dressing: Mod I Toileting: Mod I Bathing: Mod I - Supervision Tub Shower transfers: wife providing supervision for transfers, use lawn chair in shower for safety when bathing Equipment: bed side commode  IADLs: Light housekeeping: spouse performs all these tasks Meal Prep: was cooking prior to CVA but has not returned to that yet Community mobility: MD encouraged pt to use his judgement prior to return to driving Medication management: Mod I Financial management: Spouse pays the bills  MOBILITY STATUS: recommended to use RW for mobility, OF NOTE pt carrying RW during ambulation despite cues to use appropriately  POSTURE COMMENTS:  rounded shoulders  ACTIVITY TOLERANCE: Activity tolerance: diminished, reports getting fatigued more quickly  FUNCTIONAL OUTCOME MEASURES: Quick Dash: 18.2   UPPER EXTREMITY ROM:    Active ROM Right eval Left eval  Shoulder flexion 140 160  Shoulder abduction    Shoulder adduction    Shoulder extension    Shoulder internal rotation [facility] WFL  Shoulder external rotation [facility] WFL  Elbow flexion WFL WFL  Elbow extension [facility] WFL  Wrist flexion    Wrist extension    Wrist ulnar deviation    Wrist radial deviation    Wrist pronation    Wrist supination    (Blank rows = not tested)  UPPER EXTREMITY MMT:     MMT Right eval Left eval  Shoulder flexion 4- 4+  Shoulder abduction    Shoulder adduction    Shoulder extension    Shoulder internal rotation    Shoulder external rotation    Middle  trapezius    Lower trapezius    Elbow flexion    Elbow extension    Wrist flexion    Wrist extension    Wrist ulnar deviation    Wrist radial deviation    Wrist pronation    Wrist supination    (Blank rows = not tested)  HAND FUNCTION: Grip strength: Right: 40, 26, 26 (30.6# average) lbs; Left: 50, 46, 45 (47# average) lbs  COORDINATION: Finger Nose Finger test: [facility] 9 Hole Peg test: Right: 54.41 sec; Left: 35.94 sec Box and Blocks:  Right 36 blocks, Left 44 blocks Mild decreased gross motor control with supination/pronation  SENSATION: WFL  COGNITION: Overall cognitive status: reports not a great memory but no changes  VISION: Subjective report: no changes Baseline vision: Wears glasses for reading only Visual history: had shingles in his L eye and now has 2 scabs on eye ball, but no changes in vision  TREATMENT DATE:   05/17/24 Educated on fine motor coordination activities to complete at home to continue to address impaired coordination.  OT providing with handout of activities both functional in nature and structured tasks to challenge coordination.          PATIENT EDUCATION: Education details: Educated on role and purpose of OT as well as potential interventions and goals for therapy based on initial evaluation findings. Person educated: Patient Education method: Explanation, Verbal cues, and Handouts Education comprehension: verbalized understanding and needs further education  HOME EXERCISE PROGRAM: 05/17/24 - Fine motor coordination tasks (see pt instructions)   GOALS: Goals reviewed with patient? Yes  SHORT TERM GOALS: Target date: 06/09/24  Pt will be independent in fine motor coordination HEP. Baseline:new to OPOT Goal status: INITIAL  2.  Pt will verbalize understanding of energy conservation strategies to facilitate safe  return to work. Baseline: reports decreased endurance Goal status: INITIAL  3.  Pt will verbalize understanding of RTW recommendations. Baseline: expressing desire to RTW Goal status: INITIAL    LONG TERM GOALS: Target date: 06/30/24  Pt will demonstrate improved fine motor coordination as needed for ADLs as evidenced by increased score on 9 hole peg test by 8 seconds with RUE. Baseline: 9 Hole Peg test: Right: 54.41 sec; Left: 35.94 sec Goal status: INITIAL  2.  Pt will demonstrate improved grip strength to >35# average with RUE to allow for increased ease with opening containers Baseline: Grip strength: Right: 40, 26, 26 (30.6# average) lbs; Left: 50, 46, 45 (47# average) lbs Goal status: INITIAL  3.  Pt will verbalize understanding of return to driving recommendations. Baseline: not cleared to drive Goal status: INITIAL  4.  T will demonstrate improved standing tolerance by engaging in 15 mins standing functional tasks to demonstrate improved balance and activity tolerance to allow safe RTW. Baseline: decreased endurance Goal status: INITIAL  5.  Pt will demonstrate improved functional use of RUE as evidenced by improved score on Quick Dash to <14% impairment. Baseline: 18.2% Goal status: INITIAL    ASSESSMENT:  CLINICAL IMPRESSION: Patient is a 73 y.o. male who was seen today for occupational therapy evaluation for impairments s/p CVA. Pt demonstrating mild impairments in nondominant RUE coordination and strength as well as diminished endurance and balance impacting IADLs and work tasks.  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  Pt will benefit from skilled occupational therapy services to address strength and coordination, balance, GM/FM control, safety awareness, introduction of compensatory strategies/AE prn,and implementation of an HEP to improve participation and safety  during ADLs/IADLs, and return to work.    PERFORMANCE DEFICITS: in functional skills including ADLs, IADLs, coordination, strength, Fine motor control, Gross motor control, balance, body mechanics, endurance, decreased knowledge of precautions, decreased knowledge of use of DME, and UE functional use and psychosocial skills including routines and behaviors.   IMPAIRMENTS: are limiting patient from ADLs, IADLs, and work.   CO-MORBIDITIES: may have co-morbidities  that affects occupational performance. Patient will benefit from skilled OT to address above impairments and improve overall function.  MODIFICATION OR ASSISTANCE TO COMPLETE EVALUATION: Min-Moderate modification of tasks or assist with assess necessary to complete an evaluation.  OT OCCUPATIONAL PROFILE AND HISTORY: Detailed assessment: Review of records and additional review of physical, cognitive, psychosocial history related to current functional performance.  CLINICAL DECISION MAKING: LOW - limited treatment options, no task modification necessary  REHAB POTENTIAL: Good  EVALUATION COMPLEXITY: Low    PLAN:  OT FREQUENCY: 1-2x/week  OT DURATION: 6 weeks  PLANNED INTERVENTIONS: 97168 OT Re-evaluation, 97535 self care/ADL training, 02889 therapeutic exercise, 97530 therapeutic activity, 97112 neuromuscular re-education, 97032 electrical stimulation (manual), balance training, functional mobility training, energy conservation, coping strategies training, and patient/family education  RECOMMENDED OTHER SERVICES: SLP  CONSULTED AND AGREED WITH PLAN OF CARE: Patient  PLAN FOR NEXT SESSION: Coordination HEP, grip strengthening with hand gripper and putty, edu on energy conservation, edu on RTW and return to driving   Sedan, LAURAINE, OTR/L 05/17/2024, 4:25 PM  [facility] at [facility] [address], [address] [place], [place] [address] Phone # [phone] Fax # [phone]

## 2024-05-18 ENCOUNTER — Encounter: Payer: Self-pay | Admitting: Family Medicine

## 2024-05-23 ENCOUNTER — Ambulatory Visit

## 2024-05-23 DIAGNOSIS — R2681 Unsteadiness on feet: Secondary | ICD-10-CM

## 2024-05-23 DIAGNOSIS — I69353 Hemiplegia and hemiparesis following cerebral infarction affecting right non-dominant side: Secondary | ICD-10-CM | POA: Diagnosis not present

## 2024-05-23 DIAGNOSIS — R2689 Other abnormalities of gait and mobility: Secondary | ICD-10-CM

## 2024-05-23 DIAGNOSIS — M6281 Muscle weakness (generalized): Secondary | ICD-10-CM

## 2024-05-23 NOTE — Therapy (Signed)
 OUTPATIENT PHYSICAL THERAPY NEURO TREATMENT   Patient Name: Jon Spencer MRN: [id number] DOB:Jan 02, 1951, 73 y.o., male Today's Date: 05/23/2024   PCP:    Micheal Wolm ORN, MD   REFERRING PROVIDER:   [name] LABOR, NP    END OF SESSION:  PT End of Session - 05/23/24 1349     Visit Number 2    Number of Visits 17    Date for PT Re-Evaluation 07/12/24    Authorization Type [employer]    PT Start Time 1400    PT Stop Time 1445    PT Time Calculation (min) 45 min    Equipment Utilized During Treatment Gait belt    Activity Tolerance Patient tolerated treatment well    Behavior During Therapy WFL for tasks assessed/performed          Past Medical History:  Diagnosis Date   ADD 01/17/2010   Complication of anesthesia    takes awhile to wake up   History of back surgery 07/2018   Tumor removal   History of kidney stones    pt. 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/01/2010   OSTEOARTHRITIS, HAND 04/17/2010   TINNITUS 06/26/2009   TRIGGER FINGER 01/18/2009   Past Surgical History:  Procedure Laterality Date   APPENDECTOMY  1969   COLONOSCOPY     LAMINECTOMY N/A 07/12/2018   Procedure: Lumbar three-four Laminectomy for resection of intradural tumor;  Surgeon: Colon Shove, MD;  Location: [facility] OR;  Service: Neurosurgery;  Laterality: N/A;   VASECTOMY  1992   Patient Active Problem List   Diagnosis Date Noted   Dysphagia 05/14/2024   CKD (chronic kidney disease) 05/14/2024   Obesity 05/14/2024   Stroke determined by clinical assessment (HCC) 05/12/2024   Acute ischemic stroke (HCC) 05/12/2024   Aortic valve stenosis 05/05/2023   SOB (shortness of breath) 05/05/2023   Ganglion cyst of flexor tendon sheath of finger of left hand 04/29/2021   De Quervain's tenosynovitis, right 04/25/2020   Schwannoma 07/12/2018   Hypogonadism male 04/10/2011   IMPOTENCE OF ORGANIC ORIGIN 10/01/2010    Osteoarthrosis, hand 04/17/2010   Attention deficit disorder 01/17/2010   Tinnitus 06/26/2009   Hyperlipidemia 01/18/2009   Essential hypertension 01/18/2009   Trigger finger, acquired 01/18/2009    ONSET DATE: 05/12/24  REFERRING DIAG: I63.9 (ICD-10-CM) - Acute CVA (cerebrovascular accident)  THERAPY DIAG:  Unsteadiness on feet  Muscle weakness (generalized)  Other abnormalities of gait and mobility  Rationale for Evaluation and Treatment: Rehabilitation  SUBJECTIVE:  SUBJECTIVE STATEMENT: Doing ok, nothing new since last here. Notes ongoing issue when first arising experiences tingling from back to legs and dissipates after 3 sec. When sitting and initiates to stand feels tightness around lateral thighs. Currently walking 150 ft 5x/day at home.     Pt accompanied by: self  PERTINENT HISTORY: ADD, HLD, HTN, L3/4 laminectomy 2019  PAIN:  Are you having pain? No  PRECAUTIONS: Fall  RED FLAGS: None   WEIGHT BEARING RESTRICTIONS: No  FALLS: Has patient fallen in last 6 months? 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  PATIENT GOALS: return to work   OBJECTIVE:   TODAY'S TREATMENT: 05/23/24 Activity Comments  Vitals pre: 150/72 mmHg, 68 bpm Distance: 900 ft, no AD Vitals post: 158/71 mmHg, 77 bpm  Gait training Use of trekking poles for increased speed  Corner balance   Sit to stand 2x10 reps No UE support needed            [facility] PT Assessment - 05/23/24 0001       6 Minute Walk- Baseline   6 Minute Walk- Baseline yes    BP (mmHg) 150/72    HR (bpm) 68    02 Sat (%RA) 98 %    Perceived Rate of Exertion (Borg) 6-      6 Minute walk- Post Test   6 Minute Walk Post Test yes    BP (mmHg) 158/71    HR (bpm) 77    02 Sat (%RA) 97 %    Perceived Rate of Exertion (Borg) 13- Somewhat hard      6 minute walk test results    Aerobic Endurance Distance Walked 900          Access Code: [facility] URL: [email or web address] Date: 05/23/2024 Prepared by: Kelly Justin Buechner  Exercises - Corner Balance Feet Together With Eyes Open  - 1 x daily - 7 x weekly - 3 sets - 30 sec hold - Corner Balance Feet Together With Eyes Closed  - 1 x daily - 7 x weekly - 3 sets - 30 sec hold - Corner Balance Feet Together: Eyes Open With Head Turns  - 1 x daily - 7 x weekly - 3 sets - 30 sec hold - Corner Balance Feet Together: Eyes Closed With Head Turns  - 1 x daily - 7 x weekly - 3 sets - 30 sec hold - Sit to Stand Without Arm Support  - 1 x daily - 7 x weekly - 3 sets - 10 reps  Note: Objective measures were completed at Evaluation unless otherwise noted.  DIAGNOSTIC FINDINGS: 05/12/24 brain MRI: 15 mm acute infarct within the left aspect of the pons. 2. Small cortical/subcortical infarct within the posterior right frontal lobe, late subacute or chronic. 3. Chronic lacunar infarcts within the left corona radiata and within/about the bilateral basal ganglia.  COGNITION: Overall cognitive status: Within functional limits for tasks assessed   SENSATION: Reports episodic tingling sensation down his back and into the R LE when he stands from a nap.  COORDINATION: Alternating pronation/supination: slightly slowed on L UE Alternating toe tap: WNL B Finger to nose: WNL B   MUSCLE TONE: tight musculature in B  LEs but does not seem neurologic in nature  POSTURE: rounded shoulders  LOWER EXTREMITY ROM:     Active   Right Eval Left Eval  Hip flexion    Hip extension    Hip abduction    Hip adduction    Hip internal rotation    Hip external rotation    Knee flexion    Knee extension    Ankle dorsiflexion -2 10  Ankle plantarflexion    Ankle inversion    Ankle eversion     (Blank rows = not tested)  LOWER EXTREMITY MMT:    MMT (in sitting) Right Eval Left Eval  Hip flexion 4 4+  Hip extension    Hip abduction 4- 4+  Hip adduction 4 4  Hip internal rotation    Hip external rotation    Knee flexion 4- 4  Knee extension 4+ 4+  Ankle dorsiflexion 2+ (d/t reduced ROM) 4+  Ankle plantarflexion 4+ 4+  Ankle inversion    Ankle eversion    (Blank rows = not tested)  GAIT: Findings: Assistive device utilized:hovering walker above the floor, Level of assistance: SBA, and Comments: antalgia and reduced stance time on R LE with lateral lean and mild instability   FUNCTIONAL TESTS:  5 times sit to stand: 19.05 sec with B armrests; unable to stand without UE support  10 meter walk test: 15.6 sec with pt hovering RW above the floor (2.1 ft/sec)   [facility] PT Assessment - 05/17/24 0001       Standardized Balance Assessment   Standardized Balance Assessment Berg Balance Test      Berg Balance Test   Sit to Stand Able to stand  independently using hands    Standing Unsupported Able to stand safely 2 minutes    Sitting with Back Unsupported but Feet Supported on Floor or Stool Able to sit safely and securely 2 minutes    Stand to Sit Sits safely with minimal use of hands    Transfers Able to transfer safely, definite need of hands    Standing Unsupported with Eyes Closed Able to stand 10 seconds safely    Standing Unsupported with Feet Together Able to place feet together independently and stand for 1 minute with supervision    From Standing, Reach Forward with Outstretched Arm Can reach forward >12 cm safely (5)    From Standing Position, Pick up Object from Floor Able to pick up shoe, needs  supervision    From Standing Position, Turn to Look Behind Over each Shoulder Needs supervision when turning    Turn 360 Degrees Able to turn 360 degrees safely but slowly    Standing Unsupported, Alternately Place Feet on Step/Stool Able to complete 4 steps without aid or supervision    Standing Unsupported, One Foot in Front Able to plae foot ahead of the other independently and hold 30 seconds    Standing on One Leg Tries to lift leg/unable to hold 3 seconds but remains standing independently    Total Score 40  TREATMENT DATE: 05/17/24    PATIENT EDUCATION: Education details: prognosis, POC, edu on exam findings; ed on falls risk according to balance testing and recommended proper use of RW Person educated: Patient Education method: Explanation, Tactile cues, and Verbal cues Education comprehension: verbalized understanding and returned demonstration  HOME EXERCISE PROGRAM: Not yet initiated   GOALS: Goals reviewed with patient? Yes  SHORT TERM GOALS: Target date: 06/07/2024  Patient to be independent with initial HEP. Baseline: HEP initiated Goal status: INITIAL    LONG TERM GOALS: Target date: 07/12/2024  Patient to be independent with advanced HEP. Baseline: Not yet initiated  Goal status: INITIAL  Patient to demonstrate R LE strength >/=4/5.  Baseline: See above Goal status: INITIAL  Patient to demonstrate R ankle DF AROM to at least 5 degrees to improve efficiency of gait.  Baseline: -2 deg Goal status: INITIAL  Demo improved gait speed and endurance by ambulating distance of 1,200 ft during Borg RPE not exceeding 13/20 Baseline: 900 ft, no AD Goal status: INITIAL  Patient to demonstrate gait speed of 2.7 ft/sec with LRAD in order to improve access to community.   Baseline: 2.1 ft/sec Goal status: INITIAL  Patient to  demonstrate 5xSTS test in <15 sec in order to decrease risk of falls.  Baseline: 19.05 sec Goal status: INITIAL  Patient to score at least 46/56 on Berg in order to decrease risk of falls.  Baseline: 40 Goal status: INITIAL    ASSESSMENT:  CLINICAL IMPRESSION:  with distance 900 ft which is far below average of 1700 ft of age/condition-matched peers but no overt exertion and mainly decreased gait speed. Gait training w/ trekking poles to improve stability and sequence to enable faster pace walking with increase in speed to 2.5 ft/sec w/ device vs without 2.1 ft/sec. Instructed in static multisensory balance activities for HEP w/ mod-severe sway eyes closed and head movement conditions. Sit to stand 3x10 reps for HEP to improve BLE strength and able to peform w/out UE support. Continued sessions to progress POC details to improve mobility   OBJECTIVE IMPAIRMENTS: Abnormal gait, decreased activity tolerance, decreased balance, decreased coordination, decreased endurance, difficulty walking, decreased ROM, decreased strength, decreased safety awareness, impaired perceived functional ability, impaired flexibility, and postural dysfunction.   ACTIVITY LIMITATIONS: carrying, lifting, bending, standing, squatting, stairs, transfers, bathing, toileting, dressing, reach over head, hygiene/grooming, and locomotion level  PARTICIPATION LIMITATIONS: meal prep, cleaning, laundry, driving, shopping, community activity, occupation, yard work, and church  PERSONAL FACTORS: Age, Past/current experiences, Profession, Time since onset of injury/illness/exacerbation, and 3+ comorbidities: ADD, HLD, HTN, L3/4 laminectomy 2019 are also affecting patient's functional outcome.   REHAB POTENTIAL: Good  CLINICAL DECISION MAKING: Evolving/moderate complexity  EVALUATION COMPLEXITY: Moderate  PLAN:  PT FREQUENCY: 2x/week  PT DURATION: 8 weeks  PLANNED INTERVENTIONS: 97164- PT Re-evaluation, 97750-  Physical Performance Testing, 97110-Therapeutic exercises, 97530- Therapeutic activity, [id number]- Neuromuscular re-education, 97535- Self Care, 02859- Manual therapy, [id number]- Gait training, [id number]- Orthotic Initial, [id number]- Orthotic/Prosthetic subsequent, [phone]- Canalith repositioning, [phone]- Aquatic Therapy, Patient/Family education, Balance training, Stair training, Taping, Vestibular training, DME instructions, Cryotherapy, and Moist heat  PLAN FOR NEXT SESSION: [place] and progress HEP for R LE strengthening, STS, narrow BOS and dynamic balance   2:47 PM, 05/23/24 M. Kelly Vernor Monnig, PT, DPT Physical Therapist- [place] Office Number: [phone]

## 2024-05-24 ENCOUNTER — Telehealth: Payer: Self-pay | Admitting: Family Medicine

## 2024-05-24 NOTE — Therapy (Signed)
 OUTPATIENT PHYSICAL THERAPY NEURO TREATMENT   Patient Name: Jon Spencer MRN: [id number] DOB:09-11-1951, 73 y.o., male Today's Date: 05/25/2024   PCP:    Micheal Wolm ORN, MD   REFERRING PROVIDER:   [name] LABOR, NP    END OF SESSION:  PT End of Session - 05/25/24 1401     Visit Number 3    Number of Visits 17    Date for PT Re-Evaluation 07/12/24    Authorization Type [employer]    PT Start Time 1312    PT Stop Time 1358    PT Time Calculation (min) 46 min    Equipment Utilized During Treatment Gait belt    Activity Tolerance Patient tolerated treatment well    Behavior During Therapy WFL for tasks assessed/performed           Past Medical History:  Diagnosis Date   ADD 01/17/2010   Complication of anesthesia    takes awhile to wake up   History of back surgery 07/2018   Tumor removal   History of kidney stones    pt. 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/01/2010   OSTEOARTHRITIS, HAND 04/17/2010   TINNITUS 06/26/2009   TRIGGER FINGER 01/18/2009   Past Surgical History:  Procedure Laterality Date   APPENDECTOMY  1969   COLONOSCOPY     LAMINECTOMY N/A 07/12/2018   Procedure: Lumbar three-four Laminectomy for resection of intradural tumor;  Surgeon: Colon Shove, MD;  Location: [facility] OR;  Service: Neurosurgery;  Laterality: N/A;   VASECTOMY  1992   Patient Active Problem List   Diagnosis Date Noted   Dysphagia 05/14/2024   CKD (chronic kidney disease) 05/14/2024   Obesity 05/14/2024   Stroke determined by clinical assessment (HCC) 05/12/2024   Acute ischemic stroke (HCC) 05/12/2024   Aortic valve stenosis 05/05/2023   SOB (shortness of breath) 05/05/2023   Ganglion cyst of flexor tendon sheath of finger of left hand 04/29/2021   De Quervain's tenosynovitis, right 04/25/2020   Schwannoma 07/12/2018   Hypogonadism male 04/10/2011   IMPOTENCE OF ORGANIC ORIGIN 10/01/2010    Osteoarthrosis, hand 04/17/2010   Attention deficit disorder 01/17/2010   Tinnitus 06/26/2009   Hyperlipidemia 01/18/2009   Essential hypertension 01/18/2009   Trigger finger, acquired 01/18/2009    ONSET DATE: 05/12/24  REFERRING DIAG: I63.9 (ICD-10-CM) - Acute CVA (cerebrovascular accident)  THERAPY DIAG:  Unsteadiness on feet  Muscle weakness (generalized)  Other abnormalities of gait and mobility  Other lack of coordination  Rationale for Evaluation and Treatment: Rehabilitation  SUBJECTIVE:  SUBJECTIVE STATEMENT: No walker today. Tried the walking sticks last time but I didn't need them. Patient reports that he has improved a lot since his last session and is not sure why he is still coming here. He reports that she got a note to return to work on Monday.    Pt accompanied by: self  PERTINENT HISTORY: ADD, HLD, HTN, L3/4 laminectomy 2019  PAIN:  Are you having pain? No  PRECAUTIONS: Fall  RED FLAGS: None   WEIGHT BEARING RESTRICTIONS: No  FALLS: Has patient fallen in last 6 months? 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 Requires cues for reaching strategy.  Limited control of the R LE  Romberg on foam EO/EC Romberg on foam + ant/pos wt shifts Pt with tendency to stand back on heels, encouraged anterior wt shift to recover balance. Improved use of reaching strategy   Squat to elevated mat 10x   Cueing for foot positioning, some limited control   R step ups, then alternating steps on 6 step  CGA. Pt catching toes occasionally and requires cues for safe foot placement   Backwards walking , then walking EC Good stability EC, tendency for posterior wt shift and short steps backwards               HOME EXERCISE PROGRAM Last updated: 05/25/24 Access Code: [facility] URL: [email or web address] Date: 05/25/2024 Prepared by: [facility] - Outpatient  Rehab - [facility]  Program Notes perform standing exercises near counter top for safety  Exercises - Corner Balance Feet Together With Eyes Open  - 1 x daily - 7 x weekly - 3 sets - 30 sec hold - Corner Balance Feet Together With Eyes Closed  - 1 x daily - 7 x weekly - 3 sets - 30 sec hold - Corner Balance Feet Together: Eyes Open With Head Turns  - 1 x daily - 7 x weekly - 3 sets - 30 sec hold - Corner Balance Feet Together: Eyes Closed With Head Turns  - 1 x daily - 7 x weekly - 3 sets - 30 sec hold - Alternating Step Forward with Support  - 1 x daily - 5 x weekly - 2 sets - 10 reps - Standing Toe Taps  - 1 x daily - 5 x weekly - 2 sets - 10 reps - Squat with Chair Touch  - 1 x daily - 5 x weekly - 2 sets - 10 reps    PATIENT EDUCATION: Education details: HEP update stressing safety measures  Person educated: Patient Education method: Explanation, Demonstration, Tactile cues, Verbal cues, and Handouts Education comprehension: verbalized understanding and returned demonstration     Note: Objective measures were completed at Evaluation unless otherwise noted.  DIAGNOSTIC FINDINGS: 05/12/24 brain MRI: 15 mm acute infarct within the left aspect of the pons. 2. Small  cortical/subcortical infarct within the posterior right frontal lobe, late subacute  or chronic. 3. Chronic lacunar infarcts within the left corona radiata and within/about the bilateral basal ganglia.  COGNITION: Overall cognitive status: Within functional limits for tasks assessed   SENSATION: Reports episodic tingling sensation down his back and into the R LE when he stands from a nap.  COORDINATION: Alternating pronation/supination: slightly slowed on L UE Alternating toe tap: WNL B Finger to nose: WNL B   MUSCLE TONE: tight musculature in B LEs but does not seem neurologic in nature  POSTURE: rounded shoulders  LOWER EXTREMITY ROM:     Active  Right Eval Left Eval  Hip flexion    Hip extension    Hip abduction    Hip adduction    Hip internal rotation    Hip external rotation    Knee flexion    Knee extension    Ankle dorsiflexion -2 10  Ankle plantarflexion    Ankle inversion    Ankle eversion     (Blank rows = not tested)  LOWER EXTREMITY MMT:    MMT (in sitting) Right Eval Left Eval  Hip flexion 4 4+  Hip extension    Hip abduction 4- 4+  Hip adduction 4 4  Hip internal rotation    Hip external rotation    Knee flexion 4- 4  Knee extension 4+ 4+  Ankle dorsiflexion 2+ (d/t reduced ROM) 4+  Ankle plantarflexion 4+ 4+  Ankle inversion    Ankle eversion    (Blank rows = not tested)  GAIT: Findings: Assistive device utilized:hovering walker above the floor, Level of assistance: SBA, and Comments: antalgia and reduced stance time on R LE with lateral lean and mild instability   FUNCTIONAL TESTS:  5 times sit to stand: 19.05 sec with B armrests; unable to stand without UE support  10 meter walk test: 15.6 sec with pt hovering RW above the floor (2.1 ft/sec)   [facility] PT Assessment - 05/17/24 0001       Standardized Balance Assessment   Standardized Balance Assessment Berg Balance Test      Berg Balance Test   Sit to Stand Able to stand   independently using hands    Standing Unsupported Able to stand safely 2 minutes    Sitting with Back Unsupported but Feet Supported on Floor or Stool Able to sit safely and securely 2 minutes    Stand to Sit Sits safely with minimal use of hands    Transfers Able to transfer safely, definite need of hands    Standing Unsupported with Eyes Closed Able to stand 10 seconds safely    Standing Unsupported with Feet Together Able to place feet together independently and stand for 1 minute with supervision    From Standing, Reach Forward with Outstretched Arm Can reach forward >12 cm safely (5)    From Standing Position, Pick up Object from Floor Able to pick up shoe, needs supervision    From Standing Position, Turn to Look Behind Over each Shoulder Needs supervision when turning    Turn 360 Degrees Able to turn 360 degrees safely but slowly    Standing Unsupported, Alternately Place Feet on Step/Stool Able to complete 4 steps without aid or supervision    Standing Unsupported, One Foot in Front Able to plae foot ahead of the other independently and hold 30 seconds    Standing on One Leg Tries to lift leg/unable to hold 3 seconds but remains standing independently    Total Score 40  TREATMENT DATE: 05/17/24    PATIENT EDUCATION: Education details: prognosis, POC, edu on exam findings; ed on falls risk according to balance testing and recommended proper use of RW Person educated: Patient Education method: Explanation, Tactile cues, and Verbal cues Education comprehension: verbalized understanding and returned demonstration  HOME EXERCISE PROGRAM: Not yet initiated   GOALS: Goals reviewed with patient? Yes  SHORT TERM GOALS: Target date: 06/07/2024  Patient to be independent with initial HEP. Baseline: HEP initiated Goal status: MET    LONG TERM GOALS:  Target date: 07/12/2024  Patient to be independent with advanced HEP. Baseline: Not yet initiated  Goal status: IN PROGRESS  Patient to demonstrate R LE strength >/=4/5.  Baseline: See above Goal status: IN PROGRESS  Patient to demonstrate R ankle DF AROM to at least 5 degrees to improve efficiency of gait.  Baseline: -2 deg Goal status: IN PROGRESS  Demo improved gait speed and endurance by ambulating distance of 1,200 ft during Borg RPE not exceeding 13/20 Baseline: 900 ft, no AD Goal status: IN PROGRESS  Patient to demonstrate gait speed of 2.7 ft/sec with LRAD in order to improve access to community.   Baseline: 2.1 ft/sec Goal status: IN PROGRESS  Patient to demonstrate 5xSTS test in <15 sec in order to decrease risk of falls.  Baseline: 19.05 sec Goal status: IN PROGRESS  Patient to score at least 46/56 on Berg in order to decrease risk of falls.  Baseline: 40 Goal status: IN PROGRESS    ASSESSMENT:  CLINICAL IMPRESSION: Patient arrived to session without AD, stating that he does not need one. He reports improvement since initial eval and unsure if should proceed with therapies. Worked on progressive functional strengthening and balance tasks. Admittedly, patient's balance and ability to perform STS transfers has improved quite a bit, however patient still demonstrates remaining limited safety awareness and insight into deficits, tendency for posterior LOB, and R LE discoordination/instability. Patient agreeable to proceed with PT for balance and reported understanding of edu provided today.    OBJECTIVE IMPAIRMENTS: Abnormal gait, decreased activity tolerance, decreased balance, decreased coordination, decreased endurance, difficulty walking, decreased ROM, decreased strength, decreased safety awareness, impaired perceived functional ability, impaired flexibility, and postural dysfunction.   ACTIVITY LIMITATIONS: carrying, lifting, bending, standing, squatting,  stairs, transfers, bathing, toileting, dressing, reach over head, hygiene/grooming, and locomotion level  PARTICIPATION LIMITATIONS: meal prep, cleaning, laundry, driving, shopping, community activity, occupation, yard work, and church  PERSONAL FACTORS: Age, Past/current experiences, Profession, Time since onset of injury/illness/exacerbation, and 3+ comorbidities: ADD, HLD, HTN, L3/4 laminectomy 2019 are also affecting patient's functional outcome.   REHAB POTENTIAL: Good  CLINICAL DECISION MAKING: Evolving/moderate complexity  EVALUATION COMPLEXITY: Moderate  PLAN:  PT FREQUENCY: 2x/week  PT DURATION: 8 weeks  PLANNED INTERVENTIONS: [id number]- PT Re-evaluation, 97750- Physical Performance Testing, 97110-Therapeutic exercises, 97530- Therapeutic activity, [id number]- Neuromuscular re-education, 97535- Self Care, 02859- Manual therapy, [id number]- Gait training, [phone]- Orthotic Initial, [phone]- Orthotic/Prosthetic subsequent, [phone]- Canalith repositioning, [phone]- Aquatic Therapy, Patient/Family education, Balance training, Stair training, Taping, Vestibular training, DME instructions, Cryotherapy, and Moist heat  PLAN FOR NEXT SESSION: [place] and progress HEP for R LE strengthening, STS, narrow BOS and dynamic balance     Louana Terrilyn Christians, PT, DPT 05/25/24 2:05 PM  [facility] at [facility] [address] [place], [place] [address] Phone # [phone] Fax # [phone]

## 2024-05-24 NOTE — Telephone Encounter (Signed)
 Copied from CRM [id number]. Topic: General - Other >> May 24, 2024  8:43 AM Viola F wrote: Reason for CRM: Patient needs letter to say he can return back to work July 28th on Monday. He met with PT specialist yesterday and says everything went well - he would like to come and pick up the letter. Please call [phone]

## 2024-05-25 ENCOUNTER — Telehealth: Payer: Self-pay | Admitting: *Deleted

## 2024-05-25 ENCOUNTER — Ambulatory Visit: Admitting: Physical Therapy

## 2024-05-25 ENCOUNTER — Encounter: Payer: Self-pay | Admitting: Family Medicine

## 2024-05-25 ENCOUNTER — Encounter: Payer: Self-pay | Admitting: Physical Therapy

## 2024-05-25 DIAGNOSIS — M6281 Muscle weakness (generalized): Secondary | ICD-10-CM

## 2024-05-25 DIAGNOSIS — R278 Other lack of coordination: Secondary | ICD-10-CM

## 2024-05-25 DIAGNOSIS — R2681 Unsteadiness on feet: Secondary | ICD-10-CM

## 2024-05-25 DIAGNOSIS — R2689 Other abnormalities of gait and mobility: Secondary | ICD-10-CM

## 2024-05-25 DIAGNOSIS — I69353 Hemiplegia and hemiparesis following cerebral infarction affecting right non-dominant side: Secondary | ICD-10-CM | POA: Diagnosis not present

## 2024-05-25 NOTE — Telephone Encounter (Signed)
 Copied from CRM [id number]. Topic: General - Other >> May 24, 2024  8:43 AM Viola F wrote: Reason for CRM: Patient needs letter to say he can return back to work July 28th on Monday. He met with PT specialist yesterday and says everything went well - he would like to come and pick up the letter. Please call [phone] >> May 25, 2024  8:15 AM Mercedes MATSU wrote: Patient needs letter to say he can return back to work July 28th on Monday. He met with PT specialist yesterday and says everything went well - he would like to come and pick up the letter. Please call [phone].

## 2024-05-25 NOTE — Telephone Encounter (Signed)
 Patient's wife reported letter was received via Mychart

## 2024-05-29 ENCOUNTER — Ambulatory Visit

## 2024-05-29 ENCOUNTER — Ambulatory Visit: Payer: Self-pay

## 2024-05-29 ENCOUNTER — Encounter: Payer: Self-pay | Admitting: Occupational Therapy

## 2024-05-29 ENCOUNTER — Other Ambulatory Visit: Payer: Self-pay

## 2024-05-29 ENCOUNTER — Ambulatory Visit: Admitting: Occupational Therapy

## 2024-05-29 DIAGNOSIS — R1312 Dysphagia, oropharyngeal phase: Secondary | ICD-10-CM

## 2024-05-29 DIAGNOSIS — I69353 Hemiplegia and hemiparesis following cerebral infarction affecting right non-dominant side: Secondary | ICD-10-CM

## 2024-05-29 DIAGNOSIS — R471 Dysarthria and anarthria: Secondary | ICD-10-CM

## 2024-05-29 DIAGNOSIS — R278 Other lack of coordination: Secondary | ICD-10-CM

## 2024-05-29 NOTE — Therapy (Signed)
 OUTPATIENT SPEECH LANGUAGE PATHOLOGY EVALUATION   Patient Name: Jon Spencer MRN: [id number] DOB:Nov 08, 1950, 73 y.o., male Today's Date: 05/29/2024  PCP: [name], MD REFERRING PROVIDER: [name] LABOR, NP [name], B., MD - doc)  END OF SESSION:  End of Session - 05/29/24 1221     Visit Number 1    Number of Visits 17    Date for SLP Re-Evaluation 07/28/24   due to first ST 06/05/24   SLP Start Time 1104    SLP Stop Time  1150    SLP Time Calculation (min) 46 min    Activity Tolerance Patient tolerated treatment well          Past Medical History:  Diagnosis Date   ADD 01/17/2010   Complication of anesthesia    takes awhile to wake up   History of back surgery 07/2018   Tumor removal   History of kidney stones    pt. 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/01/2010   OSTEOARTHRITIS, HAND 04/17/2010   TINNITUS 06/26/2009   TRIGGER FINGER 01/18/2009   Past Surgical History:  Procedure Laterality Date   APPENDECTOMY  1969   COLONOSCOPY     LAMINECTOMY N/A 07/12/2018   Procedure: Lumbar three-four Laminectomy for resection of intradural tumor;  Surgeon: Colon Shove, MD;  Location: [facility] OR;  Service: Neurosurgery;  Laterality: N/A;   VASECTOMY  1992   Patient Active Problem List   Diagnosis Date Noted   Dysphagia 05/14/2024   CKD (chronic kidney disease) 05/14/2024   Obesity 05/14/2024   Stroke determined by clinical assessment (HCC) 05/12/2024   Acute ischemic stroke (HCC) 05/12/2024   Aortic valve stenosis 05/05/2023   SOB (shortness of breath) 05/05/2023   Ganglion cyst of flexor tendon sheath of finger of left hand 04/29/2021   De Quervain's tenosynovitis, right 04/25/2020   Schwannoma 07/12/2018   Hypogonadism male 04/10/2011   IMPOTENCE OF ORGANIC ORIGIN 10/01/2010   Osteoarthrosis, hand 04/17/2010   Attention deficit disorder 01/17/2010   Tinnitus 06/26/2009    Hyperlipidemia 01/18/2009   Essential hypertension 01/18/2009   Trigger finger, acquired 01/18/2009    ONSET DATE: 05/12/24   REFERRING DIAG: I63.9 (ICD-10-CM) - Acute CVA (cerebrovascular accident) (HCC)  THERAPY DIAG:  Dysarthria and anarthria  Dysphagia, oropharyngeal phase  Rationale for Evaluation and Treatment: Rehabilitation  SUBJECTIVE:   SUBJECTIVE STATEMENT: Strange as it is, (my speech intelligibility) comes and goes. Today I was at work since 8, now it's bad. Yesterday not bad at all. 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  Pt accompanied by: self  PERTINENT HISTORY: ADD, HLD, HTN, L3/4 laminectomy 2019  PAIN:  Are you having pain? No  FALLS: Has patient fallen in last 6 months?  No  LIVING ENVIRONMENT: Lives with: lives with their spouse Lives in: House/apartment  PLOF:  Level of assistance: Independent with ADLs, Independent with IADLs Employment: Full-time employment Airline pilot of heavy industrial equipment  PATIENT GOALS: My voice is my product. Pt would like to see his speech improve.   OBJECTIVE:  Note: Objective measures were completed at Evaluation unless otherwise noted.  DIAGNOSTIC FINDINGS:  MRI - 05/12/24: IMPRESSION: 1. 15 mm acute infarct within the left aspect of the pons. 2. Small cortical/subcortical infarct within the posterior right frontal lobe, late subacute or chronic. 3. Chronic lacunar infarcts within the left corona radiata and within/about the bilateral basal ganglia. 4. Background moderate-to-advanced cerebral white  matter chronic small vessel ischemic disease. 5. Mild generalized cerebral atrophy.   COGNITION: Overall cognitive status: Within functional limits for tasks assessed Comments: Pt with premorbid dx of ADD. Attention appears functional today during eval. Pt states med administration and appointment tracking have not had errors in the last 1-2 weeks.   AUDITORY COMPREHENSION: Overall auditory comprehension:  Appears intact YES/NO questions: Appears intact Following directions: Appears intact Conversation: Moderately Complex  READING COMPREHENSION: Intact  EXPRESSION: verbal  VERBAL EXPRESSION: Level of generative/spontaneous verbalization: conversation Automatic speech: day of week: intact and month of year: intact  Repetition: Appears intact (no aphasic errors seen - just dysarthric) Naming: Divergent: 100% Pragmatics: Appears intact  WRITTEN EXPRESSION: Dominant hand: right Written expression: Appears intact  MOTOR SPEECH: Overall motor speech: impaired Level of impairment: Phrase, Sentence, and Conversation Respiration: thoracic breathing Phonation: normal Resonance: WFL Articulation: Impaired: phrase, sentence, and conversation Intelligibility: Intelligibility reduced - mildly (90-95%) Motor planning: Appears intact Effective technique: slow rate and over articulate  ORAL MOTOR EXAMINATION: Overall status: Impaired: Labial: Bilateral (Strength) Lingual: Bilateral (Strength and Coordination) Comments: Rt lingual strength more impaired than lt. Pt with bil labial weakness. Is having medial labial leakage with liquids. Doing more coughing than normal with meals.   RECOMMENDATIONS FROM OBJECTIVE SWALLOW STUDY (MBSS/FEES):  None noted to date   STANDARDIZED ASSESSMENTS: None for dysarthria  PATIENT REPORTED OUTCOME MEASURES (PROM): Communication Effectiveness Survey: provided in first 1-2 visits                                                                                                                            TREATMENT DATE:   05/29/24: SLP guided pt through a HEP for speech intelligibility and for oral motor strengthening.  PATIENT EDUCATION: Education details: results of eval, likely need for MBSS, rationale for MBSS, etiology of pt's dysarthria, stamina re: speech  Person educated: Patient Education method: Explanation, Demonstration, Verbal cues, and  Handouts Education comprehension: verbalized understanding, returned demonstration, verbal cues required, and needs further education   GOALS: Goals reviewed with patient? Yes, generally  SHORT TERM GOALS: Target date: 07/31/24 (due to pt's first ST 06/05/24)  Pt will undergo clinical swallow assessment, and a MBSS if clinically indicated Baseline: Goal status: INITIAL  2.  Pt will complete HEP for dysarthria with mod I in 2 sessions Baseline:  Goal status: INITIAL  3.  Pt will complete HEP for oral motor strength with mod I in 2 sessions Baseline:  Goal status: INITIAL  4.  Pt will demo speech compensations in sentence responses 90% success in 2 sessions Baseline:  Goal status: INITIAL   LONG TERM GOALS: Target date: 07/28/24  Pt's PROM will improve from initial administration Baseline:  Goal status: INITIAL  2.  Pt will complete HEP for dysarthria with in 2 sessions Baseline:  Goal status: INITIAL  3.  Pt will complete HEP for oral motor strength in 2 sessions Baseline:  Goal status: INITIAL  4.  Pt  will demo speech compensations, sufficient for pt satisfaction about his speech, in 10 minutes mod complex conversation, in 3 sessions Baseline:  Goal status: INITIAL   ASSESSMENT:  CLINICAL IMPRESSION: Patient is a 73 y.o. male who was seen today for assessment of speech intelligibility after CVA May 12, 2024. 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 Today SLP educated pt about stamina using his speech as he talked for 50% of the time he was back at work (today is first day back), and pt stated his speech was bad today only after 3 hours at work. For additional information re: education see pt education above. Pt reports some additional coughing and labial leakage with meals since that date. Pt will undergo clinical swallow assessment next visit, with an MBSS scheduled if SLP deems clinically appropriate.   OBJECTIVE  IMPAIRMENTS: include dysarthria and dysphagia. These impairments are limiting patient from return to work, household responsibilities, ADLs/IADLs, effectively communicating at home and in community, and safety when swallowing. Factors affecting potential to achieve goals and functional outcome are cooperation/participation level due to HEP completion necessary for improvement. Patient will benefit from skilled SLP services to address above impairments and improve overall function.  REHAB POTENTIAL: Good  PLAN:  SLP FREQUENCY: 1-2x/week  SLP DURATION: 8 weeks  PLANNED INTERVENTIONS: Aspiration precaution training, Pharyngeal strengthening exercises, Diet toleration management , Environmental controls, Trials of upgraded texture/liquids, Cueing hierachy, Internal/external aids, Oral motor exercises, Functional tasks, SLP instruction and feedback, Compensatory strategies, Patient/family education, [phone] Treatment of speech (30 or 45 min) , 92526 Treatment of swallowing function, and 07389 - evaluation of swallowing (clinical assessment).    Nikko Goldwire, CCC-SLP 05/29/2024, 12:22 PM

## 2024-05-29 NOTE — Therapy (Signed)
 OUTPATIENT OCCUPATIONAL THERAPY NEURO TREATMENT/ DISCHARGE  Patient Name: Jon Spencer MRN: [id number] DOB:1950/12/24, 73 y.o., male Today's Date: 05/29/2024  PCP: Micheal Wolm ORN, MD REFERRING PROVIDER: Rosemarie Eather GORMAN, MD  END OF SESSION:  OT End of Session - 05/29/24 1321     Visit Number 2    Number of Visits 7    Date for OT Re-Evaluation 06/30/24    Authorization Type [employer] [id number]    OT Start Time 1315          Past Medical History:  Diagnosis Date   ADD 01/17/2010   Complication of anesthesia    takes awhile to wake up   History of back surgery 07/2018   Tumor removal   History of kidney stones    pt. 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/01/2010   OSTEOARTHRITIS, HAND 04/17/2010   TINNITUS 06/26/2009   TRIGGER FINGER 01/18/2009   Past Surgical History:  Procedure Laterality Date   APPENDECTOMY  1969   COLONOSCOPY     LAMINECTOMY N/A 07/12/2018   Procedure: Lumbar three-four Laminectomy for resection of intradural tumor;  Surgeon: Colon Shove, MD;  Location: [facility] OR;  Service: Neurosurgery;  Laterality: N/A;   VASECTOMY  1992   Patient Active Problem List   Diagnosis Date Noted   Dysphagia 05/14/2024   CKD (chronic kidney disease) 05/14/2024   Obesity 05/14/2024   Stroke determined by clinical assessment (HCC) 05/12/2024   Acute ischemic stroke (HCC) 05/12/2024   Aortic valve stenosis 05/05/2023   SOB (shortness of breath) 05/05/2023   Ganglion cyst of flexor tendon sheath of finger of left hand 04/29/2021   De Quervain's tenosynovitis, right 04/25/2020   Schwannoma 07/12/2018   Hypogonadism male 04/10/2011   IMPOTENCE OF ORGANIC ORIGIN 10/01/2010   Osteoarthrosis, hand 04/17/2010   Attention deficit disorder 01/17/2010   Tinnitus 06/26/2009   Hyperlipidemia 01/18/2009   Essential hypertension 01/18/2009   Trigger finger, acquired 01/18/2009    ONSET DATE:  05/12/24  REFERRING DIAG: Rosemarie Eather GORMAN, MD  THERAPY DIAG:  Other lack of coordination  Hemiplegia and hemiparesis following cerebral infarction affecting right non-dominant side (HCC)  Rationale for Evaluation and Treatment: Rehabilitation  SUBJECTIVE:   SUBJECTIVE STATEMENT: 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 Pt report that he is a little wobbly as he walks, notices that the balance is not what it once was.  Pt and spouse note that his Right side is a little weaker than it was. Pt accompanied by: self (wife remained in waiting room)  PERTINENT HISTORY: ADD, HLD, HTN, L3/4 laminectomy 2019   73 y.o. male who presented 05/12/24 with sudden onset slurred speech. Pt was also noted to have intermittent shuffling gait for ~3 days PTA. CT head revealed no acute abnormality, but CT angiogram revealed near occlusive stenosis of P1 segment of left PCA and moderate stenosis of mid basilar artery. Pt received TNK. MRI showed acute infarct within the L pons, late subacute or chronic small cortical/subcortical infarct within the posterior R frontal lobe, and chronic lacunar infarcts within the L corona radiata and within/about the bilateral basal ganglia.  PRECAUTIONS: Fall  WEIGHT BEARING RESTRICTIONS: No  PAIN:  Are you having pain? No  FALLS: Has patient fallen in last 6 months? 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  HAND DOMINANCE: Left  ADLs: Overall ADLs: it just takes longer Transfers/ambulation related to ADLs: using RW in the home, OF NOTE pt was carrying RW during ambulation  Eating: Mod I Grooming: Mod I UB Dressing: Mod I LB Dressing: Mod I Toileting: Mod I Bathing: Mod I - Supervision Tub Shower transfers: wife providing supervision for transfers, use lawn chair in shower for safety when bathing Equipment: bed side commode  IADLs: Light housekeeping: spouse performs all these tasks Meal Prep: was cooking prior to CVA but has not returned to that yet Community mobility: MD encouraged pt to use his judgement prior to return to driving Medication management: Mod I Financial management: Spouse pays the bills  MOBILITY STATUS: recommended to use RW for mobility, OF NOTE pt carrying RW during ambulation despite cues to use appropriately  POSTURE COMMENTS:  rounded shoulders  ACTIVITY TOLERANCE: Activity tolerance: diminished, reports getting fatigued more quickly  FUNCTIONAL OUTCOME MEASURES: Quick Dash: 18.2   UPPER EXTREMITY ROM:    Active ROM Right eval Left eval  Shoulder flexion 140 160  Shoulder abduction    Shoulder adduction    Shoulder extension    Shoulder internal rotation [facility] WFL  Shoulder external rotation [facility] WFL  Elbow flexion WFL WFL  Elbow extension [facility] WFL  Wrist flexion    Wrist extension    Wrist ulnar deviation    Wrist radial deviation    Wrist pronation    Wrist supination    (Blank rows = not tested)  UPPER EXTREMITY MMT:     MMT Right eval Left eval  Shoulder flexion 4- 4+  Shoulder abduction    Shoulder adduction    Shoulder extension    Shoulder internal rotation    Shoulder external rotation    Middle trapezius    Lower trapezius    Elbow flexion    Elbow extension    Wrist flexion    Wrist extension    Wrist  ulnar deviation    Wrist radial deviation    Wrist pronation    Wrist supination    (Blank rows = not tested)  HAND FUNCTION: Grip strength: Right: 40, 26, 26 (30.6# average) lbs; Left: 50, 46, 45 (47# average) lbs  COORDINATION: Finger Nose Finger test: [facility] 9 Hole Peg test: Right: 54.41 sec; Left: 35.94 sec Box and Blocks:  Right 36 blocks, Left 44 blocks Mild decreased gross motor control with supination/pronation  SENSATION: WFL  COGNITION: Overall cognitive status: reports not a great memory but no changes  VISION: Subjective report: no changes Baseline vision: Wears glasses for reading only Visual history: had shingles in his L eye and now has 2 scabs on eye ball, but no changes in vision  TREATMENT DATE:   05/29/24:   Patient reports returning to driving and returning to work - these were the primary goals for OT.  He shows significant improvement in both coordination and strength.  Patient declined HEP for coordiantion/ strength.   Encouraged patient to call PCP as he had what he believes to be an allergic reaction this am - bottom lip swollen and describes itchiness.  Patient took Benadryl which helped the itching - but lip still swollen.  Patient took a second Benadryl.  Discussed with patient that he should consult with his PCP to determine 1)  If safe to take Benadryl and 2) to see why he may have had an allergic reaction.  Checked goals and decided with patient to stop further OT.  Patient needed to end session early due to having a meeting at the airport at 2pm.   05/17/24 Educated on fine motor coordination activities to complete at home to continue to address impaired coordination.  OT providing with handout of activities both functional in nature and structured tasks to challenge coordination.          PATIENT EDUCATION: Education  details: Educated on role and purpose of OT as well as potential interventions and goals for therapy based on initial evaluation findings. Person educated: Patient Education method: Explanation, Verbal cues, and Handouts Education comprehension: verbalized understanding and needs further education  HOME EXERCISE PROGRAM: 05/17/24 - Fine motor coordination tasks (see pt instructions)   GOALS: Goals reviewed with patient? Yes  SHORT TERM GOALS: Target date: 06/09/24  Pt will be independent in fine motor coordination HEP. Baseline:new to OPOT Goal status: MET  2.  Pt will verbalize understanding of energy conservation strategies to facilitate safe return to work. Baseline: reports decreased endurance Goal status: MET  3.  Pt will verbalize understanding of RTW recommendations. Baseline: expressing desire to RTW Goal status: MET    LONG TERM GOALS: Target date: 06/30/24  Pt will demonstrate improved fine motor coordination as needed for ADLs as evidenced by increased score on 9 hole peg test by 8 seconds with RUE. Baseline: 9 Hole Peg test: Right: 54.41 sec; Left: 35.94 sec Goal status: MET 36.42 RIGHT!  2.  Pt will demonstrate improved grip strength to >35# average with RUE to allow for increased ease with opening containers Baseline: Grip strength: Right: 40, 26, 26 (30.6# average) lbs; Left: 50, 46, 45 (47# average) lbs Goal status: MET   35, 36, 40  3.  Pt will verbalize understanding of return to driving recommendations. Baseline: not cleared to drive Goal status: MET - Cleared to drive by MD  4.  Patient will demonstrate improved standing tolerance by engaging in 15 mins standing functional tasks to demonstrate improved balance and activity tolerance to allow safe RTW. Baseline: decreased endurance Goal status: MET  5.  Pt will demonstrate improved functional use of RUE as evidenced by improved score on Quick Dash to <14% impairment. Baseline: 18.2% Goal status: Not  retested due to time constraints    ASSESSMENT:  CLINICAL IMPRESSION: Patient has returned to work full time.  First day back was today.  Patient aware of his new medications.  Patient indicated that he was cleared by PCP to return to driving to work and out to lunch.  Patient sees no need to continue OT in reviewing OT goals, patient has met most goals and is appropriate for OT discharge.  Reinforced that he could still benefit from PT and SLP.     PERFORMANCE DEFICITS:  in functional skills including ADLs, IADLs, coordination, strength, Fine motor control, Gross motor control, balance, body mechanics, endurance, decreased knowledge of precautions, decreased knowledge of use of DME, and UE functional use and psychosocial skills including routines and behaviors.   IMPAIRMENTS: are limiting patient from ADLs, IADLs, and work.   CO-MORBIDITIES: may have co-morbidities  that affects occupational performance. Patient will benefit from skilled OT to address above impairments and improve overall function.  MODIFICATION OR ASSISTANCE TO COMPLETE EVALUATION: Min-Moderate modification of tasks or assist with assess necessary to complete an evaluation.  OT OCCUPATIONAL PROFILE AND HISTORY: Detailed assessment: Review of records and additional review of physical, cognitive, psychosocial history related to current functional performance.  CLINICAL DECISION MAKING: LOW - limited treatment options, no task modification necessary  REHAB POTENTIAL: Good  EVALUATION COMPLEXITY: Low    PLAN:  OT FREQUENCY: 1-2x/week  OT DURATION: 6 weeks  PLANNED INTERVENTIONS: 02831 OT Re-evaluation, 97535 self care/ADL training, 02889 therapeutic exercise, 97530 therapeutic activity, 97112 neuromuscular re-education, 97032 electrical stimulation (manual), balance training, functional mobility training, energy conservation, coping strategies training, and patient/family education  RECOMMENDED OTHER SERVICES:  SLP  CONSULTED AND AGREED WITH PLAN OF CARE: Patient  PLAN FOR NEXT SESSION: DISCHARGE  Fransico Allean HERO, OTR/L 05/29/2024, 1:44 PM   Outpatient Rehab at [facility] [address], [address] [place], [place] [address] Phone # [phone] Fax # [phone]

## 2024-05-29 NOTE — Patient Instructions (Signed)
 LIP and TONGUE exercises  --- DO ALL EXERCISES TWICE EACH DAY  1. Lip press - Press your lips together firmly (like making a /m/ sound) and hold 5 seconds -repeat 10 times  2. LOUD and LONG Robbi - make a kissing sound as loud as you can, as long as you can - repeat 10 times  3. Tongue sweep - Sweep your tongue in the pockets of your mouth - touch every tooth  - five revolutions each way  4. PUH! - 10 times       MAKE THE INITIAL SOUNDS STRONG      TUH - 10 times      KUH! - 10 times  5. Move a licorice stick from side to side in your mouth - 10 back and forth movements  6. Say OOOOO, and EEEEE 10 times (Kiss and smile)  7. Put air in your cheeks, and push on either side 10 times  *KEEP THE AIR IN and DON'T LET IT ESCAPE!!*

## 2024-05-29 NOTE — Telephone Encounter (Signed)
 FYI Only or Action Required?: Action required by provider: clinical question for provider.  Patient was last seen in primary care on 05/16/2024 by Micheal Wolm ORN, MD.  Called Nurse Triage reporting Medication Reaction.  Symptoms began today.  Interventions attempted: OTC medications: benadryl .  Symptoms are: stable.  Triage Disposition: Call PCP When Office is Open  Patient/caregiver understands and will follow disposition?: YesCopied from CRM [id number]. Topic: Clinical - Red Word Triage >> May 29, 2024  1:49 PM Macario HERO wrote: Red Word that prompted transfer to Nurse Triage: Patient stated that after taking the medications they give him for his stroke his lips are swollen. Reason for Disposition  [1] Caller has NON-URGENT medicine question about med that PCP prescribed AND [2] triager unable to answer question  Answer Assessment - Initial Assessment Questions 1. NAME of MEDICINE: What medicine(s) are you calling about?     Plavix  2. QUESTION: What is your question? (e.g., double dose of medicine, side effect)     Am I allergic to it 3. PRESCRIBER: Who prescribed the medicine? Reason: if prescribed by specialist, call should be referred to that group.     [facility]fe 4. SYMPTOMS: Do you have any symptoms? If Yes, ask: What symptoms are you having?  How bad are the symptoms (e.g., mild, moderate, severe)     Bottom lip swelling and itching on hands and left shoulder   Pt had stroke several weeks ago and has been taking plavix  since.Pt takes medication as prescribed but this morning after taking his bottom lip swelled and hands/shoulders started to itch. Pt took benadryl and itching stopped, but bottom lip is still swollen. No breathing difficulty. No changed in diet/drinks or known injury. Pt just left physical therapy and headed back to work. RN advised to hold medication until PCP reviews. CAL notified so this can be addressed today.  Protocols used: Medication Question  Call-A-AH

## 2024-05-29 NOTE — Therapy (Incomplete)
 OUTPATIENT PHYSICAL THERAPY NEURO TREATMENT   Patient Name: Jon Spencer MRN: [id number] DOB:07/24/1951, 73 y.o., male Today's Date: 05/29/2024   PCP:    Micheal Wolm ORN, MD   REFERRING PROVIDER:   [name] LABOR, NP    END OF SESSION:     Past Medical History:  Diagnosis Date   ADD 01/17/2010   Complication of anesthesia    takes awhile to wake up   History of back surgery 07/2018   Tumor removal   History of kidney stones    pt. 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/01/2010   OSTEOARTHRITIS, HAND 04/17/2010   TINNITUS 06/26/2009   TRIGGER FINGER 01/18/2009   Past Surgical History:  Procedure Laterality Date   APPENDECTOMY  1969   COLONOSCOPY     LAMINECTOMY N/A 07/12/2018   Procedure: Lumbar three-four Laminectomy for resection of intradural tumor;  Surgeon: Colon Shove, MD;  Location: [facility] OR;  Service: Neurosurgery;  Laterality: N/A;   VASECTOMY  1992   Patient Active Problem List   Diagnosis Date Noted   Dysphagia 05/14/2024   CKD (chronic kidney disease) 05/14/2024   Obesity 05/14/2024   Stroke determined by clinical assessment (HCC) 05/12/2024   Acute ischemic stroke (HCC) 05/12/2024   Aortic valve stenosis 05/05/2023   SOB (shortness of breath) 05/05/2023   Ganglion cyst of flexor tendon sheath of finger of left hand 04/29/2021   De Quervain's tenosynovitis, right 04/25/2020   Schwannoma 07/12/2018   Hypogonadism male 04/10/2011   IMPOTENCE OF ORGANIC ORIGIN 10/01/2010   Osteoarthrosis, hand 04/17/2010   Attention deficit disorder 01/17/2010   Tinnitus 06/26/2009   Hyperlipidemia 01/18/2009   Essential hypertension 01/18/2009   Trigger finger, acquired 01/18/2009    ONSET DATE: 05/12/24  REFERRING DIAG: I63.9 (ICD-10-CM) - Acute CVA (cerebrovascular accident)  THERAPY DIAG:  No diagnosis found.  Rationale for Evaluation and Treatment:  Rehabilitation  SUBJECTIVE:                                                                                                                                                                                             SUBJECTIVE STATEMENT: No walker today. Tried the walking sticks last time but I didn't need them. Patient reports that he has improved a lot since his last session and is not sure why he is still coming here. He reports that she got a note to return to work on Monday.    Pt accompanied by: self  PERTINENT HISTORY: ADD, HLD, HTN, L3/4 laminectomy 2019  PAIN:  Are you  having pain? No  PRECAUTIONS: Fall  RED FLAGS: None   WEIGHT BEARING RESTRICTIONS: No  FALLS: Has patient fallen in last 6 months? 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 Requires cues for reaching strategy. Limited control of the R LE  Romberg on foam EO/EC Romberg on foam + ant/pos wt shifts Pt with tendency to stand back on heels, encouraged anterior wt shift to recover balance. Improved use of reaching strategy   Squat to elevated mat 10x   Cueing for foot positioning, some limited control   R step ups, then alternating steps on 6 step  CGA. Pt catching toes occasionally and requires cues for safe foot  placement   Backwards walking , then walking EC Good stability EC, tendency for posterior wt shift and short steps backwards               HOME EXERCISE PROGRAM Last updated: 05/25/24 Access Code: [facility] URL: [email or web address] Date: 05/25/2024 Prepared by: [facility] - Outpatient  Rehab - [facility]  Program Notes perform standing exercises near counter top for safety  Exercises - Corner Balance Feet Together With Eyes Open  - 1 x daily - 7 x weekly - 3 sets - 30 sec hold - Corner Balance Feet Together With Eyes Closed  - 1 x daily - 7 x weekly - 3 sets - 30 sec hold - Corner Balance Feet Together: Eyes Open With Head Turns  - 1 x daily - 7 x weekly - 3 sets - 30 sec hold - Corner Balance Feet Together: Eyes Closed With Head Turns  - 1 x daily - 7 x weekly - 3 sets - 30 sec hold - Alternating Step Forward with Support  - 1 x daily - 5 x weekly - 2 sets - 10 reps - Standing Toe Taps  - 1 x daily - 5 x weekly - 2 sets - 10 reps - Squat with Chair Touch  - 1 x daily - 5 x weekly - 2 sets - 10 reps    PATIENT EDUCATION: Education details: HEP update stressing safety measures  Person educated: Patient Education method: Explanation, Demonstration, Tactile cues, Verbal cues, and Handouts Education comprehension: verbalized understanding and returned demonstration     Note: Objective measures were completed at Evaluation unless otherwise noted.  DIAGNOSTIC FINDINGS: 05/12/24 brain MRI: 15 mm acute infarct within the left aspect of the pons. 2. Small cortical/subcortical infarct within the posterior right frontal lobe, late subacute or chronic. 3. Chronic lacunar infarcts within the left corona radiata and within/about the bilateral basal ganglia.  COGNITION: Overall cognitive status: Within functional limits for tasks assessed   SENSATION: Reports episodic tingling sensation down his back and into the R LE when he  stands from a  nap.  COORDINATION: Alternating pronation/supination: slightly slowed on L UE Alternating toe tap: WNL B Finger to nose: WNL B   MUSCLE TONE: tight musculature in B LEs but does not seem neurologic in nature  POSTURE: rounded shoulders  LOWER EXTREMITY ROM:     Active  Right Eval Left Eval  Hip flexion    Hip extension    Hip abduction    Hip adduction    Hip internal rotation    Hip external rotation    Knee flexion    Knee extension    Ankle dorsiflexion -2 10  Ankle plantarflexion    Ankle inversion    Ankle eversion     (Blank rows = not tested)  LOWER EXTREMITY MMT:    MMT (in sitting) Right Eval Left Eval  Hip flexion 4 4+  Hip extension    Hip abduction 4- 4+  Hip adduction 4 4  Hip internal rotation    Hip external rotation    Knee flexion 4- 4  Knee extension 4+ 4+  Ankle dorsiflexion 2+ (d/t reduced ROM) 4+  Ankle plantarflexion 4+ 4+  Ankle inversion    Ankle eversion    (Blank rows = not tested)  GAIT: Findings: Assistive device utilized:hovering walker above the floor, Level of assistance: SBA, and Comments: antalgia and reduced stance time on R LE with lateral lean and mild instability   FUNCTIONAL TESTS:  5 times sit to stand: 19.05 sec with B armrests; unable to stand without UE support  10 meter walk test: 15.6 sec with pt hovering RW above the floor (2.1 ft/sec)   [facility] PT Assessment - 05/17/24 0001       Standardized Balance Assessment   Standardized Balance Assessment Berg Balance Test      Berg Balance Test   Sit to Stand Able to stand  independently using hands    Standing Unsupported Able to stand safely 2 minutes    Sitting with Back Unsupported but Feet Supported on Floor or Stool Able to sit safely and securely 2 minutes    Stand to Sit Sits safely with minimal use of hands    Transfers Able to transfer safely, definite need of hands    Standing Unsupported with Eyes Closed Able to stand 10 seconds safely    Standing  Unsupported with Feet Together Able to place feet together independently and stand for 1 minute with supervision    From Standing, Reach Forward with Outstretched Arm Can reach forward >12 cm safely (5)    From Standing Position, Pick up Object from Floor Able to pick up shoe, needs supervision    From Standing Position, Turn to Look Behind Over each Shoulder Needs supervision when turning    Turn 360 Degrees Able to turn 360 degrees safely but slowly    Standing Unsupported, Alternately Place Feet on Step/Stool Able to complete 4 steps without aid or supervision    Standing Unsupported, One Foot in Front Able to plae foot ahead of the other independently and hold 30 seconds    Standing on One Leg Tries to lift leg/unable to hold 3 seconds but remains standing independently    Total Score 40  TREATMENT DATE: 05/17/24    PATIENT EDUCATION: Education details: prognosis, POC, edu on exam findings; ed on falls risk according to balance testing and recommended proper use of RW Person educated: Patient Education method: Explanation, Tactile cues, and Verbal cues Education comprehension: verbalized understanding and returned demonstration  HOME EXERCISE PROGRAM: Not yet initiated   GOALS: Goals reviewed with patient? Yes  SHORT TERM GOALS: Target date: 06/07/2024  Patient to be independent with initial HEP. Baseline: HEP initiated Goal status: MET    LONG TERM GOALS: Target date: 07/12/2024  Patient to be independent with advanced HEP. Baseline: Not yet initiated  Goal status: IN PROGRESS  Patient to demonstrate R LE strength >/=4/5.  Baseline: See above Goal status: IN PROGRESS  Patient to demonstrate R ankle DF AROM to at least 5 degrees to improve efficiency of gait.  Baseline: -2 deg Goal status: IN PROGRESS  Demo improved gait speed and  endurance by ambulating distance of 1,200 ft during Borg RPE not exceeding 13/20 Baseline: 900 ft, no AD Goal status: IN PROGRESS  Patient to demonstrate gait speed of 2.7 ft/sec with LRAD in order to improve access to community.   Baseline: 2.1 ft/sec Goal status: IN PROGRESS  Patient to demonstrate 5xSTS test in <15 sec in order to decrease risk of falls.  Baseline: 19.05 sec Goal status: IN PROGRESS  Patient to score at least 46/56 on Berg in order to decrease risk of falls.  Baseline: 40 Goal status: IN PROGRESS    ASSESSMENT:  CLINICAL IMPRESSION: Patient arrived to session without AD, stating that he does not need one. He reports improvement since initial eval and unsure if should proceed with therapies. Worked on progressive functional strengthening and balance tasks. Admittedly, patient's balance and ability to perform STS transfers has improved quite a bit, however patient still demonstrates remaining limited safety awareness and insight into deficits, tendency for posterior LOB, and R LE discoordination/instability. Patient agreeable to proceed with PT for balance and reported understanding of edu provided today.    OBJECTIVE IMPAIRMENTS: Abnormal gait, decreased activity tolerance, decreased balance, decreased coordination, decreased endurance, difficulty walking, decreased ROM, decreased strength, decreased safety awareness, impaired perceived functional ability, impaired flexibility, and postural dysfunction.   ACTIVITY LIMITATIONS: carrying, lifting, bending, standing, squatting, stairs, transfers, bathing, toileting, dressing, reach over head, hygiene/grooming, and locomotion level  PARTICIPATION LIMITATIONS: meal prep, cleaning, laundry, driving, shopping, community activity, occupation, yard work, and church  PERSONAL FACTORS: Age, Past/current experiences, Profession, Time since onset of injury/illness/exacerbation, and 3+ comorbidities: ADD, HLD, HTN, L3/4  laminectomy 2019 are also affecting patient's functional outcome.   REHAB POTENTIAL: Good  CLINICAL DECISION MAKING: Evolving/moderate complexity  EVALUATION COMPLEXITY: Moderate  PLAN:  PT FREQUENCY: 2x/week  PT DURATION: 8 weeks  PLANNED INTERVENTIONS: [id number]- PT Re-evaluation, 97750- Physical Performance Testing, 97110-Therapeutic exercises, 97530- Therapeutic activity, [id number]- Neuromuscular re-education, 97535- Self Care, [id number]- Manual therapy, [id number]- Gait training, [phone]- Orthotic Initial, [phone]- Orthotic/Prosthetic subsequent, [phone]- Canalith repositioning, [phone]- Aquatic Therapy, Patient/Family education, Balance training, Stair training, Taping, Vestibular training, DME instructions, Cryotherapy, and Moist heat  PLAN FOR NEXT SESSION: [place] and progress HEP for R LE strengthening, STS, narrow BOS and dynamic balance     Louana Terrilyn Christians, PT, DPT 05/29/24 1:13 PM  [facility] [address], [address] [place], [place] [address] Phone # [phone] Fax # [phone]

## 2024-05-29 NOTE — Telephone Encounter (Signed)
 Left a message for the patient to return my call.

## 2024-05-30 ENCOUNTER — Encounter: Payer: Self-pay | Admitting: Family Medicine

## 2024-05-30 ENCOUNTER — Ambulatory Visit: Payer: Self-pay

## 2024-05-30 ENCOUNTER — Ambulatory Visit (INDEPENDENT_AMBULATORY_CARE_PROVIDER_SITE_OTHER): Admitting: Family Medicine

## 2024-05-30 ENCOUNTER — Ambulatory Visit: Admitting: Physical Therapy

## 2024-05-30 ENCOUNTER — Telehealth: Payer: Self-pay

## 2024-05-30 VITALS — BP 124/56 | HR 89 | Temp 98.4°F | Wt 203.8 lb

## 2024-05-30 DIAGNOSIS — T783XXA Angioneurotic edema, initial encounter: Secondary | ICD-10-CM | POA: Diagnosis not present

## 2024-05-30 MED ORDER — EPINEPHRINE 0.3 MG/0.3ML IJ SOAJ: 0.3000 mg | INTRAMUSCULAR | 0 refills | Status: DC | PRN

## 2024-05-30 MED ORDER — METHYLPREDNISOLONE ACETATE 40 MG/ML IJ SUSP
40.0000 mg | Freq: Once | INTRAMUSCULAR | Status: AC
  Administered 2024-05-30: 40 mg via INTRAMUSCULAR

## 2024-05-30 NOTE — Telephone Encounter (Signed)
 Patient informed of the message below and voiced understanding

## 2024-05-30 NOTE — Telephone Encounter (Signed)
 Please see previous note.

## 2024-05-30 NOTE — Telephone Encounter (Signed)
 FYI Only or Action Required?: Action required by provider: request for appointment.  Patient was last seen in primary care on 05/16/2024 by Micheal Wolm ORN, MD.  Called Nurse Triage reporting Allergic Reaction.  Symptoms began yesterday.  Interventions attempted: OTC medications: benadryl.  Symptoms are: unchanged.  Triage Disposition: See PCP When Office is Open (Within 3 Days)  Patient/caregiver understands and will follow disposition?: YesCopied from CRM [id number]. Topic: Clinical - Red Word Triage >> May 30, 2024  8:04 AM Suzen RAMAN wrote: Red Word that prompted transfer to Nurse Triage: Allergic reaction per patient possiblely due to medication prescribed from recent stroke. Reason for Disposition  [1] MILD face swelling (e.g., puffiness) AND [2] persists > 3 days  Answer Assessment - Initial Assessment Questions 1. ONSET: When did the swelling start? (e.g., minutes, hours, days)     Yesterday morning 2. LOCATION: What part of the face is swollen? (e.g., cheek, entire face, jaw joint area, under jaw)     Bottom lip 3. SEVERITY: How swollen is it?     Bottom lip 4. ITCHING: Is there any itching? If Yes, ask: How much?   (Scale 1-10; mild, moderate or severe)     Severe itching in groin, legs, left armpit, buttocks 5. PAIN: Is the swelling painful to touch? If Yes, ask: How painful is it?   (Scale 0-10; mild, moderate or severe)     denies 6. FEVER: Do you have a fever? If Yes, ask: What is it, how was it measured, and when did it start?      denies 7. CAUSE: What do you think is causing the face swelling?     Not sure 8. NEW MEDICINES: Have there been any new medicines started recently?     no 9. RECURRENT SYMPTOM: Have you had face swelling before? If Yes, ask: When was the last time? What happened that time?     no 10. OTHER SYMPTOMS: Do you have any other symptoms? (e.g., leg swelling, toothache)       Denies    Pt called  yesterday  with same symptoms of swollen bottom lip and itching. RN relayed office message to hold plavix  and take ASA. Pt was not aware. Pt states he has taken 2 more benadryl and still has swollen lip and itching over most of body. No breathing difficulty. Pt requested to see PCP today/ Pt appt is 1445 today.  Protocols used: Face Swelling-A-AH

## 2024-05-30 NOTE — Patient Instructions (Signed)
 Would be taking over the counter Xyzal, Zyrtec, or Allegra one day until swelling and itching subside  Continue with the Pepcid 20 mg twice daily.   HOLD Plavix  and Rosuvastatin  for now  Be in touch if symptoms not resolving by end of week.

## 2024-05-30 NOTE — Progress Notes (Signed)
 Established Patient Office Visit  Subjective   Patient ID: TAHEEM FRICKE, male    DOB: 07/16/1951  Age: 73 y.o. MRN: [id number]  Chief Complaint  Patient presents with   Medication Reaction   Fall   Pruritis    HPI   Hilman had recent CVA.  He was doing reasonably well but yesterday after waking up and taking his medicines developed some swelling especially of the lower lip and some diffuse pruritus.  He took Benadryl without much improvement.  He then tripped over a dog pillow in the floor and landed against a table striking right chest wall area.  Has some soreness now but no difficulty with breathing.  Denies any tongue swelling.  No shortness of breath.  No hand or foot swelling.  No visible hives.  Patient felt like the Plavix  which was recent new medication was likely culprit for her lip swelling and was instructed yesterday to hold that for now.  He also takes aspirin  81 mg daily, rosuvastatin  10 mg daily which is also another new medication, losartan  100 mg daily, amlodipine  10 mg daily, gabapentin , Uroxatrol.  His new medications include Plavix  and rosuvastatin .  He has been on losartan  for several years.  No prior history of angioedema.  Does not recall eating any red meat for several hours prior to episode yesterday morning including no red meat Sunday night.  No known food allergies.  Past Medical History:  Diagnosis Date   ADD 01/17/2010   Complication of anesthesia    takes awhile to wake up   History of back surgery 07/2018   Tumor removal   History of kidney stones    pt. 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/01/2010   OSTEOARTHRITIS, HAND 04/17/2010   TINNITUS 06/26/2009   TRIGGER FINGER 01/18/2009   Past Surgical History:  Procedure Laterality Date   APPENDECTOMY  1969   COLONOSCOPY     LAMINECTOMY N/A 07/12/2018   Procedure: Lumbar three-four Laminectomy for resection of intradural tumor;   Surgeon: Colon Shove, MD;  Location: [facility] OR;  Service: Neurosurgery;  Laterality: N/A;   VASECTOMY  1992    reports that he has never smoked. He has never used smokeless tobacco. He reports current alcohol use of about 1.0 - 4.0 standard drink of alcohol per week. He reports that he does not use drugs. family history includes Arthritis in an other family member; Bladder Cancer in his father; Hyperlipidemia in an other family member; Hypertension in an other family member; Stroke in his mother and sister; Sudden death in an other family member. Allergies  Allergen Reactions   Tolectin [Tolmetin Sodium] Other (See Comments)    Fever 104, breathing problems   Meloxicam Other (See Comments)    REACTION: Flushed, high temp   Univasc [Moexipril Hydrochloride] Cough    coughing    Review of Systems  Constitutional:  Negative for chills and fever.  Respiratory:  Negative for cough, shortness of breath and wheezing.   Cardiovascular:  Negative for chest pain.  Gastrointestinal:  Negative for nausea and vomiting.  Skin:  Positive for itching. Negative for rash.      Objective:     BP (!) 124/56 (BP Location: Left Arm, Patient Position: Sitting, Cuff Size: Normal)   Pulse 89   Temp 98.4 F (36.9 C) (Oral)   Wt 203 lb 12.8 oz (92.4 kg)   SpO2 95%   BMI  30.99 kg/m    Physical Exam Vitals reviewed.  Constitutional:      General: He is not in acute distress.    Appearance: He is not ill-appearing.  HENT:     Mouth/Throat:     Comments: He has some obvious swelling of his lower lip with mild swelling upper lip as well.  Tongue appears normal.  No edema posterior pharynx Cardiovascular:     Rate and Rhythm: Normal rate and regular rhythm.  Pulmonary:     Effort: Pulmonary effort is normal.     Breath sounds: Normal breath sounds.  Musculoskeletal:     Comments: No evidence for edema involving hands or feet  Skin:    Comments: No skin rashes.  No visible hives.  Neurological:      Mental Status: He is alert.      No results found for any visits on 05/30/24.    The ASCVD Risk score (Arnett DK, et al., 2019) failed to calculate for the following reasons:   Risk score cannot be calculated because patient has a medical history suggesting prior/existing ASCVD    Assessment & Plan:   Problem List Items Addressed This Visit   None Visit Diagnoses       Angioedema, initial encounter    -  Primary   Relevant Medications   methylPREDNISolone  acetate (DEPO-MEDROL ) injection 40 mg (Completed)   methylPREDNISolone  acetate (DEPO-MEDROL ) injection 40 mg (Completed)     Patient presents with angioedema type reaction with swelling predominantly lower lip after ingesting medications yesterday morning.  He has some diffuse pruritus but no visible hives.  New medications include Plavix  and rosuvastatin .  We suggested the following  -Hold Plavix  and rosuvastatin  for now - Continue aspirin  81 mg daily in setting of recent stroke - EpiPen  prescription given - Recommend over-the-counter antihistamine such as Zyrtec or Xyzal daily - Depo-Medrol  80 mg IM given - Reassess this Friday and sooner as needed.  We may need to continue to eliminate medications until symptoms resolving.  Also consider further lab testing possibly with alpha gal antibodies for any recurrent episodes  Return in about 3 days (around 06/02/2024).    Wolm Scarlet, MD

## 2024-05-30 NOTE — Telephone Encounter (Signed)
 Copied from CRM [id number]. Topic: General - Call Back - No Documentation >> May 29, 2024  6:00 PM Shereese L wrote: Reason for CRM: patient called in to return call and was advised that the office will call back upon reopening tomorrow

## 2024-05-31 ENCOUNTER — Other Ambulatory Visit (HOSPITAL_COMMUNITY): Payer: Self-pay

## 2024-05-31 NOTE — Therapy (Signed)
 OUTPATIENT PHYSICAL THERAPY NEURO TREATMENT   Patient Name: Jon Spencer MRN: [id number] DOB:11-22-1950, 73 y.o., male Today's Date: 06/01/2024   PCP:    Micheal Wolm ORN, MD   REFERRING PROVIDER:   [name] LABOR, NP    END OF SESSION:  PT End of Session - 06/01/24 1519     Visit Number 4    Number of Visits 17    Date for PT Re-Evaluation 07/12/24    Authorization Type [employer]    PT Start Time 1440    PT Stop Time 1520    PT Time Calculation (min) 40 min    Equipment Utilized During Treatment --    Activity Tolerance Patient tolerated treatment well    Behavior During Therapy [facility] for tasks assessed/performed            Past Medical History:  Diagnosis Date   ADD 01/17/2010   Complication of anesthesia    takes awhile to wake up   History of back surgery 07/2018   Tumor removal   History of kidney stones    pt. 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/01/2010   OSTEOARTHRITIS, HAND 04/17/2010   TINNITUS 06/26/2009   TRIGGER FINGER 01/18/2009   Past Surgical History:  Procedure Laterality Date   APPENDECTOMY  1969   COLONOSCOPY     LAMINECTOMY N/A 07/12/2018   Procedure: Lumbar three-four Laminectomy for resection of intradural tumor;  Surgeon: Colon Shove, MD;  Location: [facility] OR;  Service: Neurosurgery;  Laterality: N/A;   VASECTOMY  1992   Patient Active Problem List   Diagnosis Date Noted   Dysphagia 05/14/2024   CKD (chronic kidney disease) 05/14/2024   Obesity 05/14/2024   Stroke determined by clinical assessment (HCC) 05/12/2024   Acute ischemic stroke (HCC) 05/12/2024   Aortic valve stenosis 05/05/2023   SOB (shortness of breath) 05/05/2023   Ganglion cyst of flexor tendon sheath of finger of left hand 04/29/2021   De Quervain's tenosynovitis, right 04/25/2020   Schwannoma 07/12/2018   Hypogonadism male 04/10/2011   IMPOTENCE OF ORGANIC ORIGIN 10/01/2010    Osteoarthrosis, hand 04/17/2010   Attention deficit disorder 01/17/2010   Tinnitus 06/26/2009   Hyperlipidemia 01/18/2009   Essential hypertension 01/18/2009   Trigger finger, acquired 01/18/2009    ONSET DATE: 05/12/24  REFERRING DIAG: I63.9 (ICD-10-CM) - Acute CVA (cerebrovascular accident)  THERAPY DIAG:  Unsteadiness on feet  Muscle weakness (generalized)  Other abnormalities of gait and mobility  Other lack of coordination  Rationale for Evaluation and Treatment: Rehabilitation  SUBJECTIVE:  SUBJECTIVE STATEMENT: Reports that he had a fall- tripped over a pillow and landed on a table on the R side. Reports occasional pain when coughing or sneezing but denies SOB. Reports that he had an allergic reaction to one of his medications and is getting changes made to his meds. Also reports that the L shoulder started hurting yesterday and is getting progressively worse.    Pt accompanied by: self  PERTINENT HISTORY: ADD, HLD, HTN, L3/4 laminectomy 2019  PAIN:  Are you having pain? Yes: NPRS scale: 7/10 Pain location: L humerus  Pain description: achy-dull Aggravating factors: lifting overhead Relieving factors: none   PAIN:  Are you having pain? Yes: NPRS scale: 5/10 Pain location: R torso Pain description: achy-dull Aggravating factors: nothing Relieving factors: none  PRECAUTIONS: Fall  RED FLAGS: None   WEIGHT BEARING RESTRICTIONS: No  FALLS: Has patient fallen in last 6 months? 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 Bruise appears yellowed and healing. No bumps or deformity.      TTP over over L proximal biceps tendon and muscle belly. C/o pain with elbow and shoulder flexion    Vitals  143/75 mmHg, 97% spO2, 71bpm   Nustep L4 x 6 min LEs only  For LE strength   Sitting HS curl with blue TB 15x each  Slight ataxia   LAQ 15 5# each LE Leaning back d/t HS tightness; cues for pacing      PATIENT EDUCATION: Education details: discussed pt's difficulty adjusting to a major medical change, edu on anatomy/physiology of a stroke, review of HEP, edu on use ice on the L shoulder and suggested seeing Sports Medicine MD for this if it does not improve  Person educated: Patient Education method: Explanation Education comprehension: verbalized understanding   HOME EXERCISE PROGRAM Last updated: 05/25/24 Access Code: [facility] URL: [email or web address] Date: 05/25/2024 Prepared by: [facility] - Outpatient  Rehab - [facility]  Program Notes perform standing exercises near counter top for safety  Exercises - Corner Balance Feet Together With Eyes Open  - 1 x daily - 7 x weekly - 3 sets - 30 sec hold - Corner Balance Feet Together With Eyes Closed  - 1 x daily - 7 x weekly - 3 sets - 30 sec hold - Corner Balance Feet Together: Eyes Open With Head Turns  - 1 x daily - 7 x weekly - 3 sets - 30 sec hold - Corner Balance Feet Together: Eyes Closed With Head Turns  - 1 x daily - 7 x weekly - 3 sets - 30 sec hold - Alternating Step Forward with Support  - 1 x daily - 5 x weekly - 2 sets - 10 reps - Standing Toe Taps  - 1 x daily - 5 x weekly - 2 sets - 10 reps - Squat with Chair Touch  - 1 x daily - 5 x weekly - 2 sets - 10 reps     Note: Objective measures  were completed at Evaluation unless otherwise noted.  DIAGNOSTIC FINDINGS:  05/12/24 brain MRI: 15 mm acute infarct within the left aspect of the pons. 2. Small cortical/subcortical infarct within the posterior right frontal lobe, late subacute or chronic. 3. Chronic lacunar infarcts within the left corona radiata and within/about the bilateral basal ganglia.  COGNITION: Overall cognitive status: Within functional limits for tasks assessed   SENSATION: Reports episodic tingling sensation down his back and into the R LE when he stands from a nap.  COORDINATION: Alternating pronation/supination: slightly slowed on L UE Alternating toe tap: WNL B Finger to nose: WNL B   MUSCLE TONE: tight musculature in B LEs but does not seem neurologic in nature  POSTURE: rounded shoulders  LOWER EXTREMITY ROM:     Active  Right Eval Left Eval  Hip flexion    Hip extension    Hip abduction    Hip adduction    Hip internal rotation    Hip external rotation    Knee flexion    Knee extension    Ankle dorsiflexion -2 10  Ankle plantarflexion    Ankle inversion    Ankle eversion     (Blank rows = not tested)  LOWER EXTREMITY MMT:    MMT (in sitting) Right Eval Left Eval  Hip flexion 4 4+  Hip extension    Hip abduction 4- 4+  Hip adduction 4 4  Hip internal rotation    Hip external rotation    Knee flexion 4- 4  Knee extension 4+ 4+  Ankle dorsiflexion 2+ (d/t reduced ROM) 4+  Ankle plantarflexion 4+ 4+  Ankle inversion    Ankle eversion    (Blank rows = not tested)  GAIT: Findings: Assistive device utilized:hovering walker above the floor, Level of assistance: SBA, and Comments: antalgia and reduced stance time on R LE with lateral lean and mild instability   FUNCTIONAL TESTS:  5 times sit to stand: 19.05 sec with B armrests; unable to stand without UE support  10 meter walk test: 15.6 sec with pt hovering RW above the floor (2.1 ft/sec)   [facility] PT Assessment - 05/17/24  0001       Standardized Balance Assessment   Standardized Balance Assessment Berg Balance Test      Berg Balance Test   Sit to Stand Able to stand  independently using hands    Standing Unsupported Able to stand safely 2 minutes    Sitting with Back Unsupported but Feet Supported on Floor or Stool Able to sit safely and securely 2 minutes    Stand to Sit Sits safely with minimal use of hands    Transfers Able to transfer safely, definite need of hands    Standing Unsupported with Eyes Closed Able to stand 10 seconds safely    Standing Unsupported with Feet Together Able to place feet together independently and stand for 1 minute with supervision    From Standing, Reach Forward with Outstretched Arm Can reach forward >12 cm safely (5)    From Standing Position, Pick up Object from Floor Able to pick up shoe, needs supervision    From Standing Position, Turn to Look Behind Over each Shoulder Needs supervision when turning    Turn 360 Degrees Able to turn 360 degrees safely but slowly    Standing Unsupported, Alternately Place Feet on Step/Stool Able to complete 4 steps without aid or supervision    Standing Unsupported, One Foot in Front Able to plae foot ahead of the other independently and hold 30 seconds    Standing on  One Leg Tries to lift leg/unable to hold 3 seconds but remains standing independently    Total Score 40                                                                                                                                      TREATMENT DATE: 05/17/24    PATIENT EDUCATION: Education details: prognosis, POC, edu on exam findings; ed on falls risk according to balance testing and recommended proper use of RW Person educated: Patient Education method: Explanation, Tactile cues, and Verbal cues Education comprehension: verbalized understanding and returned demonstration  HOME EXERCISE PROGRAM: Not yet initiated   GOALS: Goals reviewed with patient?  Yes  SHORT TERM GOALS: Target date: 06/07/2024  Patient to be independent with initial HEP. Baseline: HEP initiated Goal status: MET    LONG TERM GOALS: Target date: 07/12/2024  Patient to be independent with advanced HEP. Baseline: Not yet initiated  Goal status: IN PROGRESS  Patient to demonstrate R LE strength >/=4/5.  Baseline: See above Goal status: IN PROGRESS  Patient to demonstrate R ankle DF AROM to at least 5 degrees to improve efficiency of gait.  Baseline: -2 deg Goal status: IN PROGRESS  Demo improved gait speed and endurance by ambulating distance of 1,200 ft during Borg RPE not exceeding 13/20 Baseline: 900 ft, no AD Goal status: IN PROGRESS  Patient to demonstrate gait speed of 2.7 ft/sec with LRAD in order to improve access to community.   Baseline: 2.1 ft/sec Goal status: IN PROGRESS  Patient to demonstrate 5xSTS test in <15 sec in order to decrease risk of falls.  Baseline: 19.05 sec Goal status: IN PROGRESS  Patient to score at least 46/56 on Berg in order to decrease risk of falls.  Baseline: 40 Goal status: IN PROGRESS    ASSESSMENT:  CLINICAL IMPRESSION: Patient arrived to session with report of a fall resulting in bruising over the R ribs and torso. Reports occasional pain when coughing or sneezing but denies SOB. Also reports new onset of L shoulder pain with overhead lifting- patient TTP over L biceps and painful with shoulder and elbow flexion. Session focused on patient education as patient reports difficulty adjusting after his stroke and dealing with new medical concerns. Patient tolerated progressive LE strengthening well. No complaints at end of session.    OBJECTIVE IMPAIRMENTS: Abnormal gait, decreased activity tolerance, decreased balance, decreased coordination, decreased endurance, difficulty walking, decreased ROM, decreased strength, decreased safety awareness, impaired perceived functional ability, impaired flexibility, and  postural dysfunction.   ACTIVITY LIMITATIONS: carrying, lifting, bending, standing, squatting, stairs, transfers, bathing, toileting, dressing, reach over head, hygiene/grooming, and locomotion level  PARTICIPATION LIMITATIONS: meal prep, cleaning, laundry, driving, shopping, community activity, occupation, yard work, and church  PERSONAL FACTORS: Age, Past/current experiences, Profession, Time since onset of injury/illness/exacerbation, and 3+ comorbidities: ADD, HLD, HTN, L3/4 laminectomy 2019 are also affecting patient's functional outcome.   REHAB  POTENTIAL: Good  CLINICAL DECISION MAKING: Evolving/moderate complexity  EVALUATION COMPLEXITY: Moderate  PLAN:  PT FREQUENCY: 2x/week  PT DURATION: 8 weeks  PLANNED INTERVENTIONS: 97164- PT Re-evaluation, 97750- Physical Performance Testing, 97110-Therapeutic exercises, 97530- Therapeutic activity, [id number]- Neuromuscular re-education, 97535- Self Care, [id number]- Manual therapy, [id number]- Gait training, [phone]- Orthotic Initial, [phone]- Orthotic/Prosthetic subsequent, [phone]- Canalith repositioning, [phone]- Aquatic Therapy, Patient/Family education, Balance training, Stair training, Taping, Vestibular training, DME instructions, Cryotherapy, and Moist heat  PLAN FOR NEXT SESSION: [place] and progress HEP for R LE strengthening, STS, narrow BOS and dynamic balance     Louana Terrilyn Christians, PT, DPT 06/01/24 3:27 PM  [facility] [address] [place], [place] [address] Phone # [phone] Fax # [phone]

## 2024-06-01 ENCOUNTER — Encounter: Payer: Self-pay | Admitting: Physical Therapy

## 2024-06-01 ENCOUNTER — Ambulatory Visit: Admitting: Physical Therapy

## 2024-06-01 DIAGNOSIS — M6281 Muscle weakness (generalized): Secondary | ICD-10-CM

## 2024-06-01 DIAGNOSIS — R2689 Other abnormalities of gait and mobility: Secondary | ICD-10-CM

## 2024-06-01 DIAGNOSIS — R278 Other lack of coordination: Secondary | ICD-10-CM

## 2024-06-01 DIAGNOSIS — I69353 Hemiplegia and hemiparesis following cerebral infarction affecting right non-dominant side: Secondary | ICD-10-CM | POA: Diagnosis not present

## 2024-06-01 DIAGNOSIS — R2681 Unsteadiness on feet: Secondary | ICD-10-CM

## 2024-06-02 ENCOUNTER — Emergency Department (HOSPITAL_BASED_OUTPATIENT_CLINIC_OR_DEPARTMENT_OTHER)

## 2024-06-02 ENCOUNTER — Other Ambulatory Visit: Payer: Self-pay

## 2024-06-02 ENCOUNTER — Encounter (HOSPITAL_BASED_OUTPATIENT_CLINIC_OR_DEPARTMENT_OTHER): Payer: Self-pay

## 2024-06-02 ENCOUNTER — Emergency Department (HOSPITAL_BASED_OUTPATIENT_CLINIC_OR_DEPARTMENT_OTHER)
Admission: EM | Admit: 2024-06-02 | Discharge: 2024-06-03 | Disposition: A | Discharge status: Home / Self Care | Attending: Emergency Medicine | Admitting: Emergency Medicine

## 2024-06-02 ENCOUNTER — Ambulatory Visit (INDEPENDENT_AMBULATORY_CARE_PROVIDER_SITE_OTHER): Admitting: Family Medicine

## 2024-06-02 ENCOUNTER — Encounter: Payer: Self-pay | Admitting: Family Medicine

## 2024-06-02 VITALS — BP 132/60 | HR 77 | Temp 98.2°F | Wt 204.8 lb

## 2024-06-02 DIAGNOSIS — M5412 Radiculopathy, cervical region: Secondary | ICD-10-CM

## 2024-06-02 DIAGNOSIS — Z7902 Long term (current) use of antithrombotics/antiplatelets: Secondary | ICD-10-CM | POA: Diagnosis not present

## 2024-06-02 DIAGNOSIS — Z79899 Other long term (current) drug therapy: Secondary | ICD-10-CM | POA: Insufficient documentation

## 2024-06-02 DIAGNOSIS — R9431 Abnormal electrocardiogram [ECG] [EKG]: Secondary | ICD-10-CM | POA: Diagnosis not present

## 2024-06-02 DIAGNOSIS — R200 Anesthesia of skin: Secondary | ICD-10-CM | POA: Diagnosis not present

## 2024-06-02 DIAGNOSIS — M79601 Pain in right arm: Secondary | ICD-10-CM | POA: Diagnosis not present

## 2024-06-02 DIAGNOSIS — I6523 Occlusion and stenosis of bilateral carotid arteries: Secondary | ICD-10-CM | POA: Diagnosis not present

## 2024-06-02 DIAGNOSIS — M79602 Pain in left arm: Secondary | ICD-10-CM | POA: Diagnosis not present

## 2024-06-02 DIAGNOSIS — M47812 Spondylosis without myelopathy or radiculopathy, cervical region: Secondary | ICD-10-CM | POA: Diagnosis not present

## 2024-06-02 DIAGNOSIS — I129 Hypertensive chronic kidney disease with stage 1 through stage 4 chronic kidney disease, or unspecified chronic kidney disease: Secondary | ICD-10-CM | POA: Insufficient documentation

## 2024-06-02 DIAGNOSIS — M79641 Pain in right hand: Secondary | ICD-10-CM | POA: Diagnosis not present

## 2024-06-02 DIAGNOSIS — N189 Chronic kidney disease, unspecified: Secondary | ICD-10-CM | POA: Diagnosis not present

## 2024-06-02 DIAGNOSIS — M79642 Pain in left hand: Secondary | ICD-10-CM | POA: Diagnosis not present

## 2024-06-02 DIAGNOSIS — R29898 Other symptoms and signs involving the musculoskeletal system: Secondary | ICD-10-CM | POA: Diagnosis not present

## 2024-06-02 DIAGNOSIS — T783XXD Angioneurotic edema, subsequent encounter: Secondary | ICD-10-CM | POA: Diagnosis not present

## 2024-06-02 DIAGNOSIS — M4187 Other forms of scoliosis, lumbosacral region: Secondary | ICD-10-CM | POA: Diagnosis not present

## 2024-06-02 DIAGNOSIS — Z7982 Long term (current) use of aspirin: Secondary | ICD-10-CM | POA: Diagnosis not present

## 2024-06-02 LAB — DIFFERENTIAL
Abs Immature Granulocytes: 0.04 K/uL (ref 0.00–0.07)
Basophils Absolute: 0 K/uL (ref 0.0–0.1)
Basophils Relative: 0 %
Eosinophils Absolute: 0.3 K/uL (ref 0.0–0.5)
Eosinophils Relative: 2 %
Immature Granulocytes: 0 %
Lymphocytes Relative: 14 %
Lymphs Abs: 1.9 K/uL (ref 0.7–4.0)
Monocytes Absolute: 0.9 K/uL (ref 0.1–1.0)
Monocytes Relative: 7 %
Neutro Abs: 10.4 K/uL — ABNORMAL HIGH (ref 1.7–7.7)
Neutrophils Relative %: 77 %

## 2024-06-02 LAB — CBC
HCT: 42 % (ref 39.0–52.0)
Hemoglobin: 13.6 g/dL (ref 13.0–17.0)
MCH: 28.9 pg (ref 26.0–34.0)
MCHC: 32.4 g/dL (ref 30.0–36.0)
MCV: 89.2 fL (ref 80.0–100.0)
Platelets: 248 K/uL (ref 150–400)
RBC: 4.71 MIL/uL (ref 4.22–5.81)
RDW: 14.5 % (ref 11.5–15.5)
WBC: 13.6 K/uL — ABNORMAL HIGH (ref 4.0–10.5)
nRBC: 0 % (ref 0.0–0.2)

## 2024-06-02 LAB — COMPREHENSIVE METABOLIC PANEL WITH GFR
ALT: 23 U/L (ref 0–44)
AST: 21 U/L (ref 15–41)
Albumin: 4.1 g/dL (ref 3.5–5.0)
Alkaline Phosphatase: 78 U/L (ref 38–126)
Anion gap: 14 (ref 5–15)
BUN: 25 mg/dL — ABNORMAL HIGH (ref 8–23)
CO2: 22 mmol/L (ref 22–32)
Calcium: 9.4 mg/dL (ref 8.9–10.3)
Chloride: 103 mmol/L (ref 98–111)
Creatinine, Ser: 1.47 mg/dL — ABNORMAL HIGH (ref 0.61–1.24)
GFR, Estimated: 50 mL/min — ABNORMAL LOW (ref >60)
Glucose, Bld: 138 mg/dL — ABNORMAL HIGH (ref 70–99)
Potassium: 4.2 mmol/L (ref 3.5–5.1)
Sodium: 139 mmol/L (ref 135–145)
Total Bilirubin: 0.4 mg/dL (ref 0.0–1.2)
Total Protein: 6.9 g/dL (ref 6.5–8.1)

## 2024-06-02 LAB — PROTIME-INR
INR: 1 (ref 0.8–1.2)
Prothrombin Time: 14.1 s (ref 11.4–15.2)

## 2024-06-02 LAB — CBG MONITORING, ED: Glucose-Capillary: 144 mg/dL — ABNORMAL HIGH (ref 70–99)

## 2024-06-02 MED ORDER — MORPHINE SULFATE (PF) 4 MG/ML IV SOLN
4.0000 mg | Freq: Once | INTRAVENOUS | Status: AC
  Administered 2024-06-02: 4 mg via INTRAVENOUS
  Filled 2024-06-02: qty 1

## 2024-06-02 MED ORDER — GABAPENTIN 100 MG PO CAPS
100.0000 mg | ORAL_CAPSULE | Freq: Once | ORAL | Status: AC
  Administered 2024-06-02: 100 mg via ORAL
  Filled 2024-06-02: qty 1

## 2024-06-02 MED ORDER — HYDROMORPHONE HCL 1 MG/ML IJ SOLN
1.0000 mg | Freq: Once | INTRAMUSCULAR | Status: AC
  Administered 2024-06-02: 1 mg via INTRAVENOUS
  Filled 2024-06-02: qty 1

## 2024-06-02 MED ORDER — IOHEXOL 350 MG/ML SOLN
75.0000 mL | Freq: Once | INTRAVENOUS | Status: AC | PRN
  Administered 2024-06-02: 75 mL via INTRAVENOUS

## 2024-06-02 MED ORDER — DEXAMETHASONE SODIUM PHOSPHATE 10 MG/ML IJ SOLN
10.0000 mg | Freq: Once | INTRAMUSCULAR | Status: AC
  Administered 2024-06-02: 10 mg via INTRAVENOUS
  Filled 2024-06-02: qty 1

## 2024-06-02 MED ORDER — ONDANSETRON HCL 4 MG/2ML IJ SOLN
4.0000 mg | Freq: Once | INTRAMUSCULAR | Status: AC
  Administered 2024-06-02: 4 mg via INTRAVENOUS
  Filled 2024-06-02: qty 2

## 2024-06-02 NOTE — ED Triage Notes (Signed)
 Pt reports bilateral arm pain and numbness starting yesterday around noon in only L arm/shoulder and then today having pain/numbness in R shoulder/arm. Pt recently dx w/ CVA x1 month ago. Pt has R sided facial droop from previous CVA. Pt report feeling in bilateral arms is equal.

## 2024-06-02 NOTE — Progress Notes (Signed)
 Established Patient Office Visit  Subjective   Patient ID: Jon Spencer, male    DOB: 24-Aug-1951  Age: 73 y.o. MRN: [id number]  Chief Complaint  Patient presents with   Medical Management of Chronic Issues    HPI   Arren had recent CVA.  Was seen here couple days ago with acute onset of lower lip edema.  This occurred after taking his Plavix  and he was concerned this was related to the Plavix .  No prior history of angioedema.  He has some diffuse itching but no hives.  No difficulty breathing.  We had him hold Plavix  and rosuvastatin  which are his new medications and he was given Depo-Medrol  and continued antihistamine.  Lip swelling went down by yesterday.  No itching at this time.  This point does remain on aspirin  and his other usual medications including amlodipine  and losartan .  No history of food allergies.  He is not scheduled to see neurologist until near the end of October.  He does have new issue today of acute onset yesterday left shoulder pain.  Radiates down toward the left middle finger.  No numbness.  No weakness.  No neck pain.  No chest pain.  Took some Advil with mild relief.  Denies any injury to the left shoulder though he did trip and fall against his right side recently.  Has had some chronic low back pain and takes gabapentin  though inconsistently  Past Medical History:  Diagnosis Date   ADD 01/17/2010   Complication of anesthesia    takes awhile to wake up   History of back surgery 07/2018   Tumor removal   History of kidney stones    pt. 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/01/2010   OSTEOARTHRITIS, HAND 04/17/2010   TINNITUS 06/26/2009   TRIGGER FINGER 01/18/2009   Past Surgical History:  Procedure Laterality Date   APPENDECTOMY  1969   COLONOSCOPY     LAMINECTOMY N/A 07/12/2018   Procedure: Lumbar three-four Laminectomy for resection of intradural tumor;  Surgeon: Colon Shove, MD;  Location: [facility] OR;  Service: Neurosurgery;  Laterality: N/A;   VASECTOMY  1992    reports that he has never smoked. He has never used smokeless tobacco. He reports current alcohol use of about 1.0 - 4.0 standard drink of alcohol per week. He reports that he does not use drugs. family history includes Arthritis in an other family member; Bladder Cancer in his father; Hyperlipidemia in an other family member; Hypertension in an other family member; Stroke in his mother and sister; Sudden death in an other family member. Allergies  Allergen Reactions   Tolectin [Tolmetin Sodium] Other (See Comments)    Fever 104, breathing problems   Meloxicam Other (See Comments)    REACTION: Flushed, high temp   Univasc [Moexipril Hydrochloride] Cough    coughing    Review of Systems  Constitutional:  Negative for malaise/fatigue.  Eyes:  Negative for blurred vision.  Respiratory:  Negative for shortness of breath.   Cardiovascular:  Negative for chest pain.  Musculoskeletal:  Negative for neck pain.  Skin:  Negative for itching and rash.  Neurological:  Negative for dizziness, weakness and headaches.      Objective:     BP 132/60 (BP Location: Left Arm, Cuff Size: Normal)   Pulse 77   Temp 98.2 F (36.8 C) (Oral)   Wt 204 lb 12.8 oz (92.9 kg)  SpO2 95%   BMI 31.14 kg/m  BP Readings from Last 3 Encounters:  06/02/24 132/60  05/30/24 (!) 124/56  05/16/24 138/68   Wt Readings from Last 3 Encounters:  06/02/24 204 lb 12.8 oz (92.9 kg)  05/30/24 203 lb 12.8 oz (92.4 kg)  05/16/24 201 lb 4.8 oz (91.3 kg)      Physical Exam Vitals reviewed.  Constitutional:      General: He is not in acute distress.    Appearance: He is not ill-appearing.  HENT:     Head:     Comments: Previously noted lower lip edema has resolved. Cardiovascular:     Rate and Rhythm: Normal rate and regular rhythm.  Pulmonary:     Effort: Pulmonary effort is normal.     Breath sounds: Normal breath  sounds.  Musculoskeletal:     Comments: Neck reveals good range of motion.  Neurological:     Mental Status: He is alert.     Comments: Full strength upper extremities with symmetric reflexes.  Good grip strength bilaterally.      No results found for any visits on 06/02/24.    The ASCVD Risk score (Arnett DK, et al., 2019) failed to calculate for the following reasons:   Risk score cannot be calculated because patient has a medical history suggesting prior/existing ASCVD    Assessment & Plan:   #1 recent angioedema type symptoms involving lower lip with some associated diffuse pruritus.  Etiology unclear but new medications recently were Plavix  and rosuvastatin .  He noticed symptoms shortly after taking his Plavix .  Recent CVA.  We had planned on dual antiplatelet therapy with aspirin  and Plavix  followed by aspirin .  Will consult with neurology to get their input.  Continue to hold Plavix  at this time.  His other new medication was rosuvastatin  and will consider trying to phase back in rosuvastatin  in a couple days but follow-up immediately for any recurrent lip edema, hives, or other concerns  #2 acute left upper extremity pain.  Radiates all the way down to the middle finger.  Sounds more neuropathic.  No associated weakness.  Consider getting back on gabapentin  300 mg once or twice daily.   avoid regular use of Advil.  Follow up for any progressive weakness, numbness, or pain.  Wolm Scarlet, MD

## 2024-06-02 NOTE — Patient Instructions (Signed)
 Start back the Rosuvastatin  in a couple of days  I will contact Dr Rosemarie (neurologist) about the Plavix .    Follow up immediately for any recurrent tongue swelling.

## 2024-06-02 NOTE — ED Provider Notes (Signed)
 [place] EMERGENCY DEPARTMENT AT [facility] Provider Note   CSN: [id number] Arrival date & time: 06/02/24  8060     Patient presents with: Numbness (/) and Arm Pain   Jon Spencer is a 73 y.o. male.   Patient is a 73 year old male with a history of CVA 3 weeks ago with known significant stenosis in the basilar artery who had been started on Plavix  but it was discontinued due to angioedema and concern for allergic reaction, hypertension, hyperlipidemia and chronic kidney disease who is presenting today with complaints of bilateral arm pain.  Patient reports the symptoms started yesterday and initially started on the left side going down the left arm which is still present but then today started going down his right arm and has become very severe in his right arm.  He describes it as a burning searing pain that goes down his arm and is worse when he tries to grip anything with his hands.  He denies any neck pain.  No pain when moving his neck.  He did have a fall last week where he fell up against something on the side of his ribs but denies hitting his head or his neck.  He has no symptoms in his legs and has been able to walk without any difficulty.  No difficulty swallowing or speaking.  He denies any headache or vision changes.  No chest pain or shortness of breath.  The history is provided by the patient.  Arm Pain       Prior to Admission medications   Medication Sig Start Date End Date Taking? Authorizing Provider  alfuzosin  (UROXATRAL ) 10 MG 24 hr tablet Take 10 mg by mouth daily. 10/09/23   [provider]  amLODipine  (NORVASC ) 10 MG tablet TAKE 1 TABLET BY MOUTH EVERY DAY 05/08/24   Burchette, Wolm ORN, MD  aspirin  EC 81 MG tablet Take 1 tablet (81 mg total) by mouth daily. Swallow whole. 05/15/24   [name] LABOR, NP  clopidogrel  (PLAVIX ) 75 MG tablet Take 1 tablet (75 mg total) by mouth daily. 05/15/24   [name] LABOR, NP  EPINEPHrine  (EPIPEN  2-PAK) 0.3  mg/0.3 mL IJ SOAJ injection Inject 0.3 mg into the muscle as needed for anaphylaxis. 05/30/24   Burchette, Wolm ORN, MD  gabapentin  (NEURONTIN ) 300 MG capsule TAKE 1 CAPSULE BY MOUTH TWICE A DAY Patient taking differently: Take 300 mg by mouth daily. 03/30/24   Burchette, Wolm ORN, MD  losartan  (COZAAR ) 100 MG tablet TAKE 1 TABLET BY MOUTH EVERY DAY 05/10/24   Burchette, Wolm ORN, MD  mirabegron  ER (MYRBETRIQ ) 50 MG TB24 tablet TAKE 1 TABLET BY MOUTH EVERY DAY 10/15/23   Burchette, Wolm ORN, MD  Multiple Vitamins-Minerals (MULTIVITAMIN ADULT) CHEW Chew 1 each by mouth daily.    [provider]  rosuvastatin  (CRESTOR ) 10 MG tablet Take 1 tablet (10 mg total) by mouth daily. 05/15/24   [name] LABOR, NP  sildenafil  (VIAGRA ) 100 MG tablet Take 1 tablet (100 mg total) by mouth daily as needed for erectile dysfunction. 04/14/24   Burchette, Wolm ORN, MD  testosterone  cypionate (DEPOTESTOSTERONE CYPIONATE) 200 MG/ML injection SMARTSIG:0.3 Milliliter(s) IM Twice a Week 10/28/23   [provider]    Allergies: Tolectin [tolmetin sodium], Meloxicam, and Univasc [moexipril hydrochloride]    Review of Systems  Updated Vital Signs BP (!) 161/77   Pulse 75   Temp 98.4 F (36.9 C) (Oral)   Resp 18   Ht 5' 9 (1.753 m)  Wt 90.7 kg   SpO2 97%   BMI 29.53 kg/m   Physical Exam Vitals and nursing note reviewed.  Constitutional:      General: He is not in acute distress.    Appearance: He is well-developed.  HENT:     Head: Normocephalic and atraumatic.  Eyes:     Conjunctiva/sclera: Conjunctivae normal.     Pupils: Pupils are equal, round, and reactive to light.  Cardiovascular:     Rate and Rhythm: Normal rate and regular rhythm.     Pulses: Normal pulses.     Heart sounds: No murmur heard. Pulmonary:     Effort: Pulmonary effort is normal. No respiratory distress.     Breath sounds: Normal breath sounds. No wheezing or rales.  Abdominal:     General: There is no distension.      Palpations: Abdomen is soft.     Tenderness: There is no abdominal tenderness. There is no guarding or rebound.  Musculoskeletal:        General: No tenderness. Normal range of motion.     Cervical back: Normal range of motion and neck supple. No tenderness. No spinous process tenderness or muscular tenderness.  Skin:    General: Skin is warm and dry.     Findings: No erythema or rash.  Neurological:     Mental Status: He is alert and oriented to person, place, and time.     Comments: No tenderness with palpation of the arms.  We can grip bilaterally.  Left hand strength greater than right.  5 out of 5 strength in bilateral lower extremities.  Sensation intact in all extremities.  No difficulty ambulating.  Speech is clear.  No pronator drift in all 4 extremities.  Bicep strength intact bilaterally and 5 out of 5  Psychiatric:        Behavior: Behavior normal.     (all labs ordered are listed, but only abnormal results are displayed) Labs Reviewed  CBC - Abnormal; Notable for the following components:      Result Value   WBC 13.6 (*)    All other components within normal limits  DIFFERENTIAL - Abnormal; Notable for the following components:   Neutro Abs 10.4 (*)    All other components within normal limits  COMPREHENSIVE METABOLIC PANEL WITH GFR - Abnormal; Notable for the following components:   Glucose, Bld 138 (*)    BUN 25 (*)    Creatinine, Ser 1.47 (*)    GFR, Estimated 50 (*)    All other components within normal limits  CBG MONITORING, ED - Abnormal; Notable for the following components:   Glucose-Capillary 144 (*)    All other components within normal limits  PROTIME-INR    EKG: None  Radiology: CT Angio Neck W and/or Wo Contrast Result Date: 06/02/2024 EXAM: CTA Neck 06/02/2024 10:05:04 PM TECHNIQUE: CT of the neck was performed with the administration of intravenous contrast. Multiplanar 2D and/or 3D reformatted images are provided for review. Automated exposure  control, iterative reconstruction, and/or weight based adjustment of the mA/kV was utilized to reduce the radiation dose to as low as reasonably achievable. Stenosis of the internal carotid arteries measured using NASCET criteria. COMPARISON: 05/12/2024 CLINICAL HISTORY: Now stenosis now with bilateral upper arm pain and weakness. Weakness and bilateral arm numbness and pain. Stenosis of arteries in neck seen on 05/12/24 CTA head and neck. FINDINGS: AORTIC ARCH AND ARCH VESSELS: No dissection or arterial injury. No significant stenosis of the brachiocephalic or subclavian  arteries. CERVICAL CAROTID ARTERIES: There is mixed density atherosclerotic disease at the right carotid bifurcation extending into the ICA with less than 50% stenosis. There is mixed density atherosclerosis of the proximal left ICA resulting in 50% stenosis. CERVICAL VERTEBRAL ARTERIES: No dissection, arterial injury, or significant stenosis. LUNGS AND MEDIASTINUM: Unremarkable. SOFT TISSUES: No acute abnormality. BONES: No acute abnormality. IMPRESSION: 1. Mixed density atherosclerotic disease at the right carotid bifurcation extending into the ICA with no hemodynamically significant stenosis. 2. Mixed density atherosclerosis of the proximal left ICA resulting in 50% stenosis. 3. Calcific atherosclerosis of both internal carotid arteries at the skull base with severe stenosis of the right supraclinoid ICA. Electronically signed by: Franky Stanford MD 06/02/2024 10:20 PM EDT RP Workstation: [id number]   CT Cervical Spine Wo Contrast Result Date: 06/02/2024 CLINICAL DATA:  Bilateral upper extremity pain and numbness since yesterday, prior stroke EXAM: CT CERVICAL SPINE WITHOUT CONTRAST TECHNIQUE: Multidetector CT imaging of the cervical spine was performed without intravenous contrast. Multiplanar CT image reconstructions were also generated. RADIATION DOSE REDUCTION: This exam was performed according to the departmental dose-optimization program  which includes automated exposure control, adjustment of the mA and/or kV according to patient size and/or use of iterative reconstruction technique. COMPARISON:  02/16/2024 FINDINGS: Alignment: Mild left convex scoliosis. Otherwise alignment is anatomic. Skull base and vertebrae: No acute fracture. No primary bone lesion or focal pathologic process. Soft tissues and spinal canal: No prevertebral fluid or swelling. No visible canal hematoma. Disc levels: Mild diffuse spondylosis greatest at the C5-6 level. Right predominant facet hypertrophy most pronounced from C3-4 through C5-6. Upper chest: Airway is patent. Visualized portions of the lung apices are clear. Other: Reconstructed images demonstrate no additional findings. IMPRESSION: 1. Multilevel cervical degenerative changes.  No acute fracture. Electronically Signed   By: Ozell Daring M.D.   On: 06/02/2024 20:16   CT HEAD WO CONTRAST Result Date: 06/02/2024 CLINICAL DATA:  Bilateral arm pain and numbness since yesterday, prior CVA EXAM: CT HEAD WITHOUT CONTRAST TECHNIQUE: Contiguous axial images were obtained from the base of the skull through the vertex without intravenous contrast. RADIATION DOSE REDUCTION: This exam was performed according to the departmental dose-optimization program which includes automated exposure control, adjustment of the mA and/or kV according to patient size and/or use of iterative reconstruction technique. COMPARISON:  05/13/2024 FINDINGS: Brain: Stable chronic small-vessel ischemic changes within the basal ganglia, external capsule, and bilateral periventricular white matter. No evidence of acute infarct or hemorrhage. The lateral ventricles and midline structures are otherwise unremarkable. No acute extra-axial fluid collections. No mass effect. Vascular: No hyperdense vessel or unexpected calcification. Stable atherosclerosis. Skull: Normal. Negative for fracture or focal lesion. Sinuses/Orbits: No acute finding. Other: None.  IMPRESSION: 1. Stable head CT, no acute intracranial process. Electronically Signed   By: Ozell Daring M.D.   On: 06/02/2024 20:14     Procedures   Medications Ordered in the ED  morphine  (PF) 4 MG/ML injection 4 mg (4 mg Intravenous Given 06/02/24 2207)  ondansetron  (ZOFRAN ) injection 4 mg (4 mg Intravenous Given 06/02/24 2206)  gabapentin  (NEURONTIN ) capsule 100 mg (100 mg Oral Given 06/02/24 2205)  iohexol  (OMNIPAQUE ) 350 MG/ML injection 75 mL (75 mLs Intravenous Contrast Given 06/02/24 2205)  dexamethasone  (DECADRON ) injection 10 mg (10 mg Intravenous Given 06/02/24 2316)  HYDROmorphone  (DILAUDID ) injection 1 mg (1 mg Intravenous Given 06/02/24 2317)  Medical Decision Making Amount and/or Complexity of Data Reviewed Labs: ordered. Decision-making details documented in ED Course. Radiology: ordered and independent interpretation performed. Decision-making details documented in ED Course.  Risk Prescription drug management.   Pt with multiple medical problems and comorbidities and presenting today with a complaint that caries a high risk for morbidity and mortality.  Here today with the above complaint.  Patient does have known basilar artery stenosis but is not complaining of any neck pain or headaches.  No stroke symptoms today.  And low suspicion for stroke but concern for possible cervical radiculopathy and bulging disc.  No recent trauma to the neck or concern for spinal fracture.  Lower suspicion for an epidural hematoma as patient is not anticoagulated other than aspirin  at this time.  He has no complaints in his legs.  I independently interpreted patient's labs and CBC with mild leukocytosis of 13 with a normal hemoglobin, CMP with stable creatinine of 1.47 and normal electrolytes.  I have independently visualized and interpreted pt's images today.  CT of the head is negative for acute findings and no evidence of bleed.  Cervical spine shows signs of  arthritis.  Radiology reports negative CT head and multilevel cervical degenerative changes without fracture.  CTA was read by radiology with mixed density atherosclerotic disease at the right carotid bifurcation extending into the ICA with no hemodynamically significant stenosis as well as 50% proximal left ICA stenosis and calcified atherosclerosis of both internal carotid arteries at the skull base with severe stenosis at the right Cora clinoid ICA.  No evidence of new clot or change from prior.  Suspect patient's symptoms are from cervical radiculopathy.  He was given pain medications.   11:42 PM Repeat evaluation patient still having significant pain.      Final diagnoses:  None    ED Discharge Orders     None          Doretha Folks, MD 06/02/24 2342

## 2024-06-03 MED ORDER — HYDROCODONE-ACETAMINOPHEN 5-325 MG PO TABS: 1.0000 | ORAL_TABLET | Freq: Four times a day (QID) | ORAL | 0 refills | Status: DC | PRN

## 2024-06-03 NOTE — Discharge Instructions (Signed)
 Begin taking hydrocodone  as prescribed as needed for pain.  Follow-up with your primary doctor if not improving in the next few days, and return to the ER if symptoms significantly worsen or change.

## 2024-06-05 ENCOUNTER — Ambulatory Visit: Attending: Nurse Practitioner | Admitting: Physical Therapy

## 2024-06-05 ENCOUNTER — Ambulatory Visit

## 2024-06-05 ENCOUNTER — Other Ambulatory Visit: Payer: Self-pay | Admitting: Family Medicine

## 2024-06-05 ENCOUNTER — Encounter: Payer: Self-pay | Admitting: Physical Therapy

## 2024-06-05 DIAGNOSIS — R2689 Other abnormalities of gait and mobility: Secondary | ICD-10-CM | POA: Diagnosis not present

## 2024-06-05 DIAGNOSIS — R471 Dysarthria and anarthria: Secondary | ICD-10-CM

## 2024-06-05 DIAGNOSIS — M6281 Muscle weakness (generalized): Secondary | ICD-10-CM | POA: Diagnosis not present

## 2024-06-05 DIAGNOSIS — R2681 Unsteadiness on feet: Secondary | ICD-10-CM | POA: Insufficient documentation

## 2024-06-05 DIAGNOSIS — R1312 Dysphagia, oropharyngeal phase: Secondary | ICD-10-CM

## 2024-06-05 DIAGNOSIS — I69353 Hemiplegia and hemiparesis following cerebral infarction affecting right non-dominant side: Secondary | ICD-10-CM | POA: Insufficient documentation

## 2024-06-05 NOTE — Therapy (Signed)
 OUTPATIENT SPEECH LANGUAGE PATHOLOGY TREATMENT   Patient Name: Jon Spencer MRN: [id number] DOB:Apr 10, 1951, 73 y.o., male Today's Date: 06/05/2024  PCP: [name], MD REFERRING PROVIDER: [name] LABOR, NP [name], B., MD - doc)  END OF SESSION:  End of Session - 06/05/24 2335     Visit Number 2    Number of Visits 17    Date for SLP Re-Evaluation 07/28/24   due to first ST 06/05/24   SLP Start Time 1405    SLP Stop Time  1445    SLP Time Calculation (min) 40 min    Activity Tolerance Patient tolerated treatment well           Past Medical History:  Diagnosis Date   ADD 01/17/2010   Complication of anesthesia    takes awhile to wake up   History of back surgery 07/2018   Tumor removal   History of kidney stones    pt. 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/01/2010   OSTEOARTHRITIS, HAND 04/17/2010   TINNITUS 06/26/2009   TRIGGER FINGER 01/18/2009   Past Surgical History:  Procedure Laterality Date   APPENDECTOMY  1969   COLONOSCOPY     LAMINECTOMY N/A 07/12/2018   Procedure: Lumbar three-four Laminectomy for resection of intradural tumor;  Surgeon: Colon Shove, MD;  Location: [facility] OR;  Service: Neurosurgery;  Laterality: N/A;   VASECTOMY  1992   Patient Active Problem List   Diagnosis Date Noted   Dysphagia 05/14/2024   CKD (chronic kidney disease) 05/14/2024   Obesity 05/14/2024   Stroke determined by clinical assessment (HCC) 05/12/2024   Acute ischemic stroke (HCC) 05/12/2024   Aortic valve stenosis 05/05/2023   SOB (shortness of breath) 05/05/2023   Ganglion cyst of flexor tendon sheath of finger of left hand 04/29/2021   De Quervain's tenosynovitis, right 04/25/2020   Schwannoma 07/12/2018   Hypogonadism male 04/10/2011   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  Pt accompanied by: self  PERTINENT HISTORY: ADD, HLD, HTN, L3/4 laminectomy 2019  PAIN:  Are you having pain? No  FALLS: Has patient fallen in last 6 months?  No  PATIENT GOALS: My voice is my product. Pt would like to see his speech improve.   OBJECTIVE:  Note: Objective measures were completed at Evaluation unless otherwise noted.  DIAGNOSTIC FINDINGS:  MRI - 05/12/24: IMPRESSION: 1. 15 mm acute infarct within the left aspect of the pons. 2. Small cortical/subcortical infarct within the posterior right frontal lobe, late subacute or chronic. 3. Chronic lacunar infarcts within the left corona radiata and within/about the bilateral basal ganglia. 4. Background moderate-to-advanced cerebral white matter chronic small vessel ischemic disease. 5. Mild generalized cerebral atrophy.    PATIENT REPORTED OUTCOME MEASURES (PROM): Communication Effectiveness Survey: provided in first 1-2 visits  TREATMENT DATE:   06/05/24: Not time for clinical swallow assessment today given pt's time constraints. To complete NEXT session. Pt also needs PROM. Pt appears more subdued today reporting that wife told him his voice sounds different in the late afternoons. SLP told pt to record 30 seconds of reading and then read the same passage in late afternoon and bring to therapy for SLP to compare.  Given pt's need to leave in 25 minutes, SLP  reviewed pt's HEP with him as he was not able to complete HEP largely due to allergic reaction to Plavix  (face swollen). He req'd usual min A with procedure, and model for air in cheeks and with overarticulation. He was told to complete HEP BID. SLP educated/re-educated pt about speech exaggeration and provided rationale.   05/29/24: SLP guided pt through a HEP for speech intelligibility and for oral motor strengthening.  PATIENT EDUCATION: Education details: results of eval, likely need for MBSS, rationale for MBSS, etiology of pt's dysarthria, stamina re: speech  Person educated: Patient Education method: Explanation, Demonstration, Verbal cues, and Handouts Education comprehension: verbalized understanding, returned demonstration, verbal cues required, and needs further education   GOALS: Goals reviewed with patient? Yes, generally  SHORT TERM GOALS: Target date: 06/30/24 (due to pt's first ST 06/05/24)  Pt will undergo clinical swallow assessment, and a MBSS if clinically indicated Baseline: Goal status: INITIAL  2.  Pt will complete HEP for dysarthria with mod I in 2 sessions Baseline:  Goal status: INITIAL  3.  Pt will complete HEP for oral motor strength with mod I in 2 sessions Baseline:  Goal status: INITIAL  4.  Pt will demo speech compensations in sentence responses 90% success in 2 sessions Baseline:  Goal status: INITIAL   LONG TERM GOALS: Target date: 07/28/24  Pt's PROM will improve from initial administration Baseline:  Goal status: INITIAL  2.  Pt will complete HEP for dysarthria with in 2 sessions Baseline:  Goal status: INITIAL  3.  Pt will complete HEP for oral motor strength in 2 sessions Baseline:  Goal status: INITIAL  4.  Pt will demo speech compensations, sufficient for pt satisfaction about his speech, in 10 minutes mod complex conversation, in 3 sessions Baseline:  Goal status: INITIAL   ASSESSMENT:  CLINICAL IMPRESSION: SLP made mistake on  pt's STG assessment date - changed to 06/30/24. Patient is a 73 y.o. male who was seen today for treatment of speech intelligibility after CVA May 12, 2024. 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 For additional information re: education see pt education above. Pt reports some additional coughing and labial leakage with meals since that date. Pt will undergo clinical swallow assessment next visit, with an MBSS scheduled if SLP deems clinically appropriate.   OBJECTIVE IMPAIRMENTS: include dysarthria and dysphagia. These impairments are limiting patient from return to work, household responsibilities, ADLs/IADLs, effectively communicating at home and in community, and safety when swallowing. Factors affecting potential to achieve goals and functional outcome are cooperation/participation level due to HEP completion necessary for improvement. Patient will benefit from skilled SLP services to address above impairments and improve overall function.  REHAB POTENTIAL: Good  PLAN:  SLP FREQUENCY: 1-2x/week  SLP DURATION: 8 weeks  PLANNED INTERVENTIONS: Aspiration precaution training, Pharyngeal strengthening exercises, Diet toleration management , Environmental controls, Trials of upgraded texture/liquids, Cueing hierachy, Internal/external aids, Oral motor exercises, Functional tasks, SLP instruction and feedback, Compensatory strategies, Patient/family education, [phone] Treatment of speech (30 or  45 min) , [id number] Treatment of swallowing function, and [id number] - evaluation of swallowing (clinical assessment).    Verdine Grenfell, CCC-SLP 06/05/2024, 11:35 PM

## 2024-06-05 NOTE — Therapy (Signed)
 OUTPATIENT PHYSICAL THERAPY NEURO TREATMENT   Patient Name: Jon Spencer MRN: [id number] DOB:1951/04/26, 73 y.o., male Today's Date: 06/05/2024   PCP:    Micheal Wolm ORN, MD   REFERRING PROVIDER:   [name] LABOR, NP    END OF SESSION:  PT End of Session - 06/05/24 1326     Visit Number 5    Number of Visits 17    Date for PT Re-Evaluation 07/12/24    Authorization Type [employer]    PT Start Time 1324    PT Stop Time 1402    PT Time Calculation (min) 38 min    Equipment Utilized During Treatment Gait belt    Activity Tolerance Patient tolerated treatment well    Behavior During Therapy WFL for tasks assessed/performed             Past Medical History:  Diagnosis Date   ADD 01/17/2010   Complication of anesthesia    takes awhile to wake up   History of back surgery 07/2018   Tumor removal   History of kidney stones    pt. 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/01/2010   OSTEOARTHRITIS, HAND 04/17/2010   TINNITUS 06/26/2009   TRIGGER FINGER 01/18/2009   Past Surgical History:  Procedure Laterality Date   APPENDECTOMY  1969   COLONOSCOPY     LAMINECTOMY N/A 07/12/2018   Procedure: Lumbar three-four Laminectomy for resection of intradural tumor;  Surgeon: Colon Shove, MD;  Location: [facility] OR;  Service: Neurosurgery;  Laterality: N/A;   VASECTOMY  1992   Patient Active Problem List   Diagnosis Date Noted   Dysphagia 05/14/2024   CKD (chronic kidney disease) 05/14/2024   Obesity 05/14/2024   Stroke determined by clinical assessment (HCC) 05/12/2024   Acute ischemic stroke (HCC) 05/12/2024   Aortic valve stenosis 05/05/2023   SOB (shortness of breath) 05/05/2023   Ganglion cyst of flexor tendon sheath of finger of left hand 04/29/2021   De Quervain's tenosynovitis, right 04/25/2020   Schwannoma 07/12/2018   Hypogonadism male 04/10/2011   IMPOTENCE OF ORGANIC ORIGIN 10/01/2010    Osteoarthrosis, hand 04/17/2010   Attention deficit disorder 01/17/2010   Tinnitus 06/26/2009   Hyperlipidemia 01/18/2009   Essential hypertension 01/18/2009   Trigger finger, acquired 01/18/2009    ONSET DATE: 05/12/24  REFERRING DIAG: I63.9 (ICD-10-CM) - Acute CVA (cerebrovascular accident)  THERAPY DIAG:  Unsteadiness on feet  Other abnormalities of gait and mobility  Muscle weakness (generalized)  Rationale for Evaluation and Treatment: Rehabilitation  SUBJECTIVE:  SUBJECTIVE STATEMENT: Feel more wobbly today-just not a great day.  Went to the ED and they thought the discs were aggravated-trying to hold off and avoid surgery as long as we can.  Went back to work today and I'm very fatigued.    Pt accompanied by: self  PERTINENT HISTORY: ADD, HLD, HTN, L3/4 laminectomy 2019  PAIN:  Are you having pain? No   PRECAUTIONS: Fall  RED FLAGS: None   WEIGHT BEARING RESTRICTIONS: No  FALLS: Has patient fallen in last 6 months? 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  PATIENT GOALS: return to work   OBJECTIVE:     TODAY'S TREATMENT: 06/05/2024 Activity Comments  Vitals:  149/78, HR 79   Forward/back walking  Forward/back walk EC at counter Tandem gait forward/back 1 minute each   Decreased R step length backwards  Standing hip extension, 10 reps R and L 4# RLE-cues for posture  Standing hip abduction, 10 reps R and L 4# RLE   Standing hamstring curls, R and L x 10 4# RLE  Reviewed alt step taps, 4# RLE   Stride stance sit to stand, 5 reps x 2 Cues for slowed control to sit  NuStep, Level 5>3, 4 minutes, BLEs only Pt fatigues RLE quickly  Vitals:  146/78, HR 83 bpm   Gait 50 ft x 4 reps with cues for increased RLE stance time/RLE step length and arm swing    HOME EXERCISE PROGRAM Access Code: [facility] URL: [email or web address] Date: 06/05/2024 Prepared by: [facility] - Outpatient  Rehab - [facility]  Program Notes perform standing exercises near counter top for safety  Exercises - Corner Balance Feet Together With Eyes Open  - 1 x daily - 7 x weekly - 3 sets - 30 sec hold - Corner Balance Feet Together With Eyes Closed  - 1 x daily - 7 x weekly - 3 sets - 30 sec hold - Corner Balance Feet Together: Eyes Open With Head Turns  - 1 x daily - 7 x weekly - 3 sets - 30 sec hold - Corner Balance Feet Together: Eyes Closed With Head Turns  - 1 x daily - 7 x weekly - 3 sets - 30 sec hold - Alternating Step Forward with Support  - 1 x daily - 5 x weekly - 2 sets - 10 reps - Standing Toe Taps  - 1 x daily - 5 x weekly - 2 sets - 10 reps - Squat with Chair Touch  - 1 x daily - 5 x weekly - 2 sets - 10 reps - Sit to stand in stride stance  - 1 x daily - 7 x weekly - 3 sets - 5 reps - Standing Hip Extension with Ankle Weight  - 1 x daily - 7 x weekly - 3 sets - 10 reps - Standing Hip Abduction with Ankle Weight  - 1 x daily - 7 x weekly - 3 sets - 10 reps   PATIENT EDUCATION: Education details: educated pt on fatigue associated with a CVA, especially in relation to pt's return to work; updated HEP Person educated: Patient Education method: Programmer, multimedia, Facilities manager, Verbal cues, and Handouts Education comprehension: verbalized understanding, returned demonstration,  and needs further education     ---------------------------------------------------------------------- Note: Objective measures  were completed at Evaluation unless otherwise noted.  DIAGNOSTIC FINDINGS: 05/12/24 brain MRI: 15 mm acute infarct within the left aspect of the pons. 2. Small cortical/subcortical infarct within the posterior right frontal lobe, late subacute or chronic. 3. Chronic lacunar infarcts within the left corona radiata and within/about the bilateral basal ganglia.  COGNITION: Overall cognitive status: Within functional limits for tasks assessed   SENSATION: Reports episodic tingling sensation down his back and into the R LE when he stands from a nap.  COORDINATION: Alternating pronation/supination: slightly slowed on L UE Alternating toe tap: WNL B Finger to nose: WNL B   MUSCLE TONE: tight musculature in B LEs but does not seem neurologic in nature  POSTURE: rounded shoulders  LOWER EXTREMITY ROM:     Active  Right Eval Left Eval  Hip flexion    Hip extension    Hip abduction    Hip adduction    Hip internal rotation    Hip external rotation    Knee flexion    Knee extension    Ankle dorsiflexion -2 10  Ankle plantarflexion    Ankle inversion    Ankle eversion     (Blank rows = not tested)  LOWER EXTREMITY MMT:    MMT (in sitting) Right Eval Left Eval  Hip flexion 4 4+  Hip extension    Hip abduction 4- 4+  Hip adduction 4 4  Hip internal rotation    Hip external rotation    Knee flexion 4- 4  Knee extension 4+ 4+  Ankle dorsiflexion 2+ (d/t reduced ROM) 4+  Ankle plantarflexion 4+ 4+  Ankle inversion    Ankle eversion    (Blank rows = not tested)  GAIT: Findings: Assistive device utilized:hovering walker above the floor, Level of assistance: SBA, and Comments: antalgia and reduced stance time on R LE with lateral lean and mild instability   FUNCTIONAL TESTS:  5 times sit to stand: 19.05 sec with B armrests; unable to stand without UE support  10 meter walk test: 15.6 sec with pt hovering RW above the floor (2.1 ft/sec)   [facility] PT Assessment - 05/17/24  0001       Standardized Balance Assessment   Standardized Balance Assessment Berg Balance Test      Berg Balance Test   Sit to Stand Able to stand  independently using hands    Standing Unsupported Able to stand safely 2 minutes    Sitting with Back Unsupported but Feet Supported on Floor or Stool Able to sit safely and securely 2 minutes    Stand to Sit Sits safely with minimal use of hands    Transfers Able to transfer safely, definite need of hands    Standing Unsupported with Eyes Closed Able to stand 10 seconds safely    Standing Unsupported with Feet Together Able to place feet together independently and stand for 1 minute with supervision    From Standing, Reach Forward with Outstretched Arm Can reach forward >12 cm safely (5)    From Standing Position, Pick up Object from Floor Able to pick up shoe, needs supervision    From Standing Position, Turn to Look Behind Over each Shoulder Needs supervision when turning    Turn 360 Degrees Able to turn 360 degrees safely but slowly    Standing Unsupported, Alternately Place Feet on Step/Stool Able to complete 4 steps without aid or supervision    Standing  Unsupported, One Foot in Front Able to plae foot ahead of the other independently and hold 30 seconds    Standing on One Leg Tries to lift leg/unable to hold 3 seconds but remains standing independently    Total Score 40                                                                                                                                      TREATMENT DATE: 05/17/24    PATIENT EDUCATION: Education details: prognosis, POC, edu on exam findings; ed on falls risk according to balance testing and recommended proper use of RW Person educated: Patient Education method: Explanation, Tactile cues, and Verbal cues Education comprehension: verbalized understanding and returned demonstration  HOME EXERCISE PROGRAM: Not yet initiated   GOALS: Goals reviewed with patient?  Yes  SHORT TERM GOALS: Target date: 06/07/2024  Patient to be independent with initial HEP. Baseline: HEP initiated Goal status: MET    LONG TERM GOALS: Target date: 07/12/2024  Patient to be independent with advanced HEP. Baseline: Not yet initiated  Goal status: IN PROGRESS  Patient to demonstrate R LE strength >/=4/5.  Baseline: See above Goal status: IN PROGRESS  Patient to demonstrate R ankle DF AROM to at least 5 degrees to improve efficiency of gait.  Baseline: -2 deg Goal status: IN PROGRESS  Demo improved gait speed and endurance by ambulating distance of 1,200 ft during Borg RPE not exceeding 13/20 Baseline: 900 ft, no AD Goal status: IN PROGRESS  Patient to demonstrate gait speed of 2.7 ft/sec with LRAD in order to improve access to community.   Baseline: 2.1 ft/sec Goal status: IN PROGRESS  Patient to demonstrate 5xSTS test in <15 sec in order to decrease risk of falls.  Baseline: 19.05 sec Goal status: IN PROGRESS  Patient to score at least 46/56 on Berg in order to decrease risk of falls.  Baseline: 40 Goal status: IN PROGRESS    ASSESSMENT:  CLINICAL IMPRESSION: Pt presents today with reports of fatigue and increased wobbliness with gait.  Vitals are [facility]; he does endorse starting back to work and today is the third day.  He is noted with balance activities to have decreased step length on RLE in posterior direction especially; worked to address glut strength and increased RLE step length and stance time.  RLE fatigues quickly with use of NuStep.  Educated pt on fatigue in relation to CVA.  Pt will continue to benefit from skilled PT towards goals for improved functional mobility and decreased fall risk.   OBJECTIVE IMPAIRMENTS: Abnormal gait, decreased activity tolerance, decreased balance, decreased coordination, decreased endurance, difficulty walking, decreased ROM, decreased strength, decreased safety awareness, impaired perceived functional  ability, impaired flexibility, and postural dysfunction.   ACTIVITY LIMITATIONS: carrying, lifting, bending, standing, squatting, stairs, transfers, bathing, toileting, dressing, reach over head, hygiene/grooming, and locomotion level  PARTICIPATION LIMITATIONS: meal prep, cleaning, laundry, driving, shopping, community  activity, occupation, yard work, and church  PERSONAL FACTORS: Age, Past/current experiences, Profession, Time since onset of injury/illness/exacerbation, and 3+ comorbidities: ADD, HLD, HTN, L3/4 laminectomy 2019 are also affecting patient's functional outcome.   REHAB POTENTIAL: Good  CLINICAL DECISION MAKING: Evolving/moderate complexity  EVALUATION COMPLEXITY: Moderate  PLAN:  PT FREQUENCY: 2x/week  PT DURATION: 8 weeks  PLANNED INTERVENTIONS: [id number]- PT Re-evaluation, 97750- Physical Performance Testing, 97110-Therapeutic exercises, 97530- Therapeutic activity, 97112- Neuromuscular re-education, 97535- Self Care, [id number]- Manual therapy, [phone]- Gait training, [phone]- Orthotic Initial, [phone]- Orthotic/Prosthetic subsequent, [phone]- Canalith repositioning, [phone]- Aquatic Therapy, Patient/Family education, Balance training, Stair training, Taping, Vestibular training, DME instructions, Cryotherapy, and Moist heat  PLAN FOR NEXT SESSION: Review and progress HEP for R LE strengthening, STS, narrow BOS and dynamic balance     Greig Anon, PT 06/05/24 2:07 PM Phone: [phone] Fax: [phone]  [facility] at [facility] [address], [address] [place], [place] [address] Phone # [phone] Fax # [phone]

## 2024-06-07 ENCOUNTER — Ambulatory Visit: Admitting: Physical Therapy

## 2024-06-07 ENCOUNTER — Encounter

## 2024-06-07 NOTE — Therapy (Incomplete)
 OUTPATIENT PHYSICAL THERAPY NEURO TREATMENT   Patient Name: Jon Spencer MRN: [id number] DOB:[birth year], 73 y.o., male Today's Date: 06/07/2024   PCP:    Micheal Wolm ORN, MD   REFERRING PROVIDER:   [name] LABOR, NP    END OF SESSION:       Past Medical History:  Diagnosis Date   ADD 01/17/2010   Complication of anesthesia    takes awhile to wake up   History of back surgery 07/2018   Tumor removal   History of kidney stones    pt. 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/01/2010   OSTEOARTHRITIS, HAND 04/17/2010   TINNITUS 06/26/2009   TRIGGER FINGER 01/18/2009   Past Surgical History:  Procedure Laterality Date   APPENDECTOMY  1969   COLONOSCOPY     LAMINECTOMY N/A 07/12/2018   Procedure: Lumbar three-four Laminectomy for resection of intradural tumor;  Surgeon: Colon Shove, MD;  [facility];  Service: Neurosurgery;  Laterality: N/A;   VASECTOMY  1992   Patient Active Problem List   Diagnosis Date Noted   Dysphagia 05/14/2024   CKD (chronic kidney disease) 05/14/2024   Obesity 05/14/2024   Stroke determined by clinical assessment (HCC) 05/12/2024   Acute ischemic stroke (HCC) 05/12/2024   Aortic valve stenosis 05/05/2023   SOB (shortness of breath) 05/05/2023   Ganglion cyst of flexor tendon sheath of finger of left hand 04/29/2021   De Quervain's tenosynovitis, right 04/25/2020   Schwannoma 07/12/2018   Hypogonadism male 04/10/2011   IMPOTENCE OF ORGANIC ORIGIN 10/01/2010   Osteoarthrosis, hand 04/17/2010   Attention deficit disorder 01/17/2010   Tinnitus 06/26/2009   Hyperlipidemia 01/18/2009   Essential hypertension 01/18/2009   Trigger finger, acquired 01/18/2009    ONSET DATE: 05/12/24  REFERRING DIAG: I63.9 (ICD-10-CM) - Acute CVA (cerebrovascular accident)  THERAPY DIAG:  No diagnosis found.  Rationale for Evaluation and Treatment:  Rehabilitation  SUBJECTIVE:                                                                                                                                                                                             SUBJECTIVE STATEMENT: ***Feel more wobbly today-just not a great day.  Went to the ED and they thought the discs were aggravated-trying to hold off and avoid surgery as long as we can.  Went back to work today and I'm very fatigued.    Pt accompanied by: self  PERTINENT HISTORY: ADD, HLD, HTN, L3/4 laminectomy 2019  PAIN:  Are you having pain? No   PRECAUTIONS:  Fall  RED FLAGS: None   WEIGHT BEARING RESTRICTIONS: No  FALLS: Has patient fallen in last 6 months? 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  PATIENT GOALS: return to work   OBJECTIVE:    TODAY'S TREATMENT: 06/07/2024 Activity Comments                       TODAY'S TREATMENT: 06/05/2024 Activity Comments  Vitals:  149/78, HR 79   Forward/back walking  Forward/back walk EC at counter Tandem gait forward/back 1 minute each   Decreased R step length backwards  Standing hip extension, 10 reps R and L 4# RLE-cues for posture  Standing hip abduction, 10 reps R and L 4# RLE  Standing hamstring curls, R and L x 10 4# RLE  Reviewed alt step taps, 4# RLE   Stride stance sit to stand, 5 reps x 2 Cues for slowed control to sit  NuStep, Level 5>3, 4 minutes, BLEs only Pt fatigues RLE quickly  Vitals:  146/78, HR 83 bpm   Gait 50 ft x 4 reps with cues for increased RLE stance time/RLE step length and arm swing    HOME EXERCISE PROGRAM Access Code:  [facility] URL: [email or web address] Date: 06/05/2024 Prepared by: [facility] - Outpatient  Rehab - [facility]  Program Notes perform standing exercises near counter top for safety  Exercises - Corner Balance Feet Together With Eyes Open  - 1 x daily - 7 x weekly - 3 sets - 30 sec hold - Corner Balance Feet Together With Eyes Closed  - 1 x daily - 7 x weekly - 3 sets - 30 sec hold - Corner Balance Feet Together: Eyes Open With Head Turns  - 1 x daily - 7 x weekly - 3 sets - 30 sec hold - Corner Balance Feet Together: Eyes Closed With Head Turns  - 1 x daily - 7 x weekly - 3 sets - 30 sec hold - Alternating Step Forward with Support  - 1 x daily - 5 x weekly - 2 sets - 10 reps - Standing Toe Taps  - 1 x daily - 5 x weekly - 2 sets - 10 reps - Squat with Chair Touch  - 1 x daily - 5 x weekly - 2 sets - 10 reps - Sit to stand in stride stance  - 1 x daily - 7 x weekly - 3 sets - 5 reps - Standing Hip Extension with Ankle Weight  - 1 x daily - 7 x weekly - 3 sets - 10 reps - Standing Hip Abduction with Ankle Weight  - 1 x daily - 7 x weekly - 3 sets - 10 reps   PATIENT EDUCATION: Education details: educated pt on fatigue associated with a CVA, especially in relation to pt's return to work; updated HEP Person educated: Patient Education method: Programmer, multimedia, Facilities manager, Verbal cues, and Handouts Education comprehension: verbalized understanding, returned demonstration, and needs further education     ---------------------------------------------------------------------- Note: Objective measures were completed at Evaluation unless otherwise noted.  DIAGNOSTIC FINDINGS: 05/12/24 brain MRI: 15 mm acute infarct within the left aspect of the pons. 2. Small cortical/subcortical infarct within the posterior right  frontal lobe, late subacute or chronic. 3. Chronic lacunar infarcts within the left corona radiata and within/about the bilateral basal  ganglia.  COGNITION: Overall cognitive status: Within functional limits for tasks assessed   SENSATION: Reports episodic tingling sensation down his back and into the R LE when he stands from a nap.  COORDINATION: Alternating pronation/supination: slightly slowed on L UE Alternating toe tap: WNL B Finger to nose: WNL B   MUSCLE TONE: tight musculature in B LEs but does not seem neurologic in nature  POSTURE: rounded shoulders  LOWER EXTREMITY ROM:     Active  Right Eval Left Eval  Hip flexion    Hip extension    Hip abduction    Hip adduction    Hip internal rotation    Hip external rotation    Knee flexion    Knee extension    Ankle dorsiflexion -2 10  Ankle plantarflexion    Ankle inversion    Ankle eversion     (Blank rows = not tested)  LOWER EXTREMITY MMT:    MMT (in sitting) Right Eval Left Eval  Hip flexion 4 4+  Hip extension    Hip abduction 4- 4+  Hip adduction 4 4  Hip internal rotation    Hip external rotation    Knee flexion 4- 4  Knee extension 4+ 4+  Ankle dorsiflexion 2+ (d/t reduced ROM) 4+  Ankle plantarflexion 4+ 4+  Ankle inversion    Ankle eversion    (Blank rows = not tested)  GAIT: Findings: Assistive device utilized:hovering walker above the floor, Level of assistance: SBA, and Comments: antalgia and reduced stance time on R LE with lateral lean and mild instability   FUNCTIONAL TESTS:  5 times sit to stand: 19.05 sec with B armrests; unable to stand without UE support  10 meter walk test: 15.6 sec with pt hovering RW above the floor (2.1 ft/sec)   [facility] PT Assessment - 05/17/24 0001       Standardized Balance Assessment   Standardized Balance Assessment Berg Balance Test      Berg Balance Test   Sit to Stand Able to stand  independently using hands    Standing Unsupported Able to stand safely 2 minutes    Sitting with Back Unsupported but Feet Supported on Floor or Stool Able to sit safely and securely 2 minutes    Stand  to Sit Sits safely with minimal use of hands    Transfers Able to transfer safely, definite need of hands    Standing Unsupported with Eyes Closed Able to stand 10 seconds safely    Standing Unsupported with Feet Together Able to place feet together independently and stand for 1 minute with supervision    From Standing, Reach Forward with Outstretched Arm Can reach forward >12 cm safely (5)    From Standing Position, Pick up Object from Floor Able to pick up shoe, needs supervision    From Standing Position, Turn to Look Behind Over each Shoulder Needs supervision when turning    Turn 360 Degrees Able to turn 360 degrees safely but slowly    Standing Unsupported, Alternately Place Feet on Step/Stool Able to complete 4 steps without aid or supervision    Standing Unsupported, One Foot in Front Able to plae foot ahead of the other independently and hold 30 seconds    Standing on One Leg Tries to lift leg/unable to hold 3 seconds but remains standing independently    Total Score 40  TREATMENT DATE: 05/17/24    PATIENT EDUCATION: Education details: prognosis, POC, edu on exam findings; ed on falls risk according to balance testing and recommended proper use of RW Person educated: Patient Education method: Explanation, Tactile cues, and Verbal cues Education comprehension: verbalized understanding and returned demonstration  HOME EXERCISE PROGRAM: Not yet initiated   GOALS: Goals reviewed with patient? Yes  SHORT TERM GOALS: Target date: 06/07/2024  Patient to be independent with initial HEP. Baseline: HEP initiated Goal status: MET    LONG TERM GOALS: Target date: 07/12/2024  Patient to be independent with advanced HEP. Baseline: Not yet initiated  Goal status: IN PROGRESS  Patient to demonstrate R LE strength >/=4/5.  Baseline: See above Goal  status: IN PROGRESS  Patient to demonstrate R ankle DF AROM to at least 5 degrees to improve efficiency of gait.  Baseline: -2 deg Goal status: IN PROGRESS  Demo improved gait speed and endurance by ambulating distance of 1,200 ft during Borg RPE not exceeding 13/20 Baseline: 900 ft, no AD Goal status: IN PROGRESS  Patient to demonstrate gait speed of 2.7 ft/sec with LRAD in order to improve access to community.   Baseline: 2.1 ft/sec Goal status: IN PROGRESS  Patient to demonstrate 5xSTS test in <15 sec in order to decrease risk of falls.  Baseline: 19.05 sec Goal status: IN PROGRESS  Patient to score at least 46/56 on Berg in order to decrease risk of falls.  Baseline: 40 Goal status: IN PROGRESS    ASSESSMENT:  CLINICAL IMPRESSION: Pt presents today ***. Skilled PT session focused on ***. Pt needs ***. Pt will continue to benefit from skilled PT towards goals for improved functional mobility and decreased fall risk.   Pt presents today with reports of fatigue and increased wobbliness with gait.  Vitals are [facility]; he does endorse starting back to work and today is the third day.  He is noted with balance activities to have decreased step length on RLE in posterior direction especially; worked to address glut strength and increased RLE step length and stance time.  RLE fatigues quickly with use of NuStep.  Educated pt on fatigue in relation to CVA.  Pt will continue to benefit from skilled PT towards goals for improved functional mobility and decreased fall risk.   OBJECTIVE IMPAIRMENTS: Abnormal gait, decreased activity tolerance, decreased balance, decreased coordination, decreased endurance, difficulty walking, decreased ROM, decreased strength, decreased safety awareness, impaired perceived functional ability, impaired flexibility, and postural dysfunction.   ACTIVITY LIMITATIONS: carrying, lifting, bending, standing, squatting, stairs, transfers, bathing, toileting,  dressing, reach over head, hygiene/grooming, and locomotion level  PARTICIPATION LIMITATIONS: meal prep, cleaning, laundry, driving, shopping, community activity, occupation, yard work, and church  PERSONAL FACTORS: Age, Past/current experiences, Profession, Time since onset of injury/illness/exacerbation, and 3+ comorbidities: ADD, HLD, HTN, L3/4 laminectomy 2019 are also affecting patient's functional outcome.   REHAB POTENTIAL: Good  CLINICAL DECISION MAKING: Evolving/moderate complexity  EVALUATION COMPLEXITY: Moderate  PLAN:  PT FREQUENCY: 2x/week  PT DURATION: 8 weeks  PLANNED INTERVENTIONS: [id number]- PT Re-evaluation, 97750- Physical Performance Testing, 97110-Therapeutic exercises, 97530- Therapeutic activity, [id number]- Neuromuscular re-education, 97535- Self Care, [id number]- Manual therapy, [id number]- Gait training, [phone]- Orthotic Initial, [phone]- Orthotic/Prosthetic subsequent, [phone]- Canalith repositioning, [phone]- Aquatic Therapy, Patient/Family education, Balance training, Stair training, Taping, Vestibular training, DME instructions, Cryotherapy, and Moist heat  PLAN FOR NEXT SESSION: ***Review and progress HEP for R LE strengthening, STS, narrow BOS and dynamic balance     Greig Anon, PT 06/07/24 8:14 AM Phone:  [phone] Fax: [phone]  [facility] at [facility] [address], [address] [place], [place] [address] Phone # [phone] Fax # [phone]

## 2024-06-08 ENCOUNTER — Telehealth: Payer: Self-pay

## 2024-06-08 NOTE — Telephone Encounter (Signed)
 Pt calling to discuss medication alternatives: routing to PCP office:    Copied from CRM [id number]. Topic: Clinical - Medication Question >> Jun 08, 2024  2:24 PM Jon Spencer wrote: Reason for CRM: Patient requesting a callback from provider to discuss medication alternatives for clopidogrel  (PLAVIX ) 75 MG tablet

## 2024-06-09 ENCOUNTER — Telehealth: Payer: Self-pay

## 2024-06-09 ENCOUNTER — Encounter: Payer: Self-pay | Admitting: Family Medicine

## 2024-06-09 NOTE — Telephone Encounter (Signed)
 Copied from CRM [id number]. Topic: Clinical - Medication Question >> Jun 09, 2024  3:09 PM [place] J wrote: Reason for CRM: Patient had a stroke 4 weeks ago and was administering testosterone  injections. He wanted to see if it would be okay to resume the injections. Patient does not want to schedule a follow-up visit at this time.  Patient is aware of the next business day turn around time and will be expecting a call back.

## 2024-06-09 NOTE — Telephone Encounter (Signed)
 Per PCP from Mychart encounter from 06/09/2024: There are cautions about resuming testosterone  within 6 months of CVA.  I would like to get opinion from neurologist before resuming testosterone  to make sure they do not have any concerns

## 2024-06-10 ENCOUNTER — Other Ambulatory Visit (HOSPITAL_COMMUNITY): Payer: Self-pay

## 2024-06-12 ENCOUNTER — Telehealth: Payer: Self-pay

## 2024-06-12 NOTE — Telephone Encounter (Signed)
 Patient informed of the results and voiced understanding. Message sent to Referral coordinator to review

## 2024-06-12 NOTE — Telephone Encounter (Signed)
 Received a voicemail from this patient advising that he can no longer take his Plavix  and that he is allergic to it. It looks like he was started on this in the hospital now aspirin  81 mg daily and clopidogrel  75 mg daily for 3 months and then Asprin alone.  I called patient back to get more information. He stated his face swelled up as a reaction to the medication. He started taking it around 7/12 and the swelling occurred about a week or two after starting the medication. Once stopping the medication, it took 3-4 days for the swelling to go down.  Has stopped the medication all together- he estimates his last dose would have been around July 21st. His PCP did prescribe an EPI pen for this - had not had to use it. Still carries it with him just in case.  If we can change his medication, he would like the new medication sent to   [id number] - [place], [place] - [address]

## 2024-06-13 ENCOUNTER — Other Ambulatory Visit: Payer: Self-pay | Admitting: Neurology

## 2024-06-13 DIAGNOSIS — I6300 Cerebral infarction due to thrombosis of unspecified precerebral artery: Secondary | ICD-10-CM

## 2024-06-13 MED ORDER — TICAGRELOR 60 MG PO TABS: 90.0000 mg | ORAL_TABLET | Freq: Two times a day (BID) | ORAL | Status: DC

## 2024-06-13 NOTE — Therapy (Incomplete)
 OUTPATIENT PHYSICAL THERAPY NEURO TREATMENT   Patient Name: Jon Spencer MRN: [id number] DOB:1951-01-28, 73 y.o., male Today's Date: 06/13/2024   PCP:    Micheal Wolm ORN, MD   REFERRING PROVIDER:   [name] LABOR, NP    END OF SESSION:       Past Medical History:  Diagnosis Date   ADD 01/17/2010   Complication of anesthesia    takes awhile to wake up   History of back surgery 07/2018   Tumor removal   History of kidney stones    pt. 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/01/2010   OSTEOARTHRITIS, HAND 04/17/2010   TINNITUS 06/26/2009   TRIGGER FINGER 01/18/2009   Past Surgical History:  Procedure Laterality Date   APPENDECTOMY  1969   COLONOSCOPY     LAMINECTOMY N/A 07/12/2018   Procedure: Lumbar three-four Laminectomy for resection of intradural tumor;  Surgeon: Colon Shove, MD;  Location: [facility] OR;  Service: Neurosurgery;  Laterality: N/A;   VASECTOMY  1992   Patient Active Problem List   Diagnosis Date Noted   Dysphagia 05/14/2024   CKD (chronic kidney disease) 05/14/2024   Obesity 05/14/2024   Stroke determined by clinical assessment (HCC) 05/12/2024   Acute ischemic stroke (HCC) 05/12/2024   Aortic valve stenosis 05/05/2023   SOB (shortness of breath) 05/05/2023   Ganglion cyst of flexor tendon sheath of finger of left hand 04/29/2021   De Quervain's tenosynovitis, right 04/25/2020   Schwannoma 07/12/2018   Hypogonadism male 04/10/2011   IMPOTENCE OF ORGANIC ORIGIN 10/01/2010   Osteoarthrosis, hand 04/17/2010   Attention deficit disorder 01/17/2010   Tinnitus 06/26/2009   Hyperlipidemia 01/18/2009   Essential hypertension 01/18/2009   Trigger finger, acquired 01/18/2009    ONSET DATE: 05/12/24  REFERRING DIAG: I63.9 (ICD-10-CM) - Acute CVA (cerebrovascular accident)  THERAPY DIAG:  No diagnosis found.  Rationale for Evaluation and Treatment:  Rehabilitation  SUBJECTIVE:                                                                                                                                                                                             SUBJECTIVE STATEMENT: Feel more wobbly today-just not a great day.  Went to the ED and they thought the discs were aggravated-trying to hold off and avoid surgery as long as we can.  Went back to work today and I'm very fatigued.    Pt accompanied by: self  PERTINENT HISTORY: ADD, HLD, HTN, L3/4 laminectomy 2019  PAIN:  Are you having pain? No   PRECAUTIONS:  Fall  RED FLAGS: None   WEIGHT BEARING RESTRICTIONS: No  FALLS: Has patient fallen in last 6 months? 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  PATIENT GOALS: return to work   OBJECTIVE:    TODAY'S TREATMENT: 06/14/24 Activity Comments                        HOME EXERCISE PROGRAM Access Code: [facility] URL: [email or web address] Date: 06/05/2024 Prepared by: [facility] - Outpatient  Rehab - [facility]  Program Notes perform standing exercises near counter top for safety  Exercises - Corner Balance Feet Together With Eyes Open  - 1 x daily - 7 x weekly - 3 sets - 30 sec hold - Corner Balance Feet Together With Eyes Closed  - 1 x daily - 7 x weekly - 3 sets - 30 sec hold - Corner Balance Feet Together: Eyes Open With Head Turns  - 1 x daily - 7 x weekly - 3 sets - 30 sec hold - Corner Balance Feet Together: Eyes Closed With Head Turns  - 1 x daily - 7 x weekly - 3 sets - 30 sec hold - Alternating Step Forward with Support  - 1 x  daily - 5 x weekly - 2 sets - 10 reps - Standing Toe Taps  - 1 x daily - 5 x weekly - 2 sets - 10 reps - Squat with Chair Touch  - 1 x daily - 5 x weekly - 2 sets - 10 reps - Sit to stand in stride stance  - 1 x daily - 7 x weekly - 3 sets - 5 reps - Standing Hip Extension with Ankle Weight  - 1 x daily - 7 x weekly - 3 sets - 10 reps - Standing Hip Abduction with Ankle Weight  - 1 x daily - 7 x weekly - 3 sets - 10 reps   PATIENT EDUCATION: Education details: educated pt on fatigue associated with a CVA, especially in relation to pt's return to work; updated HEP Person educated: Patient Education method: Programmer, multimedia, Facilities manager, Verbal cues, and Handouts Education comprehension: verbalized understanding, returned demonstration, and needs further education     ---------------------------------------------------------------------- Note: Objective measures were completed at Evaluation unless otherwise noted.  DIAGNOSTIC FINDINGS: 05/12/24 brain MRI: 15 mm acute infarct within the left aspect of the pons. 2. Small cortical/subcortical infarct within the posterior right frontal lobe, late subacute or chronic. 3. Chronic lacunar infarcts within the left corona radiata and within/about the bilateral basal ganglia.  COGNITION: Overall cognitive status: Within functional limits for tasks assessed   SENSATION: Reports episodic tingling sensation down his back and into the R LE when he stands from a nap.  COORDINATION: Alternating pronation/supination: slightly slowed on L UE Alternating toe tap: WNL B Finger to nose: WNL B   MUSCLE TONE: tight musculature in B LEs but does not seem neurologic in nature  POSTURE: rounded shoulders  LOWER EXTREMITY ROM:     Active  Right Eval Left Eval  Hip flexion    Hip extension    Hip abduction    Hip adduction    Hip internal rotation    Hip  external rotation    Knee flexion    Knee extension    Ankle dorsiflexion -2 10  Ankle  plantarflexion    Ankle inversion    Ankle eversion     (Blank rows = not tested)  LOWER EXTREMITY MMT:    MMT (in sitting) Right Eval Left Eval  Hip flexion 4 4+  Hip extension    Hip abduction 4- 4+  Hip adduction 4 4  Hip internal rotation    Hip external rotation    Knee flexion 4- 4  Knee extension 4+ 4+  Ankle dorsiflexion 2+ (d/t reduced ROM) 4+  Ankle plantarflexion 4+ 4+  Ankle inversion    Ankle eversion    (Blank rows = not tested)  GAIT: Findings: Assistive device utilized:hovering walker above the floor, Level of assistance: SBA, and Comments: antalgia and reduced stance time on R LE with lateral lean and mild instability   FUNCTIONAL TESTS:  5 times sit to stand: 19.05 sec with B armrests; unable to stand without UE support  10 meter walk test: 15.6 sec with pt hovering RW above the floor (2.1 ft/sec)   [facility] PT Assessment - 05/17/24 0001       Standardized Balance Assessment   Standardized Balance Assessment Berg Balance Test      Berg Balance Test   Sit to Stand Able to stand  independently using hands    Standing Unsupported Able to stand safely 2 minutes    Sitting with Back Unsupported but Feet Supported on Floor or Stool Able to sit safely and securely 2 minutes    Stand to Sit Sits safely with minimal use of hands    Transfers Able to transfer safely, definite need of hands    Standing Unsupported with Eyes Closed Able to stand 10 seconds safely    Standing Unsupported with Feet Together Able to place feet together independently and stand for 1 minute with supervision    From Standing, Reach Forward with Outstretched Arm Can reach forward >12 cm safely (5)    From Standing Position, Pick up Object from Floor Able to pick up shoe, needs supervision    From Standing Position, Turn to Look Behind Over each Shoulder Needs supervision when turning    Turn 360 Degrees Able to turn 360 degrees safely but slowly    Standing Unsupported, Alternately Place  Feet on Step/Stool Able to complete 4 steps without aid or supervision    Standing Unsupported, One Foot in Front Able to plae foot ahead of the other independently and hold 30 seconds    Standing on One Leg Tries to lift leg/unable to hold 3 seconds but remains standing independently    Total Score 40                                                                                                                                      TREATMENT DATE:  05/17/24    PATIENT EDUCATION: Education details: prognosis, POC, edu on exam findings; ed on falls risk according to balance testing and recommended proper use of RW Person educated: Patient Education method: Explanation, Tactile cues, and Verbal cues Education comprehension: verbalized understanding and returned demonstration  HOME EXERCISE PROGRAM: Not yet initiated   GOALS: Goals reviewed with patient? Yes  SHORT TERM GOALS: Target date: 06/07/2024  Patient to be independent with initial HEP. Baseline: HEP initiated Goal status: MET    LONG TERM GOALS: Target date: 07/12/2024  Patient to be independent with advanced HEP. Baseline: Not yet initiated  Goal status: IN PROGRESS  Patient to demonstrate R LE strength >/=4/5.  Baseline: See above Goal status: IN PROGRESS  Patient to demonstrate R ankle DF AROM to at least 5 degrees to improve efficiency of gait.  Baseline: -2 deg Goal status: IN PROGRESS  Demo improved gait speed and endurance by ambulating distance of 1,200 ft during Borg RPE not exceeding 13/20 Baseline: 900 ft, no AD Goal status: IN PROGRESS  Patient to demonstrate gait speed of 2.7 ft/sec with LRAD in order to improve access to community.   Baseline: 2.1 ft/sec Goal status: IN PROGRESS  Patient to demonstrate 5xSTS test in <15 sec in order to decrease risk of falls.  Baseline: 19.05 sec Goal status: IN PROGRESS  Patient to score at least 46/56 on Berg in order to decrease risk of falls.   Baseline: 40 Goal status: IN PROGRESS    ASSESSMENT:  CLINICAL IMPRESSION: Pt presents today with reports of fatigue and increased wobbliness with gait.  Vitals are [facility]; he does endorse starting back to work and today is the third day.  He is noted with balance activities to have decreased step length on RLE in posterior direction especially; worked to address glut strength and increased RLE step length and stance time.  RLE fatigues quickly with use of NuStep.  Educated pt on fatigue in relation to CVA.  Pt will continue to benefit from skilled PT towards goals for improved functional mobility and decreased fall risk.   OBJECTIVE IMPAIRMENTS: Abnormal gait, decreased activity tolerance, decreased balance, decreased coordination, decreased endurance, difficulty walking, decreased ROM, decreased strength, decreased safety awareness, impaired perceived functional ability, impaired flexibility, and postural dysfunction.   ACTIVITY LIMITATIONS: carrying, lifting, bending, standing, squatting, stairs, transfers, bathing, toileting, dressing, reach over head, hygiene/grooming, and locomotion level  PARTICIPATION LIMITATIONS: meal prep, cleaning, laundry, driving, shopping, community activity, occupation, yard work, and church  PERSONAL FACTORS: Age, Past/current experiences, Profession, Time since onset of injury/illness/exacerbation, and 3+ comorbidities: ADD, HLD, HTN, L3/4 laminectomy 2019 are also affecting patient's functional outcome.   REHAB POTENTIAL: Good  CLINICAL DECISION MAKING: Evolving/moderate complexity  EVALUATION COMPLEXITY: Moderate  PLAN:  PT FREQUENCY: 2x/week  PT DURATION: 8 weeks  PLANNED INTERVENTIONS: [id number]- PT Re-evaluation, 97750- Physical Performance Testing, 97110-Therapeutic exercises, 97530- Therapeutic activity, [id number]- Neuromuscular re-education, 97535- Self Care, [id number]- Manual therapy, [id number]- Gait training, [id number]- Orthotic Initial, [id number]- Orthotic/Prosthetic  subsequent, [phone]- Canalith repositioning, [phone]- Aquatic Therapy, Patient/Family education, Balance training, Stair training, Taping, Vestibular training, DME instructions, Cryotherapy, and Moist heat  PLAN FOR NEXT SESSION: Review and progress HEP for R LE strengthening, STS, narrow BOS and dynamic balance

## 2024-06-14 ENCOUNTER — Ambulatory Visit: Admitting: Physical Therapy

## 2024-06-14 ENCOUNTER — Ambulatory Visit

## 2024-06-14 MED ORDER — TICAGRELOR 90 MG PO TABS: 90.0000 mg | ORAL_TABLET | Freq: Two times a day (BID) | ORAL | 2 refills | Status: DC

## 2024-06-14 NOTE — Telephone Encounter (Signed)
 Spoke w/Pt to make him aware Dr. Rosemarie prescribed Brilinta  to use instead of Plavix  since he had an allergic reaction to it. Informed Pt to make us  aware if he has any issues with Brilinta . Pt voiced understanding and thanks for the call.

## 2024-06-16 ENCOUNTER — Encounter: Admitting: Occupational Therapy

## 2024-06-16 ENCOUNTER — Ambulatory Visit

## 2024-06-16 DIAGNOSIS — R2681 Unsteadiness on feet: Secondary | ICD-10-CM | POA: Diagnosis not present

## 2024-06-16 DIAGNOSIS — M6281 Muscle weakness (generalized): Secondary | ICD-10-CM

## 2024-06-16 DIAGNOSIS — R2689 Other abnormalities of gait and mobility: Secondary | ICD-10-CM

## 2024-06-16 DIAGNOSIS — I69353 Hemiplegia and hemiparesis following cerebral infarction affecting right non-dominant side: Secondary | ICD-10-CM

## 2024-06-16 NOTE — Therapy (Signed)
 OUTPATIENT PHYSICAL THERAPY NEURO TREATMENT and D/C Summary   Patient Name: Jon Spencer MRN: [id number] DOB:1951-09-08, 73 y.o., male Today's Date: 06/16/2024   PCP:    Micheal Wolm ORN, MD   REFERRING PROVIDER:   [name] LABOR, NP   PHYSICAL THERAPY DISCHARGE SUMMARY  Visits from Start of Care: 6  Current functional level related to goals / functional outcomes: See below for outcome measures   Remaining deficits: Notes some fatigue every other day by end of work day   Education / Equipment: HEP   Patient agrees to discharge. Patient goals were met. Patient is being discharged due to meeting the stated rehab goals.  END OF SESSION:  PT End of Session - 06/16/24 1104     Visit Number 6    Number of Visits 17    Date for PT Re-Evaluation 07/12/24    Authorization Type [employer]    PT Start Time 1103    PT Stop Time 1145    PT Time Calculation (min) 42 min    Equipment Utilized During Treatment Gait belt    Activity Tolerance Patient tolerated treatment well    Behavior During Therapy WFL for tasks assessed/performed             Past Medical History:  Diagnosis Date   ADD 01/17/2010   Complication of anesthesia    takes awhile to wake up   History of back surgery 07/2018   Tumor removal   History of kidney stones    pt. 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/01/2010   OSTEOARTHRITIS, HAND 04/17/2010   TINNITUS 06/26/2009   TRIGGER FINGER 01/18/2009   Past Surgical History:  Procedure Laterality Date   APPENDECTOMY  1969   COLONOSCOPY     LAMINECTOMY N/A 07/12/2018   Procedure: Lumbar three-four Laminectomy for resection of intradural tumor;  Surgeon: Colon Shove, MD;  Location: [facility] OR;  Service: Neurosurgery;  Laterality: N/A;   VASECTOMY  1992   Patient Active Problem List   Diagnosis Date Noted   Dysphagia 05/14/2024   CKD (chronic kidney disease) 05/14/2024    Obesity 05/14/2024   Stroke determined by clinical assessment (HCC) 05/12/2024   Acute ischemic stroke (HCC) 05/12/2024   Aortic valve stenosis 05/05/2023   SOB (shortness of breath) 05/05/2023   Ganglion cyst of flexor tendon sheath of finger of left hand 04/29/2021   De Quervain's tenosynovitis, right 04/25/2020   Schwannoma 07/12/2018   Hypogonadism male 04/10/2011   IMPOTENCE OF ORGANIC ORIGIN 10/01/2010   Osteoarthrosis, hand 04/17/2010   Attention deficit disorder 01/17/2010   Tinnitus 06/26/2009   Hyperlipidemia 01/18/2009   Essential hypertension 01/18/2009   Trigger finger, acquired 01/18/2009    ONSET DATE: 05/12/24  REFERRING DIAG: I63.9 (ICD-10-CM) - Acute CVA (cerebrovascular accident)  THERAPY DIAG:  Unsteadiness on feet  Other abnormalities of gait and mobility  Muscle weakness (generalized)  Hemiplegia and hemiparesis following cerebral infarction affecting right non-dominant side (HCC)  Rationale for Evaluation and Treatment: Rehabilitation  SUBJECTIVE:  SUBJECTIVE STATEMENT: Feel more wobbly today-just not a great day.  Went to the ED and they thought the discs were aggravated-trying to hold off and avoid surgery as long as we can.  Went back to work today and I'm very fatigued.    Pt accompanied by: self  PERTINENT HISTORY: ADD, HLD, HTN, L3/4 laminectomy 2019  PAIN:  Are you having pain? No   PRECAUTIONS: Fall  RED FLAGS: None   WEIGHT BEARING RESTRICTIONS: No  FALLS: Has patient fallen in last 6 months? 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  PATIENT GOALS: return to work   OBJECTIVE:   TODAY'S TREATMENT: 06/16/24 Activity Comments  POC performance and review   Vitals post , 1245 ft, no AD 154/74 mmHg, 74 bpm  Gait speed: 3.45 ft/sec               [facility] PT Assessment - 06/16/24 0001       Berg Balance Test   Sit to Stand Able to stand without using hands and stabilize independently    Standing Unsupported Able to stand safely 2 minutes    Sitting with Back Unsupported but Feet Supported on Floor or Stool Able to sit safely and securely 2 minutes    Stand to Sit Sits safely with minimal use of hands    Transfers Able to transfer safely, minor use of hands    Standing Unsupported with Eyes Closed Able to stand 10 seconds safely    Standing Unsupported with Feet Together Able to place feet together independently and stand 1 minute safely    From Standing, Reach Forward with Outstretched Arm Can reach confidently >25 cm (10)    From Standing Position, Pick up Object from Floor Able to pick up shoe safely and easily    From Standing Position, Turn to Look Behind Over each Shoulder Looks behind from both sides and weight shifts well    Turn 360 Degrees Able to turn 360 degrees safely but slowly    Standing Unsupported, Alternately Place Feet on Step/Stool Able to stand independently and safely and complete 8 steps in 20 seconds    Standing Unsupported, One Foot in Front Able to place foot tandem independently and hold 30 seconds    Standing on One Leg Able to lift leg independently and hold equal to or more than 3 seconds    Total Score 52            TODAY'S TREATMENT: 06/05/2024 Activity Comments  Vitals:  149/78, HR 79   Forward/back walking  Forward/back walk EC at counter Tandem gait forward/back 1 minute each   Decreased R step length backwards  Standing hip  extension, 10 reps R and L 4# RLE-cues for posture  Standing hip abduction, 10 reps R and L 4# RLE  Standing hamstring curls, R and L x 10 4# RLE  Reviewed alt step taps, 4# RLE   Stride stance sit to stand, 5 reps x 2 Cues for slowed control to sit  NuStep, Level 5>3, 4 minutes, BLEs only Pt fatigues RLE quickly  Vitals:  146/78, HR 83 bpm   Gait 50 ft x 4 reps with cues for  increased RLE stance time/RLE step length and arm swing    HOME EXERCISE PROGRAM Access Code: [employer] URL: [email or web address] Date: 06/05/2024 Prepared by: [facility] Outpatient  Rehab - [facility]  Program Notes perform standing exercises near counter top for safety  Exercises - Corner Balance Feet Together With Eyes Open  - 1 x daily - 7 x weekly - 3 sets - 30 sec hold - Corner Balance Feet Together With Eyes Closed  - 1 x daily - 7 x weekly - 3 sets - 30 sec hold - Corner Balance Feet Together: Eyes Open With Head Turns  - 1 x daily - 7 x weekly - 3 sets - 30 sec hold - Corner Balance Feet Together: Eyes Closed With Head Turns  - 1 x daily - 7 x weekly - 3 sets - 30 sec hold - Alternating Step Forward with Support  - 1 x daily - 5 x weekly - 2 sets - 10 reps - Standing Toe Taps  - 1 x daily - 5 x weekly - 2 sets - 10 reps - Squat with Chair Touch  - 1 x daily - 5 x weekly - 2 sets - 10 reps - Sit to stand in stride stance  - 1 x daily - 7 x weekly - 3 sets - 5 reps - Standing Hip Extension with Ankle Weight  - 1 x daily - 7 x weekly - 3 sets - 10 reps - Standing Hip Abduction with Ankle Weight  - 1 x daily - 7 x weekly - 3 sets - 10 reps   PATIENT EDUCATION: Education details: educated pt on fatigue associated with a CVA, especially in relation to pt's return to work; updated HEP Person educated: Patient Education method: Programmer, multimedia, Facilities manager, Verbal cues, and Handouts Education comprehension: verbalized understanding, returned demonstration, and needs further  education     ---------------------------------------------------------------------- Note: Objective measures were completed at Evaluation unless otherwise noted.  DIAGNOSTIC FINDINGS: 05/12/24 brain MRI: 15 mm acute infarct within the left aspect of the pons. 2. Small cortical/subcortical infarct within the posterior right frontal lobe, late subacute or chronic. 3. Chronic lacunar infarcts within the left corona radiata and within/about the bilateral basal ganglia.  COGNITION: Overall cognitive status: Within functional limits for tasks assessed   SENSATION: Reports episodic tingling sensation down his back and into the R LE when he stands from a nap.  COORDINATION: Alternating pronation/supination: slightly slowed on L UE Alternating toe tap: WNL B Finger to nose: WNL B   MUSCLE TONE: tight musculature in B LEs but does not seem neurologic in nature  POSTURE: rounded shoulders  LOWER EXTREMITY ROM:     Active  Right Eval Left Eval  Hip flexion    Hip extension    Hip abduction    Hip adduction    Hip internal rotation    Hip external rotation    Knee flexion    Knee extension    Ankle dorsiflexion -2 10  Ankle plantarflexion    Ankle inversion    Ankle eversion     (Blank rows = not tested)  LOWER EXTREMITY MMT:    MMT (in sitting) Right Eval Left Eval  Hip flexion 4 4+  Hip extension    Hip abduction 4- 4+  Hip adduction 4 4  Hip internal rotation    Hip external rotation    Knee flexion 4- 4  Knee extension 4+ 4+  Ankle dorsiflexion 2+ (d/t reduced ROM) 4+  Ankle plantarflexion  4+ 4+  Ankle inversion    Ankle eversion    (Blank rows = not tested)  GAIT: Findings: Assistive device utilized:hovering walker above the floor, Level of assistance: SBA, and Comments: antalgia and reduced stance time on R LE with lateral lean and mild instability   FUNCTIONAL TESTS:  5 times sit to stand: 19.05 sec with B armrests; unable to stand without UE support   10 meter walk test: 15.6 sec with pt hovering RW above the floor (2.1 ft/sec)   [facility] PT Assessment - 05/17/24 0001       Standardized Balance Assessment   Standardized Balance Assessment Berg Balance Test      Berg Balance Test   Sit to Stand Able to stand  independently using hands    Standing Unsupported Able to stand safely 2 minutes    Sitting with Back Unsupported but Feet Supported on Floor or Stool Able to sit safely and securely 2 minutes    Stand to Sit Sits safely with minimal use of hands    Transfers Able to transfer safely, definite need of hands    Standing Unsupported with Eyes Closed Able to stand 10 seconds safely    Standing Unsupported with Feet Together Able to place feet together independently and stand for 1 minute with supervision    From Standing, Reach Forward with Outstretched Arm Can reach forward >12 cm safely (5)    From Standing Position, Pick up Object from Floor Able to pick up shoe, needs supervision    From Standing Position, Turn to Look Behind Over each Shoulder Needs supervision when turning    Turn 360 Degrees Able to turn 360 degrees safely but slowly    Standing Unsupported, Alternately Place Feet on Step/Stool Able to complete 4 steps without aid or supervision    Standing Unsupported, One Foot in Front Able to plae foot ahead of the other independently and hold 30 seconds    Standing on One Leg Tries to lift leg/unable to hold 3 seconds but remains standing independently    Total Score 40                                                                                                                                      TREATMENT DATE: 05/17/24    PATIENT EDUCATION: Education details: prognosis, POC, edu on exam findings; ed on falls risk according to balance testing and recommended proper use of RW Person educated: Patient Education method: Explanation, Tactile cues, and Verbal cues Education comprehension: verbalized understanding and  returned demonstration  HOME EXERCISE PROGRAM: Not yet initiated   GOALS: Goals reviewed with patient? Yes  SHORT TERM GOALS: Target date: 06/07/2024  Patient to be independent with initial HEP. Baseline: HEP initiated Goal status: MET    LONG TERM GOALS: Target date: 07/12/2024  Patient to be independent with advanced HEP. Baseline: Not yet initiated  Goal status: MET  Patient to demonstrate R LE strength >/=4/5.  Baseline: See above Goal status: MET  Patient to demonstrate R ankle DF AROM to at least 5 degrees to improve efficiency of gait.  Baseline: -2 deg; 12 deg Goal status: MET  Demo improved gait speed and endurance by ambulating distance of 1,200 ft during Borg RPE not exceeding 13/20 Baseline: 900 ft, no AD; 1245 ft, 3/10 RPE Goal status: MET  Patient to demonstrate gait speed of 2.7 ft/sec with LRAD in order to improve access to community.   Baseline: 2.1 ft/sec; 3.45 ft/sec Goal status: MET  Patient to demonstrate 5xSTS test in <15 sec in order to decrease risk of falls.  Baseline: 19.05 sec; 14 sec Goal status: MET  Patient to score at least 46/56 on Berg in order to decrease risk of falls.  Baseline: 40; 52/56 Goal status: MET    ASSESSMENT:  CLINICAL IMPRESSION: Reports feeling improved without obvious residual deficits other than instances of fatigue every other day by end of work day.  Reports consistent performance with static balance activities and daily walking routine for exercise.  Exhibits low risk for falls per outcome measures with significantly improved score Berg Balance Test from baseline 40 to 52/56 indicating low risk for falls, <15 sec 5xSTS test indicating low risk for falls, and gait speed WNL per age/height.  reveals improved endurance and gait speed and low RPE with this sustained effort.  Pt with questions regarding stroke risk factors and discussed and provided list of modifiable vs non-modifiable risk factors and provided  contact info for Stroke Survivor support group for out county.  Verbalizes understanding and will D/C to self-mgmt at this time.   OBJECTIVE IMPAIRMENTS: Abnormal gait, decreased activity tolerance, decreased balance, decreased coordination, decreased endurance, difficulty walking, decreased ROM, decreased strength, decreased safety awareness, impaired perceived functional ability, impaired flexibility, and postural dysfunction.   ACTIVITY LIMITATIONS: carrying, lifting, bending, standing, squatting, stairs, transfers, bathing, toileting, dressing, reach over head, hygiene/grooming, and locomotion level  PARTICIPATION LIMITATIONS: meal prep, cleaning, laundry, driving, shopping, community activity, occupation, yard work, and church  PERSONAL FACTORS: Age, Past/current experiences, Profession, Time since onset of injury/illness/exacerbation, and 3+ comorbidities: ADD, HLD, HTN, L3/4 laminectomy 2019 are also affecting patient's functional outcome.   REHAB POTENTIAL: Good  CLINICAL DECISION MAKING: Evolving/moderate complexity  EVALUATION COMPLEXITY: Moderate  PLAN:  PT FREQUENCY: 2x/week  PT DURATION: 8 weeks  PLANNED INTERVENTIONS: [id number]- PT Re-evaluation, 97750- Physical Performance Testing, 97110-Therapeutic exercises, 97530- Therapeutic activity, [id number]- Neuromuscular re-education, 97535- Self Care, [id number]- Manual therapy, [id number]- Gait training, [phone]- Orthotic Initial, [id number]- Orthotic/Prosthetic subsequent, [phone]- Canalith repositioning, [phone]- Aquatic Therapy, Patient/Family education, Balance training, Stair training, Taping, Vestibular training, DME instructions, Cryotherapy, and Moist heat  PLAN FOR NEXT SESSION: D/C to HEP    11:54 AM, 06/16/24 M. Kelly Dastan Krider, PT, DPT Physical Therapist- [place] Office Number: [phone]

## 2024-06-19 ENCOUNTER — Other Ambulatory Visit (HOSPITAL_COMMUNITY): Payer: Self-pay

## 2024-06-20 ENCOUNTER — Ambulatory Visit: Admitting: Physical Therapy

## 2024-06-20 ENCOUNTER — Encounter: Admitting: Occupational Therapy

## 2024-06-22 ENCOUNTER — Ambulatory Visit

## 2024-06-22 ENCOUNTER — Encounter: Admitting: Occupational Therapy

## 2024-06-22 DIAGNOSIS — R2681 Unsteadiness on feet: Secondary | ICD-10-CM | POA: Diagnosis not present

## 2024-06-22 DIAGNOSIS — R471 Dysarthria and anarthria: Secondary | ICD-10-CM

## 2024-06-22 DIAGNOSIS — R1312 Dysphagia, oropharyngeal phase: Secondary | ICD-10-CM

## 2024-06-22 NOTE — Therapy (Signed)
 OUTPATIENT SPEECH LANGUAGE PATHOLOGY TREATMENT   Patient Name: Jon Spencer MRN: [id number] DOB:[birth year], 73 y.o., male Today's Date: 06/22/2024  PCP: [name], MD REFERRING PROVIDER: [name] LABOR, NP [name], B., MD - doc)  END OF SESSION:  End of Session - 06/22/24 1446     Visit Number 3    Number of Visits 17    Date for SLP Re-Evaluation 07/28/24   due to first ST 06/05/24   SLP Start Time 1448    Activity Tolerance Patient tolerated treatment well           Past Medical History:  Diagnosis Date   ADD 01/17/2010   Complication of anesthesia    takes awhile to wake up   History of back surgery 07/2018   Tumor removal   History of kidney stones    pt. 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/01/2010   OSTEOARTHRITIS, HAND 04/17/2010   TINNITUS 06/26/2009   TRIGGER FINGER 01/18/2009   Past Surgical History:  Procedure Laterality Date   APPENDECTOMY  1969   COLONOSCOPY     LAMINECTOMY N/A 07/12/2018   Procedure: Lumbar three-four Laminectomy for resection of intradural tumor;  Surgeon: Colon Shove, MD;  Location: [facility] OR;  Service: Neurosurgery;  Laterality: N/A;   VASECTOMY  1992   Patient Active Problem List   Diagnosis Date Noted   Dysphagia 05/14/2024   CKD (chronic kidney disease) 05/14/2024   Obesity 05/14/2024   Stroke determined by clinical assessment (HCC) 05/12/2024   Acute ischemic stroke (HCC) 05/12/2024   Aortic valve stenosis 05/05/2023   SOB (shortness of breath) 05/05/2023   Ganglion cyst of flexor tendon sheath of finger of left hand 04/29/2021   De Quervain's tenosynovitis, right 04/25/2020   Schwannoma 07/12/2018   Hypogonadism male 04/10/2011   IMPOTENCE OF ORGANIC ORIGIN 10/01/2010   Osteoarthrosis, hand 04/17/2010   Attention deficit disorder 01/17/2010   Tinnitus 06/26/2009   Hyperlipidemia 01/18/2009   Essential hypertension 01/18/2009   Trigger  finger, acquired 01/18/2009    ONSET DATE: 05/12/24   REFERRING DIAG: I63.9 (ICD-10-CM) - Acute CVA (cerebrovascular accident) (HCC)  THERAPY DIAG:  No diagnosis found.  Rationale for Evaluation and Treatment: Rehabilitation  SUBJECTIVE:   SUBJECTIVE STATEMENT: Yesterday I had a lot of conversations with employees in the morning, and a conversation with [an exec] yesterday afternoon and my speech disappointed me.  Pt accompanied by: self  PERTINENT HISTORY: ADD, HLD, HTN, L3/4 laminectomy 2019  PAIN:  Are you having pain? No  FALLS: Has patient fallen in last 6 months?  No  PATIENT GOALS: My voice is my product. Pt would like to see his speech improve.   OBJECTIVE:  Note: Objective measures were completed at Evaluation unless otherwise noted.  DIAGNOSTIC FINDINGS:  MRI - 05/12/24: IMPRESSION: 1. 15 mm acute infarct within the left aspect of the pons. 2. Small cortical/subcortical infarct within the posterior right frontal lobe, late subacute or chronic. 3. Chronic lacunar infarcts within the left corona radiata and within/about the bilateral basal ganglia. 4. Background moderate-to-advanced cerebral white matter chronic small vessel ischemic disease. 5. Mild generalized cerebral atrophy.    PATIENT REPORTED OUTCOME MEASURES (PROM): Communication Effectiveness Survey: to be provided on 07/01/24 and pt will return it next session.  TREATMENT DATE:   06/22/24: Needs PROM next session.  SPEECH: Given pt's s SLP engaged pt in 12 minutes conversation regarding education for energy conservation and how pt can obtain this during his workday as he feels like he has to be at the office. Pt to implement these strategies. SLP also educated pt he must complete HEP at least 5 days/week as he is now completing 2 days/week.  SWALLOW: Pt reports larger sips  still lead to coughing x3-4/day. SLP completed clinical swallow eval today and is recommending a MBSS. Lingual strength decr'd on lt, ROM WNL. Labial strength reduced on rt and pt reports with larger sips will have some anterior labial leakage. ROM WNL.Pt's cough appears weak; Throat clear appears WNL.  Will send over referral for MBSS per pt's therapy plan below.   06/05/24: Not time for clinical swallow assessment today given pt's time constraints. To complete NEXT session. Pt also needs PROM. Pt appears more subdued today reporting that wife told him his voice sounds different in the late afternoons. SLP told pt to record 30 seconds of reading and then read the same passage in late afternoon and bring to therapy for SLP to compare.  Given pt's need to leave in 25 minutes, SLP reviewed pt's HEP with him as he was not able to complete HEP largely due to allergic reaction to Plavix  (face swollen). He req'd usual min A with procedure, and model for air in cheeks and with overarticulation. He was told to complete HEP BID. SLP educated/re-educated pt about speech exaggeration and provided rationale.   05/29/24: SLP guided pt through a HEP for speech intelligibility and for oral motor strengthening.  PATIENT EDUCATION: Education details: results of eval, likely need for MBSS, rationale for MBSS, etiology of pt's dysarthria, stamina re: speech  Person educated: Patient Education method: Explanation, Demonstration, Verbal cues, and Handouts Education comprehension: verbalized understanding, returned demonstration, verbal cues required, and needs further education   GOALS: Goals reviewed with patient? Yes, generally  SHORT TERM GOALS: Target date: 06/30/24 (due to pt's first ST 06/05/24)  Pt will undergo clinical swallow assessment, and a MBSS if clinically indicated Baseline: Goal status: met  2.  Pt will complete HEP for dysarthria with mod I in 2 sessions Baseline:  Goal status: INITIAL  3.  Pt  will complete HEP for oral motor strength with mod I in 2 sessions Baseline:  Goal status: INITIAL  4.  Pt will demo speech compensations in sentence responses 90% success in 2 sessions Baseline:  Goal status: INITIAL   LONG TERM GOALS: Target date: 07/28/24  Pt's PROM will improve from initial administration Baseline:  Goal status: INITIAL  2.  Pt will complete HEP for dysarthria with in 2 sessions Baseline:  Goal status: INITIAL  3.  Pt will complete HEP for oral motor strength in 2 sessions Baseline:  Goal status: INITIAL  4.  Pt will demo speech compensations, sufficient for pt satisfaction about his speech, in 10 minutes mod complex conversation, in 3 sessions Baseline:  Goal status: INITIAL   ASSESSMENT:  CLINICAL IMPRESSION: Patient is a 73 y.o. male who was seen today for treatment of speech intelligibility and for swallowing after CVA May 12, 2024. 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 For additional information re: education see pt education above. Given that pt cont to report some additional coughing and labial leakage with meals a clinical swallow assessment was completed today with an MBSS recommended as  this is clinically appropriate at this time.   OBJECTIVE IMPAIRMENTS: include dysarthria and dysphagia. These impairments are limiting patient from return to work, household responsibilities, ADLs/IADLs, effectively communicating at home and in community, and safety when swallowing. Factors affecting potential to achieve goals and functional outcome are cooperation/participation level due to HEP completion necessary for improvement. Patient will benefit from skilled SLP services to address above impairments and improve overall function.  REHAB POTENTIAL: Good  PLAN:  SLP FREQUENCY: 1-2x/week  SLP DURATION: 8 weeks  PLANNED INTERVENTIONS: Aspiration precaution training, Pharyngeal strengthening exercises, Diet  toleration management , Environmental controls, Trials of upgraded texture/liquids, Cueing hierachy, Internal/external aids, Oral motor exercises, Functional tasks, SLP instruction and feedback, Compensatory strategies, Patient/family education, [phone] Treatment of speech (30 or 45 min) , 92526 Treatment of swallowing function, and 07389 - evaluation of swallowing (clinical assessment).    Dinia Joynt, CCC-SLP 06/22/2024, 2:46 PM

## 2024-06-23 ENCOUNTER — Other Ambulatory Visit (HOSPITAL_COMMUNITY): Payer: Self-pay | Admitting: Family Medicine

## 2024-06-23 DIAGNOSIS — R1310 Dysphagia, unspecified: Secondary | ICD-10-CM

## 2024-06-23 DIAGNOSIS — R059 Cough, unspecified: Secondary | ICD-10-CM

## 2024-06-28 ENCOUNTER — Encounter: Admitting: Occupational Therapy

## 2024-06-28 ENCOUNTER — Ambulatory Visit

## 2024-06-28 ENCOUNTER — Ambulatory Visit: Admitting: Physical Therapy

## 2024-06-28 DIAGNOSIS — M4712 Other spondylosis with myelopathy, cervical region: Secondary | ICD-10-CM | POA: Diagnosis not present

## 2024-06-28 DIAGNOSIS — Z6828 Body mass index (BMI) 28.0-28.9, adult: Secondary | ICD-10-CM | POA: Diagnosis not present

## 2024-06-30 ENCOUNTER — Ambulatory Visit

## 2024-06-30 ENCOUNTER — Encounter: Admitting: Occupational Therapy

## 2024-06-30 DIAGNOSIS — R2681 Unsteadiness on feet: Secondary | ICD-10-CM | POA: Diagnosis not present

## 2024-06-30 DIAGNOSIS — R471 Dysarthria and anarthria: Secondary | ICD-10-CM

## 2024-06-30 DIAGNOSIS — R1312 Dysphagia, oropharyngeal phase: Secondary | ICD-10-CM

## 2024-06-30 NOTE — Therapy (Signed)
 OUTPATIENT SPEECH LANGUAGE PATHOLOGY TREATMENT   Patient Name: Jon Spencer MRN: [id number] DOB:1950/12/01, 73 y.o., male Today's Date: 06/30/2024  PCP: [name], MD REFERRING PROVIDER: [name] LABOR, NP [name], B., MD - doc)  END OF SESSION:  End of Session - 06/30/24 1248     Visit Number 4    Number of Visits 17    Date for SLP Re-Evaluation 07/28/24    SLP Start Time 0935    SLP Stop Time  1010    SLP Time Calculation (min) 35 min    Activity Tolerance Patient tolerated treatment well            Past Medical History:  Diagnosis Date   ADD 01/17/2010   Complication of anesthesia    takes awhile to wake up   History of back surgery 07/2018   Tumor removal   History of kidney stones    pt. 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/01/2010   OSTEOARTHRITIS, HAND 04/17/2010   TINNITUS 06/26/2009   TRIGGER FINGER 01/18/2009   Past Surgical History:  Procedure Laterality Date   APPENDECTOMY  1969   COLONOSCOPY     LAMINECTOMY N/A 07/12/2018   Procedure: Lumbar three-four Laminectomy for resection of intradural tumor;  Surgeon: Colon Shove, MD;  Location: [facility] OR;  Service: Neurosurgery;  Laterality: N/A;   VASECTOMY  1992   Patient Active Problem List   Diagnosis Date Noted   Dysphagia 05/14/2024   CKD (chronic kidney disease) 05/14/2024   Obesity 05/14/2024   Stroke determined by clinical assessment (HCC) 05/12/2024   Acute ischemic stroke (HCC) 05/12/2024   Aortic valve stenosis 05/05/2023   SOB (shortness of breath) 05/05/2023   Ganglion cyst of flexor tendon sheath of finger of left hand 04/29/2021   De Quervain's tenosynovitis, right 04/25/2020   Schwannoma 07/12/2018   Hypogonadism male 04/10/2011   IMPOTENCE OF ORGANIC ORIGIN 10/01/2010   Osteoarthrosis, hand 04/17/2010   Attention deficit disorder 01/17/2010   Tinnitus 06/26/2009   Hyperlipidemia 01/18/2009    Essential hypertension 01/18/2009   Trigger finger, acquired 01/18/2009    ONSET DATE: 05/12/24   REFERRING DIAG: I63.9 (ICD-10-CM) - Acute CVA (cerebrovascular accident) (HCC)  THERAPY DIAG:  Dysarthria and anarthria  Dysphagia, oropharyngeal phase  Rationale for Evaluation and Treatment: Rehabilitation  SUBJECTIVE:   SUBJECTIVE STATEMENT: When I come home from work my voice hurts and so I tell the wife I won't be talking tonight.  Pt accompanied by: self  PERTINENT HISTORY: ADD, HLD, HTN, L3/4 laminectomy 2019  PAIN:  Are you having pain? No  FALLS: Has patient fallen in last 6 months?  No  PATIENT GOALS: My voice is my product. Pt would like to see his speech improve.   OBJECTIVE:  Note: Objective measures were completed at Evaluation unless otherwise noted.  DIAGNOSTIC FINDINGS:  MRI - 05/12/24: IMPRESSION: 1. 15 mm acute infarct within the left aspect of the pons. 2. Small cortical/subcortical infarct within the posterior right frontal lobe, late subacute or chronic. 3. Chronic lacunar infarcts within the left corona radiata and within/about the bilateral basal ganglia. 4. Background moderate-to-advanced cerebral white matter chronic small vessel ischemic disease. 5. Mild generalized cerebral atrophy.    PATIENT REPORTED OUTCOME MEASURES (PROM): Communication Effectiveness Survey: Pt responded with 3 (mostly effective) with being part of a conversation in a noisy environment, speaking to a friend when you are emotionally upset or angry, and  having a conversation with someone at a distance. Other responses were very effective.                                                                                                                            TREATMENT DATE:   06/30/24: Given S SLP discussed voice conservation strategies. SLP told pt he needs to focus more on not talking than when he can talk, given his personality and drive to still do  everything he was doing prior to CVA - his changes to voice, speech, and swallowing are evidence of this and still linger as deficits. SLP provided pt with examples of what this might look like at work. Bunny described s/sx of pharyngeal dysphagia with peanuts only, and today demonstrated rt anterior labial leakage with saliva. SLP told pt basics of MBS and pt is looking forward to this exam on 07/06/24.  06/22/24: Needs PROM next session.  SPEECH: Given pt's s SLP engaged pt in 12 minutes conversation regarding education for energy conservation and how pt can obtain this during his workday as he feels like he has to be at the office. Pt to implement these strategies. SLP also educated pt he must complete HEP at least 5 days/week as he is now completing 2 days/week.  SWALLOW: Pt reports larger sips still lead to coughing x3-4/day. SLP completed clinical swallow eval today and is recommending a MBSS. Lingual strength decr'd on lt, ROM WNL. Labial strength reduced on rt and pt reports with larger sips will have some anterior labial leakage. ROM WNL.Pt's cough appears weak; Throat clear appears WNL.  Will send over referral for MBSS per pt's therapy plan below.   06/05/24: Not time for clinical swallow assessment today given pt's time constraints. To complete NEXT session. Pt also needs PROM. Pt appears more subdued today reporting that wife told him his voice sounds different in the late afternoons. SLP told pt to record 30 seconds of reading and then read the same passage in late afternoon and bring to therapy for SLP to compare.  Given pt's need to leave in 25 minutes, SLP reviewed pt's HEP with him as he was not able to complete HEP largely due to allergic reaction to Plavix  (face swollen). He req'd usual min A with procedure, and model for air in cheeks and with overarticulation. He was told to complete HEP BID. SLP educated/re-educated pt about speech exaggeration and provided rationale.   05/29/24: SLP  guided pt through a HEP for speech intelligibility and for oral motor strengthening.  PATIENT EDUCATION: Education details: results of eval, likely need for MBSS, rationale for MBSS, etiology of pt's dysarthria, stamina re: speech  Person educated: Patient Education method: Explanation, Demonstration, Verbal cues, and Handouts Education comprehension: verbalized understanding, returned demonstration, verbal cues required, and needs further education   GOALS: Goals reviewed with patient? Yes, generally  SHORT TERM GOALS: Target date:  07/21/24 (due to visit number)  Pt will undergo clinical swallow  assessment, and a MBSS if clinically indicated Baseline: Goal status: met  2.  Pt will complete HEP for dysarthria with mod I in 2 sessions Baseline:  Goal status: INITIAL  3.  Pt will complete HEP for oral motor strength with mod I in 2 sessions Baseline:  Goal status: INITIAL  4.  Pt will demo speech compensations in sentence responses 90% success in 2 sessions Baseline:  Goal status: INITIAL   LONG TERM GOALS: Target date: 07/28/24  Pt's PROM will improve from initial administration Baseline:  Goal status: INITIAL  2.  Pt will complete HEP for dysarthria with in 2 sessions Baseline:  Goal status: INITIAL  3.  Pt will complete HEP for oral motor strength in 2 sessions Baseline:  Goal status: INITIAL  4.  Pt will demo speech compensations, sufficient for pt satisfaction about his speech, in 10 minutes mod complex conversation, in 3 sessions Baseline:  Goal status: INITIAL   ASSESSMENT:  CLINICAL IMPRESSION: Patient is a 73 y.o. male who was seen today for treatment of speech intelligibility and for swallowing after CVA May 12, 2024. See treatment date above for today's date for further details on today's session. 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 For additional information re: education see pt education above. Given  that pt cont to report some additional coughing and labial leakage with meals a clinical swallow assessment was completed today with an MBSS recommended as this is clinically appropriate at this time.   OBJECTIVE IMPAIRMENTS: include dysarthria and dysphagia. These impairments are limiting patient from return to work, household responsibilities, ADLs/IADLs, effectively communicating at home and in community, and safety when swallowing. Factors affecting potential to achieve goals and functional outcome are cooperation/participation level due to HEP completion necessary for improvement. Patient will benefit from skilled SLP services to address above impairments and improve overall function.  REHAB POTENTIAL: Good  PLAN:  SLP FREQUENCY: 1-2x/week  SLP DURATION: 8 weeks  PLANNED INTERVENTIONS: Aspiration precaution training, Pharyngeal strengthening exercises, Diet toleration management , Environmental controls, Trials of upgraded texture/liquids, Cueing hierachy, Internal/external aids, Oral motor exercises, Functional tasks, SLP instruction and feedback, Compensatory strategies, Patient/family education, [phone] Treatment of speech (30 or 45 min) , 92526 Treatment of swallowing function, and 07389 - evaluation of swallowing (clinical assessment).    Said Rueb, CCC-SLP 06/30/2024, 12:48 PM

## 2024-07-04 ENCOUNTER — Ambulatory Visit

## 2024-07-04 ENCOUNTER — Ambulatory Visit: Admitting: Physical Therapy

## 2024-07-04 ENCOUNTER — Encounter: Admitting: Occupational Therapy

## 2024-07-06 ENCOUNTER — Ambulatory Visit

## 2024-07-06 ENCOUNTER — Encounter: Admitting: Occupational Therapy

## 2024-07-06 ENCOUNTER — Ambulatory Visit: Admitting: Physical Therapy

## 2024-07-06 ENCOUNTER — Ambulatory Visit (HOSPITAL_COMMUNITY)
Admission: RE | Admit: 2024-07-06 | Discharge: 2024-07-06 | Disposition: A | Discharge status: Home / Self Care | Source: Ambulatory Visit | Attending: Family Medicine | Admitting: Family Medicine

## 2024-07-06 DIAGNOSIS — R1310 Dysphagia, unspecified: Secondary | ICD-10-CM

## 2024-07-06 DIAGNOSIS — R1312 Dysphagia, oropharyngeal phase: Secondary | ICD-10-CM

## 2024-07-06 DIAGNOSIS — R638 Other symptoms and signs concerning food and fluid intake: Secondary | ICD-10-CM | POA: Diagnosis not present

## 2024-07-06 DIAGNOSIS — Z8673 Personal history of transient ischemic attack (TIA), and cerebral infarction without residual deficits: Secondary | ICD-10-CM | POA: Diagnosis not present

## 2024-07-06 DIAGNOSIS — R1313 Dysphagia, pharyngeal phase: Secondary | ICD-10-CM | POA: Insufficient documentation

## 2024-07-06 DIAGNOSIS — R059 Cough, unspecified: Secondary | ICD-10-CM | POA: Insufficient documentation

## 2024-07-06 DIAGNOSIS — Z9049 Acquired absence of other specified parts of digestive tract: Secondary | ICD-10-CM | POA: Insufficient documentation

## 2024-07-06 NOTE — Evaluation (Signed)
 Modified Barium Swallow Study  Patient Details  Name: Jon Spencer MRN: [id number] Date of Birth: 1951-02-24  Today's Date: 07/06/2024  Modified Barium Swallow completed.  Full report located under Chart Review in the Imaging Section.  History of Present Illness 73 y.o. male referred for OP MBS by OP SLP, C. Schinke. Pt with hx July 2025 left pontine, posterior R frontal lobe (subacute or chronic) CVA leading to dysarthria and dysphagia. Reports coughing with liquids 3-4 x/day; waking up at night coughing on saliva.   Clinical Impression Pt presents wtih a mild pharyngeal dysphagia marked primarily by reduced epiglottic inversion over the larynx, leading to: 1) collection of residue above epiglottis - appears to be due to presence of multilevel cervical degenerative changes, inhibiting full closure of the larynx unless bolus is heavy; 2) occasional aspiration (trace) of liquids - this occurred once each with thin and nectar liquids. It was a miniscule amount and occurred during sequential swallows or when pt was mobilizing head. When he limited sips to single, controlled boluses, avoided head movement, there was no aspiration.    Recommend continuing current diet; crush pills or break in half when large (barium pill was not adminstered today due to incomplete epiglottic inversion). Continue f/u with OP SLP.  Factors that may increase risk of adverse event in presence of aspiration Noe & [name] 2021):  none  Swallow Evaluation Recommendations Recommendations: PO diet PO Diet Recommendation: Regular;Thin liquids (Level 0) Liquid Administration via: Cup;Straw Medication Administration: Crushed with puree Supervision: Patient able to self-feed Swallowing strategies  : Follow solids with liquids;Small bites/sips Oral care recommendations: Oral care BID (2x/day)     Nyonna Hargrove L. Vona, MA CCC/SLP Clinical Specialist - Acute Care SLP Acute Rehabilitation Services Office number  [phone]  Vona Palma Laurice 07/06/2024,12:45 PM

## 2024-07-10 ENCOUNTER — Ambulatory Visit

## 2024-07-13 ENCOUNTER — Ambulatory Visit: Attending: Nurse Practitioner

## 2024-07-13 DIAGNOSIS — R471 Dysarthria and anarthria: Secondary | ICD-10-CM | POA: Diagnosis not present

## 2024-07-13 DIAGNOSIS — R1312 Dysphagia, oropharyngeal phase: Secondary | ICD-10-CM | POA: Diagnosis not present

## 2024-07-13 NOTE — Patient Instructions (Signed)
   SWALLOWING EXERCISES Do these 5-6 days/week for 8 weeks then 2 days per week afterwards You can use 1 drop of liquid to help you swallow, if your mouth gets dry   Mendelsohn Maneuver - squeeze swallow exercise - Swallow, and squeeze tight to keep your Adam's Apple up - Hold the squeeze for 5-7 seconds - then relax - Do at least 10 reps, 3x/day       2. Super Swallow  - Take a breath and hold it  - get a sip and bear down  - Swallow then IMMEDIATELY cough  - Do at least 10 reps, 3x/day

## 2024-07-13 NOTE — Therapy (Signed)
 OUTPATIENT SPEECH LANGUAGE PATHOLOGY TREATMENT   Patient Name: Jon Spencer MRN: [id number] DOB:December 18, 1950, 73 y.o., male Today's Date: 07/13/2024  PCP: [name], MD REFERRING PROVIDER: [name] LABOR, NP [name], B., MD - doc)  END OF SESSION:  End of Session - 07/13/24 1025     Visit Number 5    Number of Visits 17    Date for SLP Re-Evaluation 07/28/24    SLP Start Time 1020    SLP Stop Time  1100    SLP Time Calculation (min) 40 min    Activity Tolerance Patient tolerated treatment well            Past Medical History:  Diagnosis Date   ADD 01/17/2010   Complication of anesthesia    takes awhile to wake up   History of back surgery 07/2018   Tumor removal   History of kidney stones    pt. 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/01/2010   OSTEOARTHRITIS, HAND 04/17/2010   TINNITUS 06/26/2009   TRIGGER FINGER 01/18/2009   Past Surgical History:  Procedure Laterality Date   APPENDECTOMY  1969   COLONOSCOPY     LAMINECTOMY N/A 07/12/2018   Procedure: Lumbar three-four Laminectomy for resection of intradural tumor;  Surgeon: Colon Shove, MD;  Location: [facility] OR;  Service: Neurosurgery;  Laterality: N/A;   VASECTOMY  1992   Patient Active Problem List   Diagnosis Date Noted   Dysphagia 05/14/2024   CKD (chronic kidney disease) 05/14/2024   Obesity 05/14/2024   Stroke determined by clinical assessment (HCC) 05/12/2024   Acute ischemic stroke (HCC) 05/12/2024   Aortic valve stenosis 05/05/2023   SOB (shortness of breath) 05/05/2023   Ganglion cyst of flexor tendon sheath of finger of left hand 04/29/2021   De Quervain's tenosynovitis, right 04/25/2020   Schwannoma 07/12/2018   Hypogonadism male 04/10/2011   IMPOTENCE OF ORGANIC ORIGIN 10/01/2010   Osteoarthrosis, hand 04/17/2010   Attention deficit disorder 01/17/2010   Tinnitus 06/26/2009   Hyperlipidemia 01/18/2009    Essential hypertension 01/18/2009   Trigger finger, acquired 01/18/2009    ONSET DATE: 05/12/24   REFERRING DIAG: I63.9 (ICD-10-CM) - Acute CVA (cerebrovascular accident) (HCC)  THERAPY DIAG:  Dysarthria and anarthria  Dysphagia, oropharyngeal phase  Rationale for Evaluation and Treatment: Rehabilitation  SUBJECTIVE:   SUBJECTIVE STATEMENT: She said to talk to you (about the MBS) and see what  you think. I think I did pretty well?  Pt accompanied by: self  PERTINENT HISTORY: ADD, HLD, HTN, L3/4 laminectomy 2019  PAIN:  Are you having pain? No  FALLS: Has patient fallen in last 6 months?  No  PATIENT GOALS: My voice is my product. Pt would like to see his speech improve.   OBJECTIVE:  Note: Objective measures were completed at Evaluation unless otherwise noted.  DIAGNOSTIC FINDINGS:  MRI - 05/12/24: IMPRESSION: 1. 15 mm acute infarct within the left aspect of the pons. 2. Small cortical/subcortical infarct within the posterior right frontal lobe, late subacute or chronic. 3. Chronic lacunar infarcts within the left corona radiata and within/about the bilateral basal ganglia. 4. Background moderate-to-advanced cerebral white matter chronic small vessel ischemic disease. 5. Mild generalized cerebral atrophy.  MBS 07/06/24: Clinical Impression: Pt presents wtih a mild pharyngeal dysphagia marked primarily by reduced epiglottic inversion over the larynx, leading to: 1) collection of residue above epiglottis - appears to be due to presence of multilevel  cervical degenerative changes, inhibiting full closure of the larynx unless bolus is heavy; 2) occasional aspiration (trace) of liquids - this occurred once each with thin and nectar liquids. It was a miniscule amount and occurred during sequential swallows or when pt was mobilizing head. When he limited sips to single, controlled boluses, avoided head movement, there was no aspiration.  Recommend continuing current diet;  crush pills or break in half when large (barium pill was not adminstered today due to incomplete epiglottic inversion). Continue f/u with OP SLP.   Factors that may increase risk of adverse event in presence of aspiration Noe & [name] 2021):  none   Recommendations/Plan: Swallowing Evaluation Recommendations Swallowing Evaluation Recommendations Recommendations: PO diet PO Diet Recommendation: Regular; Thin liquids (Level 0) Liquid Administration via: Cup; Straw Medication Administration: Crushed with puree Supervision: Patient able to self-feed Swallowing strategies  : Follow solids with liquids; Small bites/sips Oral care recommendations: Oral care BID (2x/day)    PATIENT REPORTED OUTCOME MEASURES (PROM): Communication Effectiveness Survey: Pt responded with 3 (mostly effective) with being part of a conversation in a noisy environment, speaking to a friend when you are emotionally upset or angry, and having a conversation with someone at a distance. Other responses were very effective.                                                                                                                            TREATMENT DATE:   07/13/24: SWALLOWING: Reviewed the MBS with pt. He c/o a blob of mucous in his throat at the level of the thyroid  (suspect this is in the vallecula based on results of MBS). Water sips did not clear completely but lessened the amount, teaspoons applesauce and water flushes with effortful swallows also decr'd the amount according to pt but nothing completely eliminated this. Later in session after having a swallow of water, pt hocked and stated the remainder of the mucous appeared to have cleared. SLP suggested pt drink decaf (has two cups of coffee and a cup of hot tea) and then continue his two cups of hot lemonade in order to clear (suspected) secretions from the previous evening.  SLP taught pt Claudette and pt held swallow for approx 1-2 seconds prior to  thyroid  relaxation. SLP encouraged pt to cont with this exercise x10 TID, telling pt that he should see improvement in hold times if he is consistent with prescribed reps and frequency.   06/30/24: Given S SLP discussed voice conservation strategies. SLP told pt he needs to focus more on not talking than when he can talk, given his personality and drive to still do everything he was doing prior to CVA - his changes to voice, speech, and swallowing are evidence of this and still linger as deficits. SLP provided pt with examples of what this might look like at work. Kristian described s/sx of pharyngeal dysphagia with peanuts only, and today demonstrated rt anterior labial leakage with saliva. SLP told pt  basics of MBS and pt is looking forward to this exam on 07/06/24.  06/22/24: Needs PROM next session.  SPEECH: Given pt's s SLP engaged pt in 12 minutes conversation regarding education for energy conservation and how pt can obtain this during his workday as he feels like he has to be at the office. Pt to implement these strategies. SLP also educated pt he must complete HEP at least 5 days/week as he is now completing 2 days/week.  SWALLOW: Pt reports larger sips still lead to coughing x3-4/day. SLP completed clinical swallow eval today and is recommending a MBSS. Lingual strength decr'd on lt, ROM WNL. Labial strength reduced on rt and pt reports with larger sips will have some anterior labial leakage. ROM WNL.Pt's cough appears weak; Throat clear appears WNL.  Will send over referral for MBSS per pt's therapy plan below.   06/05/24: Not time for clinical swallow assessment today given pt's time constraints. To complete NEXT session. Pt also needs PROM. Pt appears more subdued today reporting that wife told him his voice sounds different in the late afternoons. SLP told pt to record 30 seconds of reading and then read the same passage in late afternoon and bring to therapy for SLP to compare.  Given pt's need  to leave in 25 minutes, SLP reviewed pt's HEP with him as he was not able to complete HEP largely due to allergic reaction to Plavix  (face swollen). He req'd usual min A with procedure, and model for air in cheeks and with overarticulation. He was told to complete HEP BID. SLP educated/re-educated pt about speech exaggeration and provided rationale.   05/29/24: SLP guided pt through a HEP for speech intelligibility and for oral motor strengthening.  PATIENT EDUCATION: Education details: results of eval, likely need for MBSS, rationale for MBSS, etiology of pt's dysarthria, stamina re: speech  Person educated: Patient Education method: Explanation, Demonstration, Verbal cues, and Handouts Education comprehension: verbalized understanding, returned demonstration, verbal cues required, and needs further education   GOALS: Goals reviewed with patient? Yes, generally  SHORT TERM GOALS: Target date:  07/21/24 (due to visit number)  Pt will undergo clinical swallow assessment, and a MBSS if clinically indicated Baseline: Goal status: met  2.  Pt will complete HEP for dysarthria with mod I in 2 sessions Baseline:  Goal status: INITIAL  3.  Pt will complete HEP for oral motor strength with mod I in 2 sessions Baseline:  Goal status: INITIAL  4.  Pt will demo speech compensations in sentence responses 90% success in 2 sessions Baseline:  Goal status: INITIAL   LONG TERM GOALS: Target date: 07/28/24  Pt's PROM will improve from initial administration Baseline:  Goal status: INITIAL  2.  Pt will complete HEP for dysarthria with in 2 sessions Baseline:  Goal status: INITIAL  3.  Pt will complete HEP for oral motor strength in 2 sessions Baseline:  Goal status: INITIAL  4.  Pt will demo speech compensations, sufficient for pt satisfaction about his speech, in 10 minutes mod complex conversation, in 3 sessions Baseline:  Goal status: INITIAL   ASSESSMENT:  CLINICAL  IMPRESSION: Patient is a 73 y.o. male who was seen today for treatment of speech intelligibility and/or for swallowing after CVA May 12, 2024. Pt with MBS 07/06/24 which was reviewed with pt today. See treatment date above for today's date for further details on today's session. 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 For additional information  re: education see pt education above.    OBJECTIVE IMPAIRMENTS: include dysarthria and dysphagia. These impairments are limiting patient from return to work, household responsibilities, ADLs/IADLs, effectively communicating at home and in community, and safety when swallowing. Factors affecting potential to achieve goals and functional outcome are cooperation/participation level due to HEP completion necessary for improvement. Patient will benefit from skilled SLP services to address above impairments and improve overall function.  REHAB POTENTIAL: Good  PLAN:  SLP FREQUENCY: 1-2x/week  SLP DURATION: 8 weeks  PLANNED INTERVENTIONS: Aspiration precaution training, Pharyngeal strengthening exercises, Diet toleration management , Environmental controls, Trials of upgraded texture/liquids, Cueing hierachy, Internal/external aids, Oral motor exercises, Functional tasks, SLP instruction and feedback, Compensatory strategies, Patient/family education, [phone] Treatment of speech (30 or 45 min) , 92526 Treatment of swallowing function, and 07389 - evaluation of swallowing (clinical assessment).    Shalyn Koral, CCC-SLP 07/13/2024, 10:26 AM

## 2024-07-14 ENCOUNTER — Ambulatory Visit: Payer: Self-pay | Admitting: *Deleted

## 2024-07-14 ENCOUNTER — Ambulatory Visit: Payer: Self-pay

## 2024-07-14 NOTE — Telephone Encounter (Signed)
 FYI Only or Action Required?: Action required by provider: Medication change from Brilinta  due to symptoms.  Patient was last seen in primary care on 06/02/2024 by Micheal Wolm ORN, MD.  Called Nurse Triage reporting Fatigue.  Symptoms began about a month ago.  Interventions attempted: Rest, hydration, or home remedies.  Symptoms are: gradually worsening.  Triage Disposition: See Physician Within 24 Hours  Patient/caregiver understands and will follow disposition?: No, refuses disposition     Copied from CRM [id number]. Topic: Clinical - Red Word Triage >> Jul 14, 2024  3:45 PM Hamdi H wrote: Red Word that prompted transfer to Nurse Triage: Patient is feeling very weak due to medication. Reason for Disposition  [1] MODERATE weakness (e.g., interferes with work, school, normal activities) AND [2] persists > 3 days  Answer Assessment - Initial Assessment Questions Patient requesting an alternative blood thinner medication. Patient states symptoms are hard to handle.Pt already scheduled for an in office appointment on Monday. This RN offered to schedule patient in UC due to severity of symptoms and patient refused and states he is not doing that.   1. DESCRIPTION: Describe how you are feeling.     Weak, sleepy, no energy  2. SEVERITY: How bad is it?  Can you stand and walk?     Moderate 3. ONSET: When did these symptoms begin? (e.g., hours, days, weeks, months)     A couple of weeks ago  4. CAUSE: What do you think is causing the weakness or fatigue? (e.g., not drinking enough fluids, medical problem, trouble sleeping)     Blood thinner medication  5. NEW MEDICINES:  Have you started on any new medicines recently? (e.g., opioid pain medicines, benzodiazepines, muscle relaxants, antidepressants, antihistamines, neuroleptics, beta blockers)     On a new blood thinner  6. OTHER SYMPTOMS: Do you have any other symptoms? (e.g., chest pain, fever, cough, SOB, vomiting,  diarrhea, bleeding, other areas of pain)     Top of leg hurts  Protocols used: Weakness (Generalized) and Fatigue-A-AH

## 2024-07-14 NOTE — Telephone Encounter (Signed)
 FYI Only or Action Required?: Action required by provider: clinical question for provider and if alternative medication should be ordered other than brilinta .  Patient was last seen in primary care on 06/02/2024 by Micheal Wolm ORN, MD.  Called Nurse Triage reporting Fatigue.  Symptoms began several weeks ago. Since 06/14/24  Interventions attempted: Rest, hydration, or home remedies.  Symptoms are: gradually worsening.  Triage Disposition: See Physician Within 24 Hours  Patient/caregiver understands and will follow disposition?: Yes   Please advise medication brilinta  last ordered by Dr. Rosemarie, patient would rather see PCP.            Copied from CRM [id number]. Topic: Clinical - Prescription Issue >> Jul 14, 2024  8:35 AM Mercedes MATSU wrote: Reason for CRM: Patient called in stating that he was prescribed a medication ticagrelor  (BRILINTA ) 90 MG TABS tablet, and the side effects are making him very weak and he is having a hard time functioning. Patient is requesting an alternative medication. Patient is requesting a call back and can be reached at [phone].  He states has been dealing with the side effects for a while and he can no longer deal with it. Reason for Disposition  [1] MODERATE weakness (e.g., interferes with work, school, normal activities) AND [2] persists > 3 days  Answer Assessment - Initial Assessment Questions Appt scheduled for 07/17/24 with PCP. Recommended if sx worsen go to UC/ED. Recommended patient to call neuro due to Dr. Rosemarie ordered brilinta  patient is requesting to change/ find alternative. Patient reports he would rather see PCP. Please advise        1. DESCRIPTION: Describe how you are feeling.     Weakness, exhausted all of the time since starting brilinta  s/p stroke 9 weeks ago  2. SEVERITY: How bad is it?  Can you stand and walk?     Worsening  3. ONSET: When did these symptoms begin? (e.g., hours, days, weeks, months)     Patient feels After starting brilinta  06/14/24 4. CAUSE: What do you think is causing the weakness or fatigue? (e.g., not drinking enough fluids, medical problem, trouble sleeping)     Trouble sleeping urinates multiple times during the night , sees urologist. Feels medication brlinita could be cause of feeling exhausted all of the time. 5. NEW MEDICINES:  Have you started on any new medicines recently? (e.g., opioid pain medicines, benzodiazepines, muscle relaxants, antidepressants, antihistamines, neuroleptics, beta blockers)     brilinta  6. OTHER SYMPTOMS: Do you have any other symptoms? (e.g., chest pain, fever, cough, SOB, vomiting, diarrhea, bleeding, other areas of pain)     Feels tired all of the time, weakness like exhausted. Hx stroke 9 weeks ago . Denies worsening weakness on either side of body. Able to work but feels tired. 7. PREGNANCY: Is there any chance you are pregnant? When was your last menstrual period?     na  Protocols used: Weakness (Generalized) and Fatigue-A-AH

## 2024-07-14 NOTE — Telephone Encounter (Signed)
 Please see previous encounter

## 2024-07-14 NOTE — Telephone Encounter (Signed)
 Patient informed to contact Dr. Rosemarie office in regards to medication so he can be aware.

## 2024-07-14 NOTE — Telephone Encounter (Signed)
 Patient informed of the message below and voiced understanding

## 2024-07-14 NOTE — Telephone Encounter (Signed)
 Left message for the patient for to return my call to inform him to contact Dr. Bucky office in regards to medication since Brilinta  was prescribed by them.

## 2024-07-17 ENCOUNTER — Ambulatory Visit: Admitting: Family Medicine

## 2024-07-26 DIAGNOSIS — R351 Nocturia: Secondary | ICD-10-CM | POA: Diagnosis not present

## 2024-07-26 DIAGNOSIS — N401 Enlarged prostate with lower urinary tract symptoms: Secondary | ICD-10-CM | POA: Diagnosis not present

## 2024-07-28 ENCOUNTER — Encounter: Payer: Self-pay | Admitting: Family Medicine

## 2024-07-28 ENCOUNTER — Ambulatory Visit (INDEPENDENT_AMBULATORY_CARE_PROVIDER_SITE_OTHER): Admitting: Family Medicine

## 2024-07-28 VITALS — BP 120/60 | HR 88 | Temp 98.1°F | Wt 189.4 lb

## 2024-07-28 DIAGNOSIS — Z8673 Personal history of transient ischemic attack (TIA), and cerebral infarction without residual deficits: Secondary | ICD-10-CM | POA: Diagnosis not present

## 2024-07-28 DIAGNOSIS — I10 Essential (primary) hypertension: Secondary | ICD-10-CM | POA: Diagnosis not present

## 2024-07-28 DIAGNOSIS — I350 Nonrheumatic aortic (valve) stenosis: Secondary | ICD-10-CM | POA: Diagnosis not present

## 2024-07-28 DIAGNOSIS — E785 Hyperlipidemia, unspecified: Secondary | ICD-10-CM

## 2024-07-28 DIAGNOSIS — R5383 Other fatigue: Secondary | ICD-10-CM | POA: Diagnosis not present

## 2024-07-28 LAB — COMPREHENSIVE METABOLIC PANEL WITH GFR
ALT: 15 U/L (ref 0–53)
AST: 14 U/L (ref 0–37)
Albumin: 4.2 g/dL (ref 3.5–5.2)
Alkaline Phosphatase: 71 U/L (ref 39–117)
BUN: 23 mg/dL (ref 6–23)
CO2: 28 meq/L (ref 19–32)
Calcium: 9.5 mg/dL (ref 8.4–10.5)
Chloride: 106 meq/L (ref 96–112)
Creatinine, Ser: 1.44 mg/dL (ref 0.40–1.50)
GFR: 47.75 mL/min — ABNORMAL LOW (ref >60.00)
Glucose, Bld: 90 mg/dL (ref 70–99)
Potassium: 3.8 meq/L (ref 3.5–5.1)
Sodium: 141 meq/L (ref 135–145)
Total Bilirubin: 0.5 mg/dL (ref 0.2–1.2)
Total Protein: 6.7 g/dL (ref 6.0–8.3)

## 2024-07-28 LAB — LIPID PANEL
Cholesterol: 186 mg/dL (ref 0–200)
HDL: 45.8 mg/dL (ref >39.00)
LDL Cholesterol: 113 mg/dL — ABNORMAL HIGH (ref 0–99)
NonHDL: 139.92
Total CHOL/HDL Ratio: 4
Triglycerides: 133 mg/dL (ref 0.0–149.0)
VLDL: 26.6 mg/dL (ref 0.0–40.0)

## 2024-07-28 LAB — TSH: TSH: 1.17 u[IU]/mL (ref 0.35–5.50)

## 2024-07-28 LAB — HEPATIC FUNCTION PANEL
ALT: 15 U/L (ref 0–53)
AST: 14 U/L (ref 0–37)
Albumin: 4.2 g/dL (ref 3.5–5.2)
Alkaline Phosphatase: 71 U/L (ref 39–117)
Bilirubin, Direct: 0.1 mg/dL (ref 0.0–0.3)
Total Bilirubin: 0.5 mg/dL (ref 0.2–1.2)
Total Protein: 6.7 g/dL (ref 6.0–8.3)

## 2024-07-28 NOTE — Progress Notes (Signed)
 Established Patient Office Visit  Subjective   Patient ID: Jon Spencer, male    DOB: April 02, 1951  Age: 73 y.o. MRN: [id number]  Chief Complaint  Patient presents with   Fatigue    HPI   Corion is seen for medical follow-up.  He had acute CVA back in July.  He is still working with speech therapy but speech is gradually improving.  Still has some generalized fatigue and he wonders if some of this may be related to Brilinta .  Also, he came off testosterone  injections following his stroke and realizes some of his fatigue may be related to that.  He also realizes that sometimes can have fatigue related to the stroke itself.  He is walking diligently and building up some strength and stamina over time.  He had fairly severe allergic reaction with Plavix  with angioedema type symptoms but is tolerating Brilinta  although he queries whether this may be triggering some fatigue issues.  Strong family history of hypothyroidism in both grandparents.  Has not had thyroid  check in about 7 years.  He has severe aortic valve calcification and moderate stenosis by recent echo.  Does have occasional dyspnea with exertion but no significant dizziness.  No syncope.  No recent chest pains.  Was placed on rosuvastatin  following his stroke and needs follow-up lipid and hepatic panel today.  Past Medical History:  Diagnosis Date   ADD 01/17/2010   Complication of anesthesia    takes awhile to wake up   History of back surgery 07/2018   Tumor removal   History of kidney stones    pt. 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/01/2010   OSTEOARTHRITIS, HAND 04/17/2010   TINNITUS 06/26/2009   TRIGGER FINGER 01/18/2009   Past Surgical History:  Procedure Laterality Date   APPENDECTOMY  1969   COLONOSCOPY     LAMINECTOMY N/A 07/12/2018   Procedure: Lumbar three-four Laminectomy for resection of intradural tumor;  Surgeon: Colon Shove,  MD;  Location: [facility] OR;  Service: Neurosurgery;  Laterality: N/A;   VASECTOMY  1992    reports that he has never smoked. He has never used smokeless tobacco. He reports current alcohol use of about 1.0 - 4.0 standard drink of alcohol per week. He reports that he does not use drugs. family history includes Arthritis in an other family member; Bladder Cancer in his father; Hyperlipidemia in an other family member; Hypertension in an other family member; Stroke in his mother and sister; Sudden death in an other family member. Allergies  Allergen Reactions   Plavix  [Clopidogrel ] Anaphylaxis   Tolectin [Tolmetin Sodium] Other (See Comments)    Fever 104, breathing problems   Meloxicam Other (See Comments)    REACTION: Flushed, high temp   Univasc [Moexipril Hydrochloride] Cough    coughing    Review of Systems  Constitutional:  Positive for malaise/fatigue. Negative for chills and fever.  Eyes:  Negative for blurred vision.  Respiratory:  Negative for cough.   Cardiovascular:  Negative for chest pain and leg swelling.  Neurological:  Negative for dizziness, weakness and headaches.      Objective:     BP 120/60   Pulse 88   Temp 98.1 F (36.7 C) (Oral)   Wt 189 lb 6.4 oz (85.9 kg)   SpO2 97%   BMI 27.97 kg/m  BP Readings from Last 3 Encounters:  07/28/24 120/60  06/03/24 (!) 155/79  06/02/24  132/60   Wt Readings from Last 3 Encounters:  07/28/24 189 lb 6.4 oz (85.9 kg)  06/02/24 200 lb (90.7 kg)  06/02/24 204 lb 12.8 oz (92.9 kg)      Physical Exam Vitals reviewed.  Constitutional:      Appearance: Normal appearance. He is well-developed.  Eyes:     Pupils: Pupils are equal, round, and reactive to light.  Neck:     Thyroid : No thyromegaly.  Cardiovascular:     Rate and Rhythm: Normal rate and regular rhythm.     Heart sounds: Murmur heard.     Comments: 2/6 to 3/6 systolic murmur right upper sternal border and soft murmur left sternal border Pulmonary:     Effort:  Pulmonary effort is normal. No respiratory distress.     Breath sounds: Normal breath sounds. No wheezing or rales.  Musculoskeletal:     Cervical back: Neck supple.     Right lower leg: No edema.     Left lower leg: No edema.  Neurological:     Mental Status: He is alert and oriented to person, place, and time.      No results found for any visits on 07/28/24.    The ASCVD Risk score (Arnett DK, et al., 2019) failed to calculate for the following reasons:   Risk score cannot be calculated because patient has a medical history suggesting prior/existing ASCVD    Assessment & Plan:   Problem List Items Addressed This Visit       Unprioritized   Essential hypertension - Primary   Other Visit Diagnoses       History of CVA (cerebrovascular accident)       Relevant Orders   Lipid panel   CMP     Fatigue, unspecified type       Relevant Orders   TSH     History of recent acute CVA back in July.  He has seen neurologist.  Previous intolerance with Plavix  and is on Brilinta .  He has some fatigue which could be medication related but also perhaps related to his previous stroke and may be more importantly coming off testosterone  therapy.  He is advised to stay off testosterone  therapy for at least another 6 to 12 months  Check labs above including lipid, CMP, TSH  Blood pressure remains well-controlled on amlodipine  and losartan  and continue current regimen  He has aortic valve stenosis which is moderate by recent echocardiogram.  Currently not symptomatic other than some mild dyspnea with extreme exertion.  We discussed and reviewed signs and symptoms of progression to watch for any progressive dizziness, shortness of breath, chest pain, or syncope  No follow-ups on file.    Wolm Scarlet, MD

## 2024-07-29 ENCOUNTER — Other Ambulatory Visit: Payer: Self-pay | Admitting: Family Medicine

## 2024-07-29 ENCOUNTER — Ambulatory Visit: Payer: Self-pay | Admitting: Family Medicine

## 2024-07-29 DIAGNOSIS — E785 Hyperlipidemia, unspecified: Secondary | ICD-10-CM

## 2024-07-31 MED ORDER — ROSUVASTATIN CALCIUM 10 MG PO TABS: 10.0000 mg | ORAL_TABLET | Freq: Every day | ORAL | 1 refills | Status: DC

## 2024-08-03 ENCOUNTER — Encounter

## 2024-08-04 ENCOUNTER — Other Ambulatory Visit: Payer: Self-pay | Admitting: Family Medicine

## 2024-08-08 ENCOUNTER — Telehealth: Payer: Self-pay | Admitting: Family Medicine

## 2024-08-08 ENCOUNTER — Ambulatory Visit: Attending: Nurse Practitioner

## 2024-08-08 DIAGNOSIS — R1312 Dysphagia, oropharyngeal phase: Secondary | ICD-10-CM | POA: Insufficient documentation

## 2024-08-08 DIAGNOSIS — R471 Dysarthria and anarthria: Secondary | ICD-10-CM | POA: Diagnosis not present

## 2024-08-08 NOTE — Therapy (Signed)
 OUTPATIENT SPEECH LANGUAGE PATHOLOGY TREATMENT   Patient Name: Jon Spencer MRN: [id number] DOB:03/19/1951, 73 y.o., male Today's Date: 08/08/2024  PCP: Jon Pin, MD REFERRING PROVIDER: [name] Spencer LABOR, NP Jon Spencer, B., MD - doc)  END OF SESSION:  End of Session - 08/08/24 1557     Visit Number 6    Number of Visits 17    Date for Recertification  07/28/24    SLP Start Time 1519    SLP Stop Time  1549    SLP Time Calculation (min) 30 min    Activity Tolerance Patient tolerated treatment well             Past Medical History:  Diagnosis Date   ADD 01/17/2010   Complication of anesthesia    takes awhile to wake up   History of back surgery 07/2018   Tumor removal   History of kidney stones    pt. 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/01/2010   OSTEOARTHRITIS, HAND 04/17/2010   TINNITUS 06/26/2009   TRIGGER FINGER 01/18/2009   Past Surgical History:  Procedure Laterality Date   APPENDECTOMY  1969   COLONOSCOPY     LAMINECTOMY N/A 07/12/2018   Procedure: Lumbar three-four Laminectomy for resection of intradural tumor;  Surgeon: Jon Shove, MD;  Location: [facility] OR;  Service: Neurosurgery;  Laterality: N/A;   VASECTOMY  1992   Patient Active Problem List   Diagnosis Date Noted   Dysphagia 05/14/2024   CKD (chronic kidney disease) 05/14/2024   Obesity 05/14/2024   Stroke determined by clinical assessment (HCC) 05/12/2024   Acute ischemic stroke (HCC) 05/12/2024   Aortic valve stenosis 05/05/2023   SOB (shortness of breath) 05/05/2023   Ganglion cyst of flexor tendon sheath of finger of left hand 04/29/2021   De Quervain's tenosynovitis, right 04/25/2020   Schwannoma 07/12/2018   Hypogonadism male 04/10/2011   IMPOTENCE OF ORGANIC ORIGIN 10/01/2010   Osteoarthrosis, hand 04/17/2010   Attention deficit disorder 01/17/2010   Tinnitus 06/26/2009   Hyperlipidemia 01/18/2009    Essential hypertension 01/18/2009   Trigger finger, acquired 01/18/2009   Speech Therapy Progress Note  Dates of Reporting Period: 05/29/24 to present  Subjective Statement: Pt has been seen for ST targeting speech and swallowing abilities. MBS in September Id'd mild pharyngeal dysphagia with scant aspiration with consecutive swallows. See that report for more details.   Objective: See below  Goal Update: See below  Plan: See pt for 1-2 more sessions  Reason Skilled Services are Required: Ensure pt knows what to do after d/c, ensure progress over time.   ONSET DATE: 05/12/24   REFERRING DIAG: I63.9 (ICD-10-CM) - Acute CVA (cerebrovascular accident) (HCC)  THERAPY DIAG:  Dysarthria and anarthria  Dysphagia, oropharyngeal phase  Rationale for Evaluation and Treatment: Rehabilitation  SUBJECTIVE:   SUBJECTIVE STATEMENT: Swallowing has improved - talking has improved.  Pt accompanied by: self  PERTINENT HISTORY: ADD, HLD, HTN, L3/4 laminectomy 2019  PAIN:  Are you having pain? No  FALLS: Has patient fallen in last 6 months?  No  PATIENT GOALS: My voice is my product. Pt would like to see his speech improve.   OBJECTIVE:  Note: Objective measures were completed at Evaluation unless otherwise noted.  DIAGNOSTIC FINDINGS:  MRI - 05/12/24: IMPRESSION: 1. 15 mm acute infarct within the left aspect of the pons. 2. Small cortical/subcortical infarct within the posterior right frontal lobe, late subacute or chronic.  3. Chronic lacunar infarcts within the left corona radiata and within/about the bilateral basal ganglia. 4. Background moderate-to-advanced cerebral white matter chronic small vessel ischemic disease. 5. Mild generalized cerebral atrophy.  MBS 07/06/24: Clinical Impression: Pt presents wtih a mild pharyngeal dysphagia marked primarily by reduced epiglottic inversion over the larynx, leading to: 1) collection of residue above epiglottis - appears to be due  to presence of multilevel cervical degenerative changes, inhibiting full closure of the larynx unless bolus is heavy; 2) occasional aspiration (trace) of liquids - this occurred once each with thin and nectar liquids. It was a miniscule amount and occurred during sequential swallows or when pt was mobilizing head. When he limited sips to single, controlled boluses, avoided head movement, there was no aspiration.  Recommend continuing current diet; crush pills or break in half when large (barium pill was not adminstered today due to incomplete epiglottic inversion). Continue f/u with OP SLP.   Factors that may increase risk of adverse event in presence of aspiration Jon Spencer & Jon Spencer 2021):  none   Recommendations/Plan: Swallowing Evaluation Recommendations Swallowing Evaluation Recommendations Recommendations: PO diet PO Diet Recommendation: Regular; Thin liquids (Level 0) Liquid Administration via: Cup; Straw Medication Administration: Crushed with puree Supervision: Patient able to self-feed Swallowing strategies  : Follow solids with liquids; Small bites/sips Oral care recommendations: Oral care BID (2x/day)    PATIENT REPORTED OUTCOME MEASURES (PROM): Communication Effectiveness Survey: Pt responded with 3 (mostly effective) with being part of a conversation in a noisy environment, speaking to a friend when you are emotionally upset or angry, and having a conversation with someone at a distance. Other responses were very effective.                                                                                                                            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 Pt has incorporated a colleague and his wife to let him know if my speech starts to deteriorate. Jon Spencer states that on a normal day he can go until 4pm without speech fatigue but today is more difficult - pt talked about 50% more  than he was expecting. Speech sounds about like it did when he entered for his previous therapy session. SWALLOW: Pt reports less frequent and less significant labial leakage as well as less-frequent coughing (x3-4/week). Today pt req'd mod cues for completing swallow HEP faded to independent by task end. SLP strongly reiterated pt complete 30 reps/day of each of these exercises and told him rationale. SLP and pt agreed pt could reduce frequency of ST.   07/13/24: SWALLOWING: Reviewed the MBS with pt. He c/o a blob of mucous in his throat at the level of the thyroid  (suspect this is in the vallecula based on results of MBS). Water sips did not clear completely but lessened the amount, teaspoons applesauce and water flushes with effortful swallows  also decr'd the amount according to pt but nothing completely eliminated this. Later in session after having a swallow of water, pt hocked and stated the remainder of the mucous appeared to have cleared. SLP suggested pt drink decaf (has two cups of coffee and a cup of hot tea) and then continue his two cups of hot lemonade in order to clear (suspected) secretions from the previous evening.  SLP taught pt Claudette and pt held swallow for approx 1-2 seconds prior to thyroid  relaxation. SLP encouraged pt to cont with this exercise x10 TID, telling pt that he should see improvement in hold times if he is consistent with prescribed reps and frequency.   06/30/24: Given S SLP discussed voice conservation strategies. SLP told pt he needs to focus more on not talking than when he can talk, given his personality and drive to still do everything he was doing prior to CVA - his changes to voice, speech, and swallowing are evidence of this and still linger as deficits. SLP provided pt with examples of what this might look like at work. Tsuneo described s/sx of pharyngeal dysphagia with peanuts only, and today demonstrated rt anterior labial leakage with saliva. SLP told pt  basics of MBS and pt is looking forward to this exam on 07/06/24.  06/22/24: Needs PROM next session.  SPEECH: Given pt's s SLP engaged pt in 12 minutes conversation regarding education for energy conservation and how pt can obtain this during his workday as he feels like he has to be at the office. Pt to implement these strategies. SLP also educated pt he must complete HEP at least 5 days/week as he is now completing 2 days/week.  SWALLOW: Pt reports larger sips still lead to coughing x3-4/day. SLP completed clinical swallow eval today and is recommending a MBSS. Lingual strength decr'd on lt, ROM WNL. Labial strength reduced on rt and pt reports with larger sips will have some anterior labial leakage. ROM WNL.Pt's cough appears weak; Throat clear appears WNL.  Will send over referral for MBSS per pt's therapy plan below.   06/05/24: Not time for clinical swallow assessment today given pt's time constraints. To complete NEXT session. Pt also needs PROM. Pt appears more subdued today reporting that wife told him his voice sounds different in the late afternoons. SLP told pt to record 30 seconds of reading and then read the same passage in late afternoon and bring to therapy for SLP to compare.  Given pt's need to leave in 25 minutes, SLP reviewed pt's HEP with him as he was not able to complete HEP largely due to allergic reaction to Plavix  (face swollen). He req'd usual min A with procedure, and model for air in cheeks and with overarticulation. He was told to complete HEP BID. SLP educated/re-educated pt about speech exaggeration and provided rationale.   05/29/24: SLP guided pt through a HEP for speech intelligibility and for oral motor strengthening.  PATIENT EDUCATION: Education details: results of eval, likely need for MBSS, rationale for MBSS, etiology of pt's dysarthria, stamina re: speech  Person educated: Patient Education method: Explanation, Demonstration, Verbal cues, and  Handouts Education comprehension: verbalized understanding, returned demonstration, verbal cues required, and needs further education   GOALS: Goals reviewed with patient? Yes, generally  SHORT TERM GOALS: Target date:   08/15/24 (due to visit number)  Pt will undergo clinical swallow assessment, and a MBSS if clinically indicated Baseline: Goal status: met  2.  Pt will complete HEP for dysarthria with mod  I in 2 sessions Baseline:  Goal status: deferred  3.  Pt will complete HEP for oral motor strength with mod I in 2 sessions Baseline:  Goal status: INITIAL  4.  Pt will demo speech compensations in sentence responses 90% success in 2 sessions Baseline:  Goal status: INITIAL   LONG TERM GOALS: Target date:  09/01/24  Pt's PROM will improve from initial administration Baseline:  Goal status: not met, continue  2.  Pt will complete HEP for dysarthria with in 2 sessions Baseline:  Goal status: deferred - pt is talking to practice  3.  Pt will complete HEP for oral motor strength in 2 sessions Baseline:  Goal status: not met, continue  4.  Pt will demo speech compensations, sufficient for pt satisfaction about his speech, in 10 minutes mod complex conversation, in 3 sessions Baseline:  Goal status: not met, continue   ASSESSMENT:  CLINICAL IMPRESSION: Recert today. Patient is a 73 y.o. male who was seen today for treatment of speech intelligibility and/or for swallowing after CVA May 12, 2024. STG #2 and LTG #2 deferred. SLP modified pt's STG reporting date and LTG reporting date due. Pt indicates precautions from MBS aren't necessary due to swallowing improved. See treatment date above for today's date for further details on today's session. 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 For additional information re: education see pt education above.    OBJECTIVE IMPAIRMENTS: include dysarthria and dysphagia. These impairments  are limiting patient from return to work, household responsibilities, ADLs/IADLs, effectively communicating at home and in community, and safety when swallowing. Factors affecting potential to achieve goals and functional outcome are cooperation/participation level due to HEP completion necessary for improvement. Patient will benefit from skilled SLP services to address above impairments and improve overall function.  REHAB POTENTIAL: Good  PLAN:  SLP FREQUENCY: 1-2x/week  SLP DURATION: 8 weeks  PLANNED INTERVENTIONS: Aspiration precaution training, Pharyngeal strengthening exercises, Diet toleration management , Environmental controls, Trials of upgraded texture/liquids, Cueing hierachy, Internal/external aids, Oral motor exercises, Functional tasks, SLP instruction and feedback, Compensatory strategies, Patient/family education, [phone] Treatment of speech (30 or 45 min) , 92526 Treatment of swallowing function, and 07389 - evaluation of swallowing (clinical assessment).    Nasra Counce, CCC-SLP 08/08/2024, 3:58 PM

## 2024-08-08 NOTE — Telephone Encounter (Signed)
 Pt dropped off paperwork to be signed by Burchette. Paperwork in folder

## 2024-08-10 ENCOUNTER — Ambulatory Visit

## 2024-08-15 ENCOUNTER — Ambulatory Visit

## 2024-08-15 DIAGNOSIS — R471 Dysarthria and anarthria: Secondary | ICD-10-CM

## 2024-08-15 DIAGNOSIS — R1312 Dysphagia, oropharyngeal phase: Secondary | ICD-10-CM

## 2024-08-15 NOTE — Therapy (Signed)
 OUTPATIENT SPEECH LANGUAGE PATHOLOGY TREATMENT   Patient Name: Jon Spencer MRN: [id number] DOB:June 21, 1951, 73 y.o., male Today's Date: 08/15/2024  PCP: [name], MD REFERRING PROVIDER: [name] LABOR, NP [name], B., MD - doc)  END OF SESSION:  End of Session - 08/15/24 1122     Visit Number 7    Number of Visits 17    Date for Recertification  09/01/24    SLP Start Time 0850    SLP Stop Time  0930    SLP Time Calculation (min) 40 min    Activity Tolerance Patient tolerated treatment well              Past Medical History:  Diagnosis Date   ADD 01/17/2010   Complication of anesthesia    takes awhile to wake up   History of back surgery 07/2018   Tumor removal   History of kidney stones    pt. 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/01/2010   OSTEOARTHRITIS, HAND 04/17/2010   TINNITUS 06/26/2009   TRIGGER FINGER 01/18/2009   Past Surgical History:  Procedure Laterality Date   APPENDECTOMY  1969   COLONOSCOPY     LAMINECTOMY N/A 07/12/2018   Procedure: Lumbar three-four Laminectomy for resection of intradural tumor;  Surgeon: Colon Shove, MD;  Location: [facility] OR;  Service: Neurosurgery;  Laterality: N/A;   VASECTOMY  1992   Patient Active Problem List   Diagnosis Date Noted   Dysphagia 05/14/2024   CKD (chronic kidney disease) 05/14/2024   Obesity 05/14/2024   Stroke determined by clinical assessment (HCC) 05/12/2024   Acute ischemic stroke (HCC) 05/12/2024   Aortic valve stenosis 05/05/2023   SOB (shortness of breath) 05/05/2023   Ganglion cyst of flexor tendon sheath of finger of left hand 04/29/2021   De Quervain's tenosynovitis, right 04/25/2020   Schwannoma 07/12/2018   Hypogonadism male 04/10/2011   IMPOTENCE OF ORGANIC ORIGIN 10/01/2010   Osteoarthrosis, hand 04/17/2010   Attention deficit disorder 01/17/2010   Tinnitus 06/26/2009   Hyperlipidemia 01/18/2009    Essential hypertension 01/18/2009   Trigger finger, acquired 01/18/2009     ONSET DATE: 05/12/24   REFERRING DIAG: I63.9 (ICD-10-CM) - Acute CVA (cerebrovascular accident) (HCC)  THERAPY DIAG:  Dysarthria and anarthria  Dysphagia, oropharyngeal phase  Rationale for Evaluation and Treatment: Rehabilitation  SUBJECTIVE:   SUBJECTIVE STATEMENT: Yesterday it started out bad and stayed bad (pt, about his speech).  Pt accompanied by: self  PERTINENT HISTORY: ADD, HLD, HTN, L3/4 laminectomy 2019  PAIN:  Are you having pain? No  FALLS: Has patient fallen in last 6 months?  No  PATIENT GOALS: My voice is my product. Pt would like to see his speech improve.   OBJECTIVE:  Note: Objective measures were completed at Evaluation unless otherwise noted.  DIAGNOSTIC FINDINGS:  MRI - 05/12/24: IMPRESSION: 1. 15 mm acute infarct within the left aspect of the pons. 2. Small cortical/subcortical infarct within the posterior right frontal lobe, late subacute or chronic. 3. Chronic lacunar infarcts within the left corona radiata and within/about the bilateral basal ganglia. 4. Background moderate-to-advanced cerebral white matter chronic small vessel ischemic disease. 5. Mild generalized cerebral atrophy.  MBS 07/06/24: Clinical Impression: Pt presents wtih a mild pharyngeal dysphagia marked primarily by reduced epiglottic inversion over the larynx, leading to: 1) collection of residue above epiglottis - appears to be due to presence of multilevel cervical degenerative changes, inhibiting full closure  of the larynx unless bolus is heavy; 2) occasional aspiration (trace) of liquids - this occurred once each with thin and nectar liquids. It was a miniscule amount and occurred during sequential swallows or when pt was mobilizing head. When he limited sips to single, controlled boluses, avoided head movement, there was no aspiration.  Recommend continuing current diet; crush pills or break in  half when large (barium pill was not adminstered today due to incomplete epiglottic inversion). Continue f/u with OP SLP.   Factors that may increase risk of adverse event in presence of aspiration Noe & [name] 2021):  none   Recommendations/Plan: Swallowing Evaluation Recommendations Swallowing Evaluation Recommendations Recommendations: PO diet PO Diet Recommendation: Regular; Thin liquids (Level 0) Liquid Administration via: Cup; Straw Medication Administration: Crushed with puree Supervision: Patient able to self-feed Swallowing strategies  : Follow solids with liquids; Small bites/sips Oral care recommendations: Oral care BID (2x/day)    PATIENT REPORTED OUTCOME MEASURES (PROM): Communication Effectiveness Survey: Pt responded with 3 (mostly effective) with being part of a conversation in a noisy environment, speaking to a friend when you are emotionally upset or angry, and having a conversation with someone at a distance. Other responses were very effective.                                                                                                                            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 SLP taught pt some voice relaxation techniques. Pt stated the area felt more loose after he went through vocal relaxation with SLP. SLP reviewed pt's labial and lingual exercises with him as he stated he was not as precise as he would like at work. Pt was strongly encouraged to complete regularly if he desires to give himself the best opportunity for improvement.  SWALLOW: SLP reviewed pt's HEP after pt stated, I'm too dumb for that other one, Brack Shaddock. I can't do it. SLP encouraged pt that he has not practiced enough and provided written cues for super supraglottic, which pt compelted with the cues with rare min A. Mendelsohn- pt was only able to hold average 3  seconds and told SLP he couldn't do it. SLP again encouraged pt that he needs to stay consistent with the HEP and after 1-2 weeks will be able to complete with 5 second hold routinely.   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 Pt has incorporated a colleague and his wife to let him know if my speech starts to deteriorate. Nhia states that on a normal day he can go until 4pm without speech fatigue but today is more difficult - pt talked about 50% more than he was expecting. Speech sounds about like it did when he entered for his previous therapy session. SWALLOW: Pt reports less frequent and less significant labial leakage  as well as less-frequent coughing (x3-4/week). Today pt req'd mod cues for completing swallow HEP faded to independent by task end. SLP strongly reiterated pt complete 30 reps/day of each of these exercises and told him rationale. SLP and pt agreed pt could reduce frequency of ST.   07/13/24: SWALLOWING: Reviewed the MBS with pt. He c/o a blob of mucous in his throat at the level of the thyroid  (suspect this is in the vallecula based on results of MBS). Water sips did not clear completely but lessened the amount, teaspoons applesauce and water flushes with effortful swallows also decr'd the amount according to pt but nothing completely eliminated this. Later in session after having a swallow of water, pt hocked and stated the remainder of the mucous appeared to have cleared. SLP suggested pt drink decaf (has two cups of coffee and a cup of hot tea) and then continue his two cups of hot lemonade in order to clear (suspected) secretions from the previous evening.  SLP taught pt Claudette and pt held swallow for approx 1-2 seconds prior to thyroid  relaxation. SLP encouraged pt to cont with this exercise x10 TID, telling pt that he should see improvement in hold times if he is consistent with prescribed reps and frequency.   06/30/24: Given  S SLP discussed voice conservation strategies. SLP told pt he needs to focus more on not talking than when he can talk, given his personality and drive to still do everything he was doing prior to CVA - his changes to voice, speech, and swallowing are evidence of this and still linger as deficits. SLP provided pt with examples of what this might look like at work. Peng described s/sx of pharyngeal dysphagia with peanuts only, and today demonstrated rt anterior labial leakage with saliva. SLP told pt basics of MBS and pt is looking forward to this exam on 07/06/24.  06/22/24: Needs PROM next session.  SPEECH: Given pt's s SLP engaged pt in 12 minutes conversation regarding education for energy conservation and how pt can obtain this during his workday as he feels like he has to be at the office. Pt to implement these strategies. SLP also educated pt he must complete HEP at least 5 days/week as he is now completing 2 days/week.  SWALLOW: Pt reports larger sips still lead to coughing x3-4/day. SLP completed clinical swallow eval today and is recommending a MBSS. Lingual strength decr'd on lt, ROM WNL. Labial strength reduced on rt and pt reports with larger sips will have some anterior labial leakage. ROM WNL.Pt's cough appears weak; Throat clear appears WNL.  Will send over referral for MBSS per pt's therapy plan below.   06/05/24: Not time for clinical swallow assessment today given pt's time constraints. To complete NEXT session. Pt also needs PROM. Pt appears more subdued today reporting that wife told him his voice sounds different in the late afternoons. SLP told pt to record 30 seconds of reading and then read the same passage in late afternoon and bring to therapy for SLP to compare.  Given pt's need to leave in 25 minutes, SLP reviewed pt's HEP with him as he was not able to complete HEP largely due to allergic reaction to Plavix  (face swollen). He req'd usual min A with procedure, and model for air  in cheeks and with overarticulation. He was told to complete HEP BID. SLP educated/re-educated pt about speech exaggeration and provided rationale.   05/29/24: SLP guided pt through a HEP for speech intelligibility and for  oral motor strengthening.  PATIENT EDUCATION: Education details: results of eval, likely need for MBSS, rationale for MBSS, etiology of pt's dysarthria, stamina re: speech  Person educated: Patient Education method: Explanation, Demonstration, Verbal cues, and Handouts Education comprehension: verbalized understanding, returned demonstration, verbal cues required, and needs further education   GOALS: Goals reviewed with patient? Yes, generally  SHORT TERM GOALS: Target date:   08/15/24 (due to visit number)  Pt will undergo clinical swallow assessment, and a MBSS if clinically indicated Baseline: Goal status: met  2.  Pt will complete HEP for dysarthria with mod I in 2 sessions Baseline:  Goal status: deferred  3.  Pt will complete HEP for oral motor strength with mod I in 2 sessions Baseline:  Goal status: INITIAL  4.  Pt will demo speech compensations in sentence responses 90% success in 2 sessions Baseline:  Goal status: INITIAL   LONG TERM GOALS: Target date:  09/01/24  Pt's PROM will improve from initial administration Baseline:  Goal status: not met, continue  2.  Pt will complete HEP for dysarthria with in 2 sessions Baseline:  Goal status: deferred - pt is talking to practice  3.  Pt will complete HEP for oral motor strength in 2 sessions Baseline:  Goal status: not met, continue  4.  Pt will demo speech compensations, sufficient for pt satisfaction about his speech, in 10 minutes mod complex conversation, in 3 sessions Baseline:  Goal status: not met, continue   ASSESSMENT:  CLINICAL IMPRESSION: Recert today. Patient is a 74 y.o. male who was seen today for treatment of speech intelligibility and/or for swallowing after CVA May 12, 2024. STG #2 and LTG #2 deferred. SLP modified pt's STG reporting date and LTG reporting date due. Pt indicates precautions from MBS aren't necessary due to swallowing improved. See treatment date above for today's date for further details on today's session. 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 For additional information re: education see pt education above.    OBJECTIVE IMPAIRMENTS: include dysarthria and dysphagia. These impairments are limiting patient from return to work, household responsibilities, ADLs/IADLs, effectively communicating at home and in community, and safety when swallowing. Factors affecting potential to achieve goals and functional outcome are cooperation/participation level due to HEP completion necessary for improvement. Patient will benefit from skilled SLP services to address above impairments and improve overall function.  REHAB POTENTIAL: Good  PLAN:  SLP FREQUENCY: 1-2x/week  SLP DURATION: 8 weeks  PLANNED INTERVENTIONS: Aspiration precaution training, Pharyngeal strengthening exercises, Diet toleration management , Environmental controls, Trials of upgraded texture/liquids, Cueing hierachy, Internal/external aids, Oral motor exercises, Functional tasks, SLP instruction and feedback, Compensatory strategies, Patient/family education, [phone] Treatment of speech (30 or 45 min) , 92526 Treatment of swallowing function, and [id number] - evaluation of swallowing (clinical assessment).    Jenna Routzahn, CCC-SLP 08/15/2024, 11:22 AM

## 2024-08-22 ENCOUNTER — Encounter

## 2024-08-22 ENCOUNTER — Telehealth: Payer: Self-pay

## 2024-08-22 NOTE — Patient Outreach (Signed)
 Telephone outreach to patient to obtain mRS was successfully completed. MRS= 1  Shereen Gin [facility] Assistant Direct Dial: [phone]  Fax: [phone] Website: [email or web address]

## 2024-08-24 ENCOUNTER — Encounter

## 2024-08-29 ENCOUNTER — Ambulatory Visit: Admitting: Neurology

## 2024-08-29 ENCOUNTER — Encounter: Payer: Self-pay | Admitting: Neurology

## 2024-08-29 VITALS — BP 119/67 | HR 73 | Ht 69.0 in | Wt 190.6 lb

## 2024-08-29 DIAGNOSIS — I639 Cerebral infarction, unspecified: Secondary | ICD-10-CM | POA: Diagnosis not present

## 2024-08-29 DIAGNOSIS — E7849 Other hyperlipidemia: Secondary | ICD-10-CM | POA: Diagnosis not present

## 2024-08-29 DIAGNOSIS — M47812 Spondylosis without myelopathy or radiculopathy, cervical region: Secondary | ICD-10-CM | POA: Insufficient documentation

## 2024-08-29 DIAGNOSIS — M5412 Radiculopathy, cervical region: Secondary | ICD-10-CM | POA: Insufficient documentation

## 2024-08-29 MED ORDER — ROSUVASTATIN CALCIUM 20 MG PO TABS: 20.0000 mg | ORAL_TABLET | Freq: Every day | ORAL | 1 refills | Status: AC

## 2024-08-29 NOTE — Progress Notes (Addendum)
 [facility] [address] [place]. [place] [address] [phone]       OFFICE FOLLOW-UP NOTE  Mr. NEITHAN DAY Date of Birth:  03/13/1951 Medical Record Number:  [id number]   HPI: Mr. Dulworth is a pleasant 73 year old male seen today for initial office follow-up visit following hospital admission for stroke in July 2025.  History is obtained from him and review of electronic medical records and I personally reviewed pertinent available imaging films in PACS.  He has past medical history of hypertension, hyperlipidemia, kidney stones and osteoarthritis.  He presented on 05/12/2024 with sudden onset of slurred speech.  His NIH stroke scale on admission was 3.  He was given IV TNK after discussion of about risk benefits and alternatives.  Code stroke CT head was unremarkable.  CT angiogram of the head and neck showed near occlusive stenosis of the P1 segment of the left posterior cerebral artery, moderate stenosis of the mid basilar artery and moderate to severe stenosis of both internal carotid arteries in the communicating segments.  Patient was kept in the ICU and monitored closely after TNK and remained stable.  MRI scan showed a 15 mm acute infarct in the left pons and a small cortical/subcortical infarct within the posterior right frontal lobe which was felt to be late subacute to chronic.  There are chronic lacunar infarcts also noted in the left corona radiata and bilateral basal ganglia.  Moderate changes of chronic small vessel disease and mild degree of generalized cerebral atrophy.  2D echo showed ejection fraction of 55 to 60% with grade 1 diastolic dysfunction.  LDL cholesterol 140 mg percent and hemoglobin A1c was 5.8.  Patient was on no antithrombotics prior to admission and was started on aspirin  and Plavix  for 3 months and then aspirin  alone.  Patient states he has done well since discharge and made good recovery though is not back to normal.  Speech is mostly normal  only when he is tired and he has to think about the words.  His has occasional tingling involving his right face and arm which is very brief and transient.  He remains on aspirin  and Brilinta  which he is tolerating well with easy bruising but no bleeding.  Plans to undergo colonoscopy in discussions about holding his antiplatelet therapy for the procedure.  He states his gait is good though he is balance is poor.  He said no falls or injuries.  He has learned to walk slowly.  Patient has questions about testosterone  which he had been taking at the time of his stroke.  He has since been asked by his physician to stop it and his hemoglobin has gone up significantly and he has been asked to give blood.  ROS:   14 system review of systems is positive for numbness, tingling, weakness, fatigue, tiredness, poor balance, gait difficulty, bruising all other systems negative  PMH:  Past Medical History:  Diagnosis Date   ADD 01/17/2010   Complication of anesthesia    takes awhile to wake up   History of back surgery 07/2018   Tumor removal   History of kidney stones    pt. 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/01/2010   OSTEOARTHRITIS, HAND 04/17/2010   TINNITUS 06/26/2009   TRIGGER FINGER 01/18/2009    Social History:  Social History   Socioeconomic History   Marital status: Married    Spouse name: Not on file  Number of children: Not on file   Years of education: Not on file   Highest education level: Some college, no degree  Occupational History   Not on file  Tobacco Use   Smoking status: Never   Smokeless tobacco: Never  Vaping Use   Vaping status: Never Used  Substance and Sexual Activity   Alcohol use: Yes    Alcohol/week: 1.0 - 4.0 standard drink of alcohol    Types: 1 - 4 Glasses of wine per week   Drug use: Never   Sexual activity: Not on file  Other Topics Concern   Not on file  Social History  Narrative   Lives with wife.  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  One daughter. 3 step.     Social Drivers of Corporate Investment Banker Strain: Low Risk  (05/19/2022)   Overall Financial Resource Strain (CARDIA)    Difficulty of Paying Living Expenses: Not hard at all  Food Insecurity: No Food Insecurity (05/12/2024)   Hunger Vital Sign    Worried About Running Out of Food in the Last Year: Never true    Ran Out of Food in the Last Year: Never true  Transportation Needs: No Transportation Needs (05/12/2024)   PRAPARE - Administrator, Civil Service (Medical): No    Lack of Transportation (Non-Medical): No  Physical Activity: Unknown (05/19/2022)   Exercise Vital Sign    Days of Exercise per Week: Patient declined    Minutes of Exercise per Session: Not on file  Stress: Stress Concern Present (05/19/2022)   [employer] of Occupational Health - Occupational Stress Questionnaire    Feeling of Stress : To some extent  Social Connections: Socially Integrated (05/13/2024)   Social Connection and Isolation Panel    Frequency of Communication with Friends and Family: Three times a week    Frequency of Social Gatherings with Friends and Family: Once a week    Attends Religious Services: More than 4 times per year    Active Member of [employer] or Organizations: Yes    Attends Engineer, Structural: More than 4 times per year    Marital Status: Married  Catering Manager Violence: Not At Risk (05/13/2024)   Humiliation, Afraid, Rape, and Kick questionnaire    Fear of Current or Ex-Partner: No    Emotionally Abused: No    Physically Abused: No    Sexually Abused: No    Medications:   Current Outpatient Medications on File Prior to Visit  Medication Sig Dispense Refill   alfuzosin  (UROXATRAL ) 10 MG 24 hr tablet Take 10 mg by mouth daily.     amLODipine  (NORVASC ) 10 MG tablet TAKE 1 TABLET BY MOUTH EVERY DAY 90 tablet 0   aspirin  EC 81 MG tablet Take 1 tablet (81 mg  total) by mouth daily. Swallow whole. 30 tablet 12   gabapentin  (NEURONTIN ) 300 MG capsule TAKE 1 CAPSULE BY MOUTH TWICE A DAY 60 capsule 3   losartan  (COZAAR ) 100 MG tablet TAKE 1 TABLET BY MOUTH EVERY DAY 90 tablet 1   Multiple Vitamins-Minerals (MULTIVITAMIN ADULT) CHEW Chew 1 each by mouth daily.     sildenafil  (VIAGRA ) 100 MG tablet Take 1 tablet (100 mg total) by mouth daily as needed for erectile dysfunction. 6 tablet 3   EPINEPHrine  (EPIPEN  2-PAK) 0.3 mg/0.3 mL IJ SOAJ injection Inject 0.3 mg into the muscle as needed for anaphylaxis. 1 each 0   HYDROcodone -acetaminophen  (NORCO/VICODIN) 5-325 MG tablet Take 1-2 tablets by  mouth every 6 (six) hours as needed. 10 tablet 0   mirabegron  ER (MYRBETRIQ ) 50 MG TB24 tablet TAKE 1 TABLET BY MOUTH EVERY DAY 30 tablet 5   testosterone  cypionate (DEPOTESTOSTERONE CYPIONATE) 200 MG/ML injection SMARTSIG:0.3 Milliliter(s) IM Twice a Week (Patient not taking: Reported on 08/29/2024)     No current facility-administered medications on file prior to visit.    Allergies:   Allergies  Allergen Reactions   Plavix  [Clopidogrel ] Anaphylaxis   Tolectin [Tolmetin Sodium] Other (See Comments)    Fever 104, breathing problems   Meloxicam Other (See Comments)    REACTION: Flushed, high temp   Univasc [Moexipril Hydrochloride] Cough    coughing   Clopidogrel  Bisulfate     Physical Exam General: well developed, well nourished pleasant elderly Caucasian male, seated, in no evident distress Head: head normocephalic and atraumatic.  Neck: supple with no carotid or supraclavicular bruits Cardiovascular: regular rate and rhythm, soft ejection systolic murmur.    Musculoskeletal: no deformity Skin:  no rash/petichiae Vascular:  Normal pulses all extremities Vitals:   08/29/24 1007  BP: 119/67  Pulse: 73  SpO2: 99%   Neurologic Exam Mental Status: Awake and fully alert. Oriented to place and time. Recent and remote memory intact. Attention span,  concentration and fund of knowledge appropriate. Mood and affect appropriate.  MMSE score 29/30.  Clock drawing 4/4.  Animal naming test 16.  Geriatric depression scale 1 not depressed.   Cranial Nerves: Fundoscopic exam reveals sharp disc margins. Pupils equal, briskly reactive to light. Extraocular movements full without nystagmus. Visual fields full to confrontation. Hearing intact. Facial sensation intact. Face, tongue, palate moves normally and symmetrically.  Motor: Normal bulk and tone. Normal strength in all tested extremity muscles.  Diminished fine finger movements on the right.  Orbits left or right upper extremity. Sensory.: intact to touch ,pinprick .position and vibratory sensation.  Coordination: Rapid alternating movements normal in all extremities. Finger-to-nose and heel-to-shin performed accurately bilaterally. Gait and Station: Arises from chair without difficulty. Stance is normal. Gait demonstrates normal stride length and balance . Able to heel, toe and tandem walk with moderate difficulty.  Reflexes: 1+ and symmetric. Toes downgoing.   NIHSS  0 Modified Rankin  1    08/29/2024   10:10 AM  MMSE - Mini Mental State Exam  Orientation to time 5  Orientation to Place 5  Registration 3  Attention/ Calculation 4  Recall 3  Language- name 2 objects 2  Language- repeat 1  Language- follow 3 step command 3  Language- read & follow direction 1  Write a sentence 1  Copy design 1  Total score 29     ASSESSMENT: 73 year old Caucasian male with left pontine infarct in July 2025 due to symptomatic moderate basilar artery stenosis.  Also subacute right frontal cortical and subcortical infarct.  Vascular risk factors of hypertension, hyperlipidemia and intracranial atherosclerosis.     PLAN:I had a long d/w patient about his recent Pontine stroke, risk for recurrent stroke/TIAs, personally independently reviewed imaging studies and stroke evaluation results and answered  questions.Continue aspirin  81 mg daily alone now and stop Brillinta  for secondary stroke prevention and maintain strict control of hypertension with blood pressure goal below 130/90, diabetes with hemoglobin A1c goal below 6.5% and lipids with LDL cholesterol goal below 70 mg/dL. I also advised the patient to eat a healthy diet with plenty of whole grains, cereals, fruits and vegetables, exercise regularly and maintain ideal body weight.  I recommend we increase Crestor   dose to 20 mg daily as his LDL cholesterol is suboptimally controlled on recent lipid check.  Followup in the future with me in 1 year or call earlier if necessary.     I personally spent a total of 35 minutes in the care of the patient today including getting/reviewing separately obtained history, performing a medically appropriate exam/evaluation, counseling and educating, placing orders, referring and communicating with other health care professionals, documenting clinical information in the EHR, independently interpreting results, and coordinating care.           Eather Popp, MD Note: This document was prepared with digital dictation and possible smart phrase technology. Any transcriptional errors that result from this process are unintentional

## 2024-08-29 NOTE — Patient Instructions (Addendum)
 I had a long d/w patient about his recent Pontine stroke, risk for recurrent stroke/TIAs, personally independently reviewed imaging studies and stroke evaluation results and answered questions.Continue aspirin  81 mg daily alone now and stop Brillinta  for secondary stroke prevention and maintain strict control of hypertension with blood pressure goal below 130/90, diabetes with hemoglobin A1c goal below 6.5% and lipids with LDL cholesterol goal below 70 mg/dL. I also advised the patient to eat a healthy diet with plenty of whole grains, cereals, fruits and vegetables, exercise regularly and maintain ideal body weight.  I recommend we increase Crestor  dose to 20 mg daily as his LDL cholesterol is suboptimally controlled on recent lipid check.  Followup in the future with me in 1 year or call earlier if necessary.  Stroke Prevention Some medical conditions and behaviors can lead to a higher chance of having a stroke. You can help prevent a stroke by eating healthy, exercising, not smoking, and managing any medical conditions you have. Stroke is a leading cause of functional impairment. Primary prevention is particularly important because a majority of strokes are first-time events. Stroke changes the lives of not only those who experience a stroke but also their family and other caregivers. How can this condition affect me? A stroke is a medical emergency and should be treated right away. A stroke can lead to brain damage and can sometimes be life-threatening. If a person gets medical treatment right away, there is a better chance of surviving and recovering from a stroke. What can increase my risk? The following medical conditions may increase your risk of a stroke: Cardiovascular disease. High blood pressure (hypertension). Diabetes. High cholesterol. Sickle cell disease. Blood clotting disorders (hypercoagulable state). Obesity. Sleep disorders (obstructive sleep apnea). Other risk factors  include: Being older than age 84. Having a history of blood clots, stroke, or mini-stroke (transient ischemic attack, TIA). Genetic factors, such as race, ethnicity, or a family history of stroke. Smoking cigarettes or using other tobacco products. Taking birth control pills, especially if you also use tobacco. Heavy use of alcohol or drugs, especially cocaine and methamphetamine. Physical inactivity. What actions can I take to prevent this? Manage your health conditions High cholesterol levels. Eating a healthy diet is important for preventing high cholesterol. If cholesterol cannot be managed through diet alone, you may need to take medicines. Take any prescribed medicines to control your cholesterol as told by your health care provider. Hypertension. To reduce your risk of stroke, try to keep your blood pressure below 130/80. Eating a healthy diet and exercising regularly are important for controlling blood pressure. If these steps are not enough to manage your blood pressure, you may need to take medicines. Take any prescribed medicines to control hypertension as told by your health care provider. Ask your health care provider if you should monitor your blood pressure at home. Have your blood pressure checked every year, even if your blood pressure is normal. Blood pressure increases with age and some medical conditions. Diabetes. Eating a healthy diet and exercising regularly are important parts of managing your blood sugar (glucose). If your blood sugar cannot be managed through diet and exercise, you may need to take medicines. Take any prescribed medicines to control your diabetes as told by your health care provider. Get evaluated for obstructive sleep apnea. Talk to your health care provider about getting a sleep evaluation if you snore a lot or have excessive sleepiness. Make sure that any other medical conditions you have, such as atrial fibrillation  or atherosclerosis, are  managed. Nutrition Follow instructions from your health care provider about what to eat or drink to help manage your health condition. These instructions may include: Reducing your daily calorie intake. Limiting how much salt (sodium) you use to 1,500 milligrams (mg) each day. Using only healthy fats for cooking, such as olive oil, canola oil, or sunflower oil. Eating healthy foods. You can do this by: Choosing foods that are high in fiber, such as whole grains, and fresh fruits and vegetables. Eating at least 5 servings of fruits and vegetables a day. Try to fill one-half of your plate with fruits and vegetables at each meal. Choosing lean protein foods, such as lean cuts of meat, poultry without skin, fish, tofu, beans, and nuts. Eating low-fat dairy products. Avoiding foods that are high in sodium. This can help lower blood pressure. Avoiding foods that have saturated fat, trans fat, and cholesterol. This can help prevent high cholesterol. Avoiding processed and prepared foods. Counting your daily carbohydrate intake.  Lifestyle If you drink alcohol: Limit how much you have to: 0-1 drink a day for women who are not pregnant. 0-2 drinks a day for men. Know how much alcohol is in your drink. In the [place] one drink equals one 12 oz bottle of beer ( ), one 5 oz glass of wine ( ), or one 1 oz glass of hard liquor (44mL). Do not use any products that contain nicotine or tobacco. These products include cigarettes, chewing tobacco, and vaping devices, such as e-cigarettes. If you need help quitting, ask your health care provider. Avoid secondhand smoke. Do not use drugs. Activity  Try to stay at a healthy weight. Get at least 30 minutes of exercise on most days, such as: Fast walking. Biking. Swimming. Medicines Take over-the-counter and prescription medicines only as told by your health care provider. Aspirin  or blood thinners (antiplatelets or anticoagulants) may be recommended  to reduce your risk of forming blood clots that can lead to stroke. Avoid taking birth control pills. Talk to your health care provider about the risks of taking birth control pills if: You are over 70 years old. You smoke. You get very bad headaches. You have had a blood clot. Where to find more information American Stroke Association: www.strokeassociation.org Get help right away if: You or a loved one has any symptoms of a stroke. BE FAST is an easy way to remember the main warning signs of a stroke: B - Balance. Signs are dizziness, sudden trouble walking, or loss of balance. E - Eyes. Signs are trouble seeing or a sudden change in vision. F - Face. Signs are sudden weakness or numbness of the face, or the face or eyelid drooping on one side. A - Arms. Signs are weakness or numbness in an arm. This happens suddenly and usually on one side of the body. S - Speech. Signs are sudden trouble speaking, slurred speech, or trouble understanding what people say. T - Time. Time to call emergency services. Write down what time symptoms started. You or a loved one has other signs of a stroke, such as: A sudden, severe headache with no known cause. Nausea or vomiting. Seizure. These symptoms may represent a serious problem that is an emergency. Do not wait to see if the symptoms will go away. Get medical help right away. Call your local emergency services (911 in the [place] Do not drive yourself to the hospital. Summary You can help to prevent a stroke by eating healthy, exercising, not smoking, limiting  alcohol intake, and managing any medical conditions you may have. Do not use any products that contain nicotine or tobacco. These include cigarettes, chewing tobacco, and vaping devices, such as e-cigarettes. If you need help quitting, ask your health care provider. Remember BE FAST for warning signs of a stroke. Get help right away if you or a loved one has any of these signs. This information  is not intended to replace advice given to you by your health care provider. Make sure you discuss any questions you have with your health care provider. Document Revised: 09/21/2022 Document Reviewed: 09/21/2022 Elsevier Patient Education  2024 [employer].

## 2024-08-31 ENCOUNTER — Ambulatory Visit

## 2024-08-31 DIAGNOSIS — R1312 Dysphagia, oropharyngeal phase: Secondary | ICD-10-CM

## 2024-08-31 DIAGNOSIS — R471 Dysarthria and anarthria: Secondary | ICD-10-CM | POA: Diagnosis not present

## 2024-08-31 NOTE — Patient Instructions (Addendum)
   TONGUE exercises  --- DO ALL EXERCISES TWICE EACH DAY At least 4 days/week Until Christmas, then x2 days/week for 8 weeks    1. Tongue sweep - Sweep your tongue in the pockets of your mouth - touch every tooth  - five revolutions each way   2.   TUH!     TEA!    TOE!   - 10 times     MAKE THE INITIAL SOUNDS STRONG       KUH!       KEY!      COAL! - 10 times   3. Move a straw from side to side in your mouth - 10 back and forth movements

## 2024-08-31 NOTE — Therapy (Signed)
 OUTPATIENT SPEECH LANGUAGE PATHOLOGY TREATMENT/Discharge   Patient Name: Jon Spencer MRN: [id number] DOB:22-Jul-1951, 73 y.o., male Today's Date: 08/31/2024  PCP: [name], MD REFERRING PROVIDER: [name] LABOR, NP [name], B., MD - doc)  END OF SESSION:  End of Session - 08/31/24 0813     Visit Number 8    Number of Visits 17    Date for Recertification  09/01/24    SLP Start Time 0804    SLP Stop Time  0842    SLP Time Calculation (min) 38 min    Activity Tolerance Patient tolerated treatment well               Past Medical History:  Diagnosis Date   ADD 01/17/2010   Complication of anesthesia    takes awhile to wake up   History of back surgery 07/2018   Tumor removal   History of kidney stones    pt. 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/01/2010   OSTEOARTHRITIS, HAND 04/17/2010   TINNITUS 06/26/2009   TRIGGER FINGER 01/18/2009   Past Surgical History:  Procedure Laterality Date   APPENDECTOMY  1969   COLONOSCOPY     LAMINECTOMY N/A 07/12/2018   Procedure: Lumbar three-four Laminectomy for resection of intradural tumor;  Surgeon: Colon Shove, MD;  Location: [facility] OR;  Service: Neurosurgery;  Laterality: N/A;   VASECTOMY  1992   Patient Active Problem List   Diagnosis Date Noted   Cervical radiculopathy 08/29/2024   Cervical spondylosis 08/29/2024   Dysphagia 05/14/2024   CKD (chronic kidney disease) 05/14/2024   Obesity 05/14/2024   Stroke determined by clinical assessment (HCC) 05/12/2024   Acute ischemic stroke (HCC) 05/12/2024   Aortic valve stenosis 05/05/2023   SOB (shortness of breath) 05/05/2023   Ganglion cyst of flexor tendon sheath of finger of left hand 04/29/2021   De Quervain's tenosynovitis, right 04/25/2020   Schwannoma 07/12/2018   Hypogonadism male 04/10/2011   IMPOTENCE OF ORGANIC ORIGIN 10/01/2010   Osteoarthrosis, hand 04/17/2010   Attention  deficit disorder 01/17/2010   Tinnitus 06/26/2009   Hyperlipidemia 01/18/2009   Essential hypertension 01/18/2009   Trigger finger, acquired 01/18/2009   SPEECH THERAPY DISCHARGE SUMMARY  Visits from Start of Care: 8  Current functional level related to goals / functional outcomes: Pt's speech clarity has improved since evaluation. His swallowing was evaluated using MBS and he is following most of his precautions. See below for more details.    Remaining deficits: Dysarthria, dysphagia   Education / Equipment: See therapy notes for details.    Patient agrees to discharge. Patient goals were partially met. Patient is being discharged due to being pleased with the current functional level..     ONSET DATE: 05/12/24   REFERRING DIAG: I63.9 (ICD-10-CM) - Acute CVA (cerebrovascular accident) (HCC)  THERAPY DIAG:  Dysarthria and anarthria  Dysphagia, oropharyngeal phase  Rationale for Evaluation and Treatment: Rehabilitation  SUBJECTIVE:   SUBJECTIVE STATEMENT: I'm confident in my speech now -but I know that when I get frustrated I am not as clear. He Wendall) said I'm just going to have to dial in my patience.'  Pt accompanied by: self  PERTINENT HISTORY: ADD, HLD, HTN, L3/4 laminectomy 2019  PAIN:  Are you having pain? No  FALLS: Has patient fallen in last 6 months?  No  PATIENT GOALS: My voice is my product. Pt would like to see his speech improve.  OBJECTIVE:  Note: Objective measures were completed at Evaluation unless otherwise noted.  DIAGNOSTIC FINDINGS:  MRI - 05/12/24: IMPRESSION: 1. 15 mm acute infarct within the left aspect of the pons. 2. Small cortical/subcortical infarct within the posterior right frontal lobe, late subacute or chronic. 3. Chronic lacunar infarcts within the left corona radiata and within/about the bilateral basal ganglia. 4. Background moderate-to-advanced cerebral white matter chronic small vessel ischemic disease. 5.  Mild generalized cerebral atrophy.  MBS 07/06/24: Clinical Impression: Pt presents wtih a mild pharyngeal dysphagia marked primarily by reduced epiglottic inversion over the larynx, leading to: 1) collection of residue above epiglottis - appears to be due to presence of multilevel cervical degenerative changes, inhibiting full closure of the larynx unless bolus is heavy; 2) occasional aspiration (trace) of liquids - this occurred once each with thin and nectar liquids. It was a miniscule amount and occurred during sequential swallows or when pt was mobilizing head. When he limited sips to single, controlled boluses, avoided head movement, there was no aspiration.  Recommend continuing current diet; crush pills or break in half when large (barium pill was not adminstered today due to incomplete epiglottic inversion). Continue f/u with OP SLP.   Factors that may increase risk of adverse event in presence of aspiration Noe & [name] 2021):  none   Recommendations/Plan: Swallowing Evaluation Recommendations Swallowing Evaluation Recommendations Recommendations: PO diet PO Diet Recommendation: Regular; Thin liquids (Level 0) Liquid Administration via: Cup; Straw Medication Administration: Crushed with puree Supervision: Patient able to self-feed Swallowing strategies  : Follow solids with liquids; Small bites/sips Oral care recommendations: Oral care BID (2x/day)    PATIENT REPORTED OUTCOME MEASURES (PROM): Communication Effectiveness Survey: Pt responded with 3 (mostly effective) with being part of a conversation in a noisy environment, speaking to a friend when you are emotionally upset or angry, and having a conversation with someone at a distance. Other responses were very effective. Score was 29/32 with higher scores indicating less impact of pt's speech disorder on QoL.                                                                                                                            TREATMENT DATE:   08/31/24: SPEECH:  SLP guided pt through the HEP for dysasrthria and compensatory strategies. Pt rates his speech 85-90% of normal in the morning, 70-75% in the late afternoon. SLP told pt that this number would improve if he has completed exercises routinely for 8 weeks - SLP suggested until Christmas at least 4 days/week and then 2 days/week after that. SLP pared down his exercises to just tongue exercises as lips appear WNL strength today. Pt was independent with exercises for dysarthria and for compensations (Speech) at task end. SWALLOW: Pt takes pills slowly and has to focus but does not have any overt s/sx oropharyngeal dysphagia with them now. Pt states that he takes single sips and attempts smaller sips and bites but enjoys Dr. Nunzio and  that is when he will take larger sips and may rarely exhibit coughing. With  HEP for swallowing pt req'd min A for taking a drop of water for reps. He told SLP he is not going to do supersupraglottic and told SLP it is not necessary to confirm he is doing that correctly. SLP explained rationale for this exercise and pt reiterated he is not going to perform this exercise.  CES was completed today, with score as 26/32 likely due to incr'd awareness of errors since initial score of 29/32. Higher scores indicate less impact of pt's speech disorder on QoL.  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 SLP taught pt some voice relaxation techniques. Pt stated the area felt more loose after he went through vocal relaxation with SLP. SLP reviewed pt's labial and lingual exercises with him as he stated he was not as precise as he would like at work. Pt was strongly encouraged to complete regularly if he desires to give himself the best opportunity for improvement.  SWALLOW: SLP reviewed pt's HEP after pt stated, I'm too dumb for that other one, Giannina Bartolome. I can't do it. SLP  encouraged pt that he has not practiced enough and provided written cues for super supraglottic, which pt compelted with the cues with rare min A. Mendelsohn- pt was only able to hold average 3 seconds and told SLP he couldn't do it. SLP again encouraged pt that he needs to stay consistent with the HEP and after 1-2 weeks will be able to complete with 5 second hold routinely.   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 Pt has incorporated a colleague and his wife to let him know if my speech starts to deteriorate. Hale states that on a normal day he can go until 4pm without speech fatigue but today is more difficult - pt talked about 50% more than he was expecting. Speech sounds about like it did when he entered for his previous therapy session. SWALLOW: Pt reports less frequent and less significant labial leakage as well as less-frequent coughing (x3-4/week). Today pt req'd mod cues for completing swallow HEP faded to independent by task end. SLP strongly reiterated pt complete 30 reps/day of each of these exercises and told him rationale. SLP and pt agreed pt could reduce frequency of ST.   07/13/24: SWALLOWING: Reviewed the MBS with pt. He c/o a blob of mucous in his throat at the level of the thyroid  (suspect this is in the vallecula based on results of MBS). Water sips did not clear completely but lessened the amount, teaspoons applesauce and water flushes with effortful swallows also decr'd the amount according to pt but nothing completely eliminated this. Later in session after having a swallow of water, pt hocked and stated the remainder of the mucous appeared to have cleared. SLP suggested pt drink decaf (has two cups of coffee and a cup of hot tea) and then continue his two cups of hot lemonade in order to clear (suspected) secretions from the previous evening.  SLP taught pt Claudette and pt held swallow for approx 1-2 seconds prior to thyroid   relaxation. SLP encouraged pt to cont with this exercise x10 TID, telling pt that he should see improvement in hold times if he is consistent with prescribed reps and frequency.   06/30/24: Given S SLP discussed voice conservation strategies. SLP told pt he needs  to focus more on not talking than when he can talk, given his personality and drive to still do everything he was doing prior to CVA - his changes to voice, speech, and swallowing are evidence of this and still linger as deficits. SLP provided pt with examples of what this might look like at work. Broghan described s/sx of pharyngeal dysphagia with peanuts only, and today demonstrated rt anterior labial leakage with saliva. SLP told pt basics of MBS and pt is looking forward to this exam on 07/06/24.  06/22/24: Needs PROM next session.  SPEECH: Given pt's s SLP engaged pt in 12 minutes conversation regarding education for energy conservation and how pt can obtain this during his workday as he feels like he has to be at the office. Pt to implement these strategies. SLP also educated pt he must complete HEP at least 5 days/week as he is now completing 2 days/week.  SWALLOW: Pt reports larger sips still lead to coughing x3-4/day. SLP completed clinical swallow eval today and is recommending a MBSS. Lingual strength decr'd on lt, ROM WNL. Labial strength reduced on rt and pt reports with larger sips will have some anterior labial leakage. ROM WNL.Pt's cough appears weak; Throat clear appears WNL.  Will send over referral for MBSS per pt's therapy plan below.   06/05/24: Not time for clinical swallow assessment today given pt's time constraints. To complete NEXT session. Pt also needs PROM. Pt appears more subdued today reporting that wife told him his voice sounds different in the late afternoons. SLP told pt to record 30 seconds of reading and then read the same passage in late afternoon and bring to therapy for SLP to compare.  Given pt's need to  leave in 25 minutes, SLP reviewed pt's HEP with him as he was not able to complete HEP largely due to allergic reaction to Plavix  (face swollen). He req'd usual min A with procedure, and model for air in cheeks and with overarticulation. He was told to complete HEP BID. SLP educated/re-educated pt about speech exaggeration and provided rationale.   05/29/24: SLP guided pt through a HEP for speech intelligibility and for oral motor strengthening.  PATIENT EDUCATION: Education details: results of eval, likely need for MBSS, rationale for MBSS, etiology of pt's dysarthria, stamina re: speech  Person educated: Patient Education method: Explanation, Demonstration, Verbal cues, and Handouts Education comprehension: verbalized understanding, returned demonstration, verbal cues required, and needs further education   GOALS: Goals reviewed with patient? Yes, generally  SHORT TERM GOALS: Target date:   08/15/24 (due to visit number)  Pt will undergo clinical swallow assessment, and a MBSS if clinically indicated Baseline: Goal status: met  2.  Pt will complete HEP for dysarthria with mod I in 2 sessions Baseline:  Goal status: deferred  3.  Pt will complete HEP for oral motor strength with mod I in 2 sessions Baseline:  Goal status: INITIAL  4.  Pt will demo speech compensations in sentence responses 90% success in 2 sessions Baseline:  Goal status: INITIAL   LONG TERM GOALS: Target date:  09/01/24  Pt's PROM will improve from initial administration Baseline:  Goal status: not met, continue  2.  Pt will complete HEP for dysarthria with in 2 sessions Baseline:  Goal status: deferred - pt is talking to practice  3.  Pt will complete HEP for oral motor strength in 2 sessions Baseline: 08/31/24 Goal status: partially met  4.  Pt will demo speech compensations, sufficient for pt  satisfaction about his speech, in 10 minutes mod complex conversation, in 3 sessions Baseline:  08/31/24 Goal status: partially met   ASSESSMENT:  CLINICAL IMPRESSION: Recert today. Patient is a 73 y.o. male who was seen today for treatment of speech intelligibility and/or for swallowing after CVA May 12, 2024. STG #2 and LTG #2 deferred. SLP modified pt's STG reporting date and LTG reporting date due. Pt indicates precautions from MBS aren't necessary due to swallowing improved. See treatment date above for today's date for further details on today's session. 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 For additional information re: education see pt education above.    OBJECTIVE IMPAIRMENTS: include dysarthria and dysphagia. These impairments are limiting patient from return to work, household responsibilities, ADLs/IADLs, effectively communicating at home and in community, and safety when swallowing. Factors affecting potential to achieve goals and functional outcome are cooperation/participation level due to HEP completion necessary for improvement. Patient will benefit from skilled SLP services to address above impairments and improve overall function.  REHAB POTENTIAL: Good  PLAN:  SLP FREQUENCY: 1-2x/week  SLP DURATION: 8 weeks  PLANNED INTERVENTIONS: Aspiration precaution training, Pharyngeal strengthening exercises, Diet toleration management , Environmental controls, Trials of upgraded texture/liquids, Cueing hierachy, Internal/external aids, Oral motor exercises, Functional tasks, SLP instruction and feedback, Compensatory strategies, Patient/family education, [phone] Treatment of speech (30 or 45 min) , 92526 Treatment of swallowing function, and 07389 - evaluation of swallowing (clinical assessment).    Almarie Kurdziel, CCC-SLP 08/31/2024, 8:47 AM

## 2024-09-05 DIAGNOSIS — E291 Testicular hypofunction: Secondary | ICD-10-CM | POA: Diagnosis not present

## 2024-09-05 DIAGNOSIS — R350 Frequency of micturition: Secondary | ICD-10-CM | POA: Diagnosis not present

## 2024-09-05 DIAGNOSIS — N401 Enlarged prostate with lower urinary tract symptoms: Secondary | ICD-10-CM | POA: Diagnosis not present

## 2024-09-05 DIAGNOSIS — R399 Unspecified symptoms and signs involving the genitourinary system: Secondary | ICD-10-CM | POA: Diagnosis not present

## 2024-09-05 DIAGNOSIS — R351 Nocturia: Secondary | ICD-10-CM | POA: Diagnosis not present

## 2024-09-10 ENCOUNTER — Other Ambulatory Visit: Payer: Self-pay | Admitting: Neurology

## 2024-09-22 ENCOUNTER — Ambulatory Visit (INDEPENDENT_AMBULATORY_CARE_PROVIDER_SITE_OTHER): Admitting: Family Medicine

## 2024-09-22 ENCOUNTER — Encounter: Payer: Self-pay | Admitting: Family Medicine

## 2024-09-22 VITALS — BP 126/60 | HR 76 | Temp 97.9°F | Wt 191.0 lb

## 2024-09-22 DIAGNOSIS — R5383 Other fatigue: Secondary | ICD-10-CM | POA: Diagnosis not present

## 2024-09-22 DIAGNOSIS — I10 Essential (primary) hypertension: Secondary | ICD-10-CM | POA: Diagnosis not present

## 2024-09-22 DIAGNOSIS — Z8673 Personal history of transient ischemic attack (TIA), and cerebral infarction without residual deficits: Secondary | ICD-10-CM | POA: Diagnosis not present

## 2024-09-22 DIAGNOSIS — E782 Mixed hyperlipidemia: Secondary | ICD-10-CM

## 2024-09-22 DIAGNOSIS — I350 Nonrheumatic aortic (valve) stenosis: Secondary | ICD-10-CM | POA: Diagnosis not present

## 2024-09-22 LAB — LIPID PANEL
Cholesterol: 107 mg/dL (ref 0–200)
HDL: 49.5 mg/dL (ref >39.00)
LDL Cholesterol: 42 mg/dL (ref 0–99)
NonHDL: 57.52
Total CHOL/HDL Ratio: 2
Triglycerides: 80 mg/dL (ref 0.0–149.0)
VLDL: 16 mg/dL (ref 0.0–40.0)

## 2024-09-22 NOTE — Progress Notes (Signed)
 Established Patient Office Visit  Subjective   Patient ID: Jon Spencer, male    DOB: 05/31/1951  Age: 73 y.o. MRN: [id number]  Chief Complaint  Patient presents with   Fatigue    HPI    Jon Spencer had acute CVA this past summer.  He has continued to slowly recover.  He is back working full-time 40 hours/week.  His major issue now is fatigue.  He had been on testosterone  prior to stroke and he knows some of the fatigue may be related to that.  He also gets up usually 4-5 times at night to urinate and realizes that is probably very likely contributing.  No history of known obstructive sleep apnea.  No observed apnea episodes.  He had TSH a month ago which came back normal.  He is followed by urology and apparently they are looking at some type of acupuncture treatment starting next month to try to help with his urine frequency.  Denies obstructive urinary symptoms.  Currently is on alfuzosin  and is apparently tried medication such as Gemtesa in the past without success.  He has hyperlipidemia with last LDL cholesterol 113.  We increased his rosuvastatin  currently 20 mg daily and tolerating well.  Needs follow-up lipid panel.  Blood pressure currently controlled on amlodipine  and losartan  combination He has history of aortic murmur with most recent echo showing moderate aortic stenosis.  Denies any dizziness or syncope.  Does feel somewhat winded when going up stairs but not on level ground.  Past Medical History:  Diagnosis Date   ADD 01/17/2010   Complication of anesthesia    takes awhile to wake up   History of back surgery 07/2018   Tumor removal   History of kidney stones    pt. 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/01/2010   OSTEOARTHRITIS, HAND 04/17/2010   TINNITUS 06/26/2009   TRIGGER FINGER 01/18/2009   Past Surgical History:  Procedure Laterality Date   APPENDECTOMY  1969   COLONOSCOPY      LAMINECTOMY N/A 07/12/2018   Procedure: Lumbar three-four Laminectomy for resection of intradural tumor;  Surgeon: Colon Shove, MD;  Location: [facility] OR;  Service: Neurosurgery;  Laterality: N/A;   VASECTOMY  1992    reports that he has never smoked. He has never used smokeless tobacco. He reports current alcohol use of about 1.0 - 4.0 standard drink of alcohol per week. He reports that he does not use drugs. family history includes Arthritis in an other family member; Bladder Cancer in his father; Hyperlipidemia in an other family member; Hypertension in an other family member; Stroke in his mother and sister; Sudden death in an other family member. Allergies  Allergen Reactions   Plavix  [Clopidogrel ] Anaphylaxis   Tolectin [Tolmetin Sodium] Other (See Comments)    Fever 104, breathing problems   Meloxicam Other (See Comments)    REACTION: Flushed, high temp   Univasc [Moexipril Hydrochloride] Cough    coughing   Clopidogrel  Bisulfate     Review of Systems  Constitutional:  Positive for malaise/fatigue.  Eyes:  Negative for blurred vision.  Cardiovascular:  Negative for chest pain.  Neurological:  Negative for dizziness, weakness and headaches.      Objective:     BP 126/60   Pulse 76   Temp 97.9 F (36.6 C) (Oral)   Wt 191 lb (86.6 kg)   SpO2 98%   BMI 28.21 kg/m  BP Readings from Last 3 Encounters:  09/22/24 126/60  08/29/24 119/67  07/28/24 120/60   Wt Readings from Last 3 Encounters:  09/22/24 191 lb (86.6 kg)  08/29/24 190 lb 9.6 oz (86.5 kg)  07/28/24 189 lb 6.4 oz (85.9 kg)      Physical Exam Vitals reviewed.  Constitutional:      Appearance: He is well-developed.  HENT:     Right Ear: External ear normal.     Left Ear: External ear normal.  Eyes:     Pupils: Pupils are equal, round, and reactive to light.  Neck:     Thyroid : No thyromegaly.  Cardiovascular:     Rate and Rhythm: Normal rate and regular rhythm.     Heart sounds: Murmur heard.      Comments: Systolic ejection murmur right upper sternal border consistent with his aortic stenosis history Pulmonary:     Effort: Pulmonary effort is normal. No respiratory distress.     Breath sounds: Normal breath sounds. No wheezing or rales.  Musculoskeletal:     Cervical back: Neck supple.  Neurological:     Mental Status: He is alert and oriented to person, place, and time.     Cranial Nerves: No cranial nerve deficit.      No results found for any visits on 09/22/24.  Last CBC Lab Results  Component Value Date   WBC 13.6 (H) 06/02/2024   HGB 13.6 06/02/2024   HCT 42.0 06/02/2024   MCV 89.2 06/02/2024   MCH 28.9 06/02/2024   RDW 14.5 06/02/2024   PLT 248 06/02/2024   Last metabolic panel Lab Results  Component Value Date   GLUCOSE 90 07/28/2024   NA 141 07/28/2024   K 3.8 07/28/2024   CL 106 07/28/2024   CO2 28 07/28/2024   BUN 23 07/28/2024   CREATININE 1.44 07/28/2024   GFR 47.75 (L) 07/28/2024   CALCIUM  9.5 07/28/2024   PROT 6.7 07/28/2024   PROT 6.7 07/28/2024   ALBUMIN 4.2 07/28/2024   ALBUMIN 4.2 07/28/2024   BILITOT 0.5 07/28/2024   BILITOT 0.5 07/28/2024   ALKPHOS 71 07/28/2024   ALKPHOS 71 07/28/2024   AST 14 07/28/2024   AST 14 07/28/2024   ALT 15 07/28/2024   ALT 15 07/28/2024   ANIONGAP 14 06/02/2024   Last lipids Lab Results  Component Value Date   CHOL 107 09/22/2024   HDL 49.50 09/22/2024   LDLCALC 42 09/22/2024   LDLDIRECT 162.6 09/26/2013   TRIG 80.0 09/22/2024   CHOLHDL 2 09/22/2024   Last hemoglobin A1c Lab Results  Component Value Date   HGBA1C 5.8 (H) 05/13/2024   Last thyroid  functions Lab Results  Component Value Date   TSH 1.17 07/28/2024   Last vitamin B12 and Folate No results found for: VITAMINB12, FOLATE    The ASCVD Risk score (Arnett DK, et al., 2019) failed to calculate for the following reasons:   Risk score cannot be calculated because patient has a medical history suggesting prior/existing  ASCVD    Assessment & Plan:   #1 hyperlipidemia.  Goal LDL less than 70 with history of stroke.  Patient on rosuvastatin  20 mg daily.  Recheck fasting lipid today  #2 progressive fatigue.  Suspect at least in part to not any longer taking testosterone  therapy.  He is reluctant to ever start back.  No history of obstructive sleep apnea.  TSH normal about a month ago.  We did discuss the fact that he has very disrupted sleep usually  getting up 4-5 times at night to urinate and is followed by urology.  Suspect disruptive sleep is having a significant role in his fatigue.  #3 aortic stenosis with severely calcified aortic valve with history of moderate stenosis.  Patient denies any history of chest pain or syncope or dizziness.  Does have some shortness of breath with things like stair climbing.  Recommend he get back in with cardiology for at least annual follow-up   No follow-ups on file.    Wolm Scarlet, MD

## 2024-09-23 ENCOUNTER — Ambulatory Visit: Payer: Self-pay | Admitting: Family Medicine

## 2024-10-10 NOTE — Progress Notes (Signed)
   10/10/2024  Patient ID: Jon Spencer, male   DOB: Jul 18, 1951, 73 y.o.   MRN: [id number]  Pharmacy Quality Measure Review  This patient is appearing on a report for being at risk of failing the adherence measure for hypertension (ACEi/ARB) medications this calendar year.   Medication: Losartan  Last fill date: 09/08/24 for 90 day supply  Insurance report was not up to date. No action needed at this time.   Jon VEAR Lindau, PharmD Clinical Pharmacist [phone]

## 2024-11-04 ENCOUNTER — Other Ambulatory Visit: Payer: Self-pay | Admitting: Family Medicine

## 2024-11-06 ENCOUNTER — Telehealth: Payer: Self-pay | Admitting: Neurology

## 2024-11-06 MED ORDER — TOPIRAMATE 25 MG PO TABS: 25.0000 mg | ORAL_TABLET | Freq: Two times a day (BID) | ORAL | 5 refills | Status: AC

## 2024-11-06 NOTE — Telephone Encounter (Signed)
 I talked to the patient and to Dr Rosemarie. Dr Rosemarie has offered Topiramate  25 mg BID if patient agrees and if these are the same symptoms he has had as an effect of his stroke. The patient said overall his tingling symptoms are the same as they were after stroke in October. His face, arm, hand, and sometimes leg are effected. He says every once in awhile the symptoms last longer and he feels a little weak, today was like that for example, but that is an outlier. The rare, more prolonged episodes were also occurring when he saw Dr Rosemarie in October. The tingling usually lasts 5-10 minutes but sometimes could last up to 30 minutes. He understands that for any new symptoms he is to call 911 and not drive himself to the hospital. The patient would like to start Topiramate  25 mg BID. He understands this is a low dose. We discussed some of the possible s/e such as decreased appetite/change in taste or carbonated beverages, dizziness, for examples. He is also aware the medication will include more detailed information and he can have a consultation with the pharmacist upon pickup. Patient's questions were answered and he thanked me for the call. [facility] [place] is preferred [facility].   The patient also asked which OTC medications are best to take for his back pain/arthritis he has. He understands this may be deferred to pcp but will ask Dr Rosemarie as patient wants to be sure meds don't interact or could worsen his chance of having another stroke. I did recommend he avoid any aspirin  containing products or nsaids since he is already on aspirin . He thanked me for the call.
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
## 2024-11-06 NOTE — Addendum Note (Signed)
 Addended by: HILLIARD HEATHER CROME on: 11/06/2024 03:22 PM   Modules accepted: Orders

## 2024-11-06 NOTE — Telephone Encounter (Signed)
 Patient said having tingling from head to toe on right side, gets weak and have to set down. Lasting 10 to 30 minutes, experiencing everyday, Seen PCP month ago, advised brain is healing and that's why experiencing; if continue to seen neurologist.  Patient asking to schedule an appointment

## 2024-11-08 NOTE — Telephone Encounter (Signed)
 Pt returned call following up on his previous call, he had not heard back.  I relayed that a prescription was sent in for topamax  25mg  po bid 11-06-2024 at3:22pm received).  To [facility].  I called and they were at lunch he will contact after 2p.  Pt had taken a gabapentin  300mg  po at bedtime the night before and did help.  I relayed that Dr. Rosemarie informed his pcp and his pcp had responded back.  The gabapentin  did help with his sleep.  Pt is driving for a living.  He will try the topiramate  and let us  know.  He appreciated the information.

## 2024-12-06 ENCOUNTER — Other Ambulatory Visit: Payer: Self-pay | Admitting: Family Medicine

## 2025-02-09 ENCOUNTER — Ambulatory Visit: Admitting: Cardiology

## 2025-08-29 ENCOUNTER — Ambulatory Visit: Admitting: Neurology
